# Patient Record
Sex: Male | Born: 1965 | Race: White | Hispanic: No | Marital: Single | Smoking: Current every day smoker
Health system: Southern US, Community
[De-identification: ages and names within clinical notes are randomized; demographics above are authoritative.]

## PROBLEM LIST (undated history)

## (undated) DIAGNOSIS — I428 Other cardiomyopathies: Secondary | ICD-10-CM

## (undated) DIAGNOSIS — Z87898 Personal history of other specified conditions: Secondary | ICD-10-CM

## (undated) DIAGNOSIS — F1910 Other psychoactive substance abuse, uncomplicated: Secondary | ICD-10-CM

## (undated) DIAGNOSIS — G931 Anoxic brain damage, not elsewhere classified: Secondary | ICD-10-CM

## (undated) DIAGNOSIS — R918 Other nonspecific abnormal finding of lung field: Secondary | ICD-10-CM

## (undated) DIAGNOSIS — Z8709 Personal history of other diseases of the respiratory system: Secondary | ICD-10-CM

## (undated) DIAGNOSIS — J690 Pneumonitis due to inhalation of food and vomit: Secondary | ICD-10-CM

## (undated) DIAGNOSIS — Z8674 Personal history of sudden cardiac arrest: Secondary | ICD-10-CM

## (undated) DIAGNOSIS — G2581 Restless legs syndrome: Secondary | ICD-10-CM

## (undated) HISTORY — DX: Other psychoactive substance abuse, uncomplicated: F19.10

## (undated) HISTORY — DX: Restless legs syndrome: G25.81

## (undated) HISTORY — DX: Other cardiomyopathies: I42.8

## (undated) HISTORY — DX: Personal history of other specified conditions: Z87.898

## (undated) HISTORY — DX: Anoxic brain damage, not elsewhere classified: G93.1

## (undated) HISTORY — DX: Personal history of other diseases of the respiratory system: Z87.09

## (undated) HISTORY — DX: Personal history of sudden cardiac arrest: Z86.74

## (undated) HISTORY — DX: Other nonspecific abnormal finding of lung field: R91.8

## (undated) HISTORY — DX: Pneumonitis due to inhalation of food and vomit: J69.0

---

## 1999-12-11 ENCOUNTER — Emergency Department (HOSPITAL_COMMUNITY): Admission: EM | Admit: 1999-12-11 | Discharge: 1999-12-11 | Payer: Self-pay | Admitting: Emergency Medicine

## 2000-01-01 ENCOUNTER — Emergency Department (HOSPITAL_COMMUNITY): Admission: EM | Admit: 2000-01-01 | Discharge: 2000-01-01 | Payer: Self-pay

## 2000-01-03 ENCOUNTER — Emergency Department (HOSPITAL_COMMUNITY): Admission: EM | Admit: 2000-01-03 | Discharge: 2000-01-03 | Payer: Self-pay | Admitting: Internal Medicine

## 2000-01-03 ENCOUNTER — Encounter: Payer: Self-pay | Admitting: Internal Medicine

## 2000-01-16 ENCOUNTER — Emergency Department (HOSPITAL_COMMUNITY): Admission: EM | Admit: 2000-01-16 | Discharge: 2000-01-16 | Payer: Self-pay | Admitting: Emergency Medicine

## 2000-02-11 ENCOUNTER — Encounter: Payer: Self-pay | Admitting: Emergency Medicine

## 2000-02-11 ENCOUNTER — Emergency Department (HOSPITAL_COMMUNITY): Admission: EM | Admit: 2000-02-11 | Discharge: 2000-02-11 | Payer: Self-pay | Admitting: Emergency Medicine

## 2000-04-03 ENCOUNTER — Emergency Department (HOSPITAL_COMMUNITY): Admission: EM | Admit: 2000-04-03 | Discharge: 2000-04-03 | Payer: Self-pay | Admitting: Emergency Medicine

## 2001-02-15 ENCOUNTER — Emergency Department (HOSPITAL_COMMUNITY): Admission: EM | Admit: 2001-02-15 | Discharge: 2001-02-15 | Payer: Self-pay | Admitting: Emergency Medicine

## 2009-12-14 ENCOUNTER — Emergency Department (HOSPITAL_COMMUNITY): Admission: EM | Admit: 2009-12-14 | Discharge: 2009-12-14 | Payer: Self-pay | Admitting: Family Medicine

## 2011-12-24 ENCOUNTER — Inpatient Hospital Stay (HOSPITAL_COMMUNITY): Payer: Medicaid Other

## 2011-12-24 ENCOUNTER — Inpatient Hospital Stay (HOSPITAL_COMMUNITY)
Admission: EM | Admit: 2011-12-24 | Discharge: 2012-01-19 | DRG: 004 | Disposition: A | Discharge status: Home / Self Care | Payer: Medicaid Other | Source: Ambulatory Visit | Attending: Pulmonary Disease | Admitting: Pulmonary Disease

## 2011-12-24 ENCOUNTER — Emergency Department (HOSPITAL_COMMUNITY): Payer: Medicaid Other

## 2011-12-24 ENCOUNTER — Encounter (HOSPITAL_COMMUNITY): Payer: Self-pay | Admitting: Emergency Medicine

## 2011-12-24 DIAGNOSIS — I82622 Acute embolism and thrombosis of deep veins of left upper extremity: Secondary | ICD-10-CM

## 2011-12-24 DIAGNOSIS — F1410 Cocaine abuse, uncomplicated: Secondary | ICD-10-CM

## 2011-12-24 DIAGNOSIS — A419 Sepsis, unspecified organism: Secondary | ICD-10-CM | POA: Diagnosis present

## 2011-12-24 DIAGNOSIS — F17200 Nicotine dependence, unspecified, uncomplicated: Secondary | ICD-10-CM | POA: Diagnosis present

## 2011-12-24 DIAGNOSIS — E872 Acidosis, unspecified: Secondary | ICD-10-CM

## 2011-12-24 DIAGNOSIS — D696 Thrombocytopenia, unspecified: Secondary | ICD-10-CM

## 2011-12-24 DIAGNOSIS — J690 Pneumonitis due to inhalation of food and vomit: Secondary | ICD-10-CM | POA: Diagnosis present

## 2011-12-24 DIAGNOSIS — J9600 Acute respiratory failure, unspecified whether with hypoxia or hypercapnia: Secondary | ICD-10-CM

## 2011-12-24 DIAGNOSIS — L8992 Pressure ulcer of unspecified site, stage 2: Secondary | ICD-10-CM | POA: Diagnosis present

## 2011-12-24 DIAGNOSIS — G931 Anoxic brain damage, not elsewhere classified: Secondary | ICD-10-CM

## 2011-12-24 DIAGNOSIS — L8991 Pressure ulcer of unspecified site, stage 1: Secondary | ICD-10-CM | POA: Diagnosis present

## 2011-12-24 DIAGNOSIS — R7309 Other abnormal glucose: Secondary | ICD-10-CM | POA: Diagnosis not present

## 2011-12-24 DIAGNOSIS — J189 Pneumonia, unspecified organism: Secondary | ICD-10-CM

## 2011-12-24 DIAGNOSIS — F1220 Cannabis dependence, uncomplicated: Secondary | ICD-10-CM | POA: Diagnosis present

## 2011-12-24 DIAGNOSIS — A413 Sepsis due to Hemophilus influenzae: Principal | ICD-10-CM | POA: Diagnosis present

## 2011-12-24 DIAGNOSIS — I509 Heart failure, unspecified: Secondary | ICD-10-CM | POA: Diagnosis present

## 2011-12-24 DIAGNOSIS — R4182 Altered mental status, unspecified: Secondary | ICD-10-CM

## 2011-12-24 DIAGNOSIS — E46 Unspecified protein-calorie malnutrition: Secondary | ICD-10-CM | POA: Diagnosis present

## 2011-12-24 DIAGNOSIS — F1010 Alcohol abuse, uncomplicated: Secondary | ICD-10-CM | POA: Diagnosis present

## 2011-12-24 DIAGNOSIS — R1310 Dysphagia, unspecified: Secondary | ICD-10-CM

## 2011-12-24 DIAGNOSIS — I469 Cardiac arrest, cause unspecified: Secondary | ICD-10-CM

## 2011-12-24 DIAGNOSIS — L89899 Pressure ulcer of other site, unspecified stage: Secondary | ICD-10-CM | POA: Diagnosis present

## 2011-12-24 DIAGNOSIS — I429 Cardiomyopathy, unspecified: Secondary | ICD-10-CM

## 2011-12-24 DIAGNOSIS — N179 Acute kidney failure, unspecified: Secondary | ICD-10-CM | POA: Diagnosis present

## 2011-12-24 DIAGNOSIS — D649 Anemia, unspecified: Secondary | ICD-10-CM | POA: Diagnosis present

## 2011-12-24 DIAGNOSIS — R7989 Other specified abnormal findings of blood chemistry: Secondary | ICD-10-CM

## 2011-12-24 DIAGNOSIS — E871 Hypo-osmolality and hyponatremia: Secondary | ICD-10-CM | POA: Diagnosis present

## 2011-12-24 DIAGNOSIS — L89109 Pressure ulcer of unspecified part of back, unspecified stage: Secondary | ICD-10-CM | POA: Diagnosis present

## 2011-12-24 DIAGNOSIS — I82629 Acute embolism and thrombosis of deep veins of unspecified upper extremity: Secondary | ICD-10-CM | POA: Diagnosis present

## 2011-12-24 DIAGNOSIS — F1420 Cocaine dependence, uncomplicated: Secondary | ICD-10-CM | POA: Diagnosis present

## 2011-12-24 DIAGNOSIS — L89609 Pressure ulcer of unspecified heel, unspecified stage: Secondary | ICD-10-CM | POA: Diagnosis present

## 2011-12-24 DIAGNOSIS — K7200 Acute and subacute hepatic failure without coma: Secondary | ICD-10-CM | POA: Diagnosis present

## 2011-12-24 DIAGNOSIS — G9349 Other encephalopathy: Secondary | ICD-10-CM | POA: Diagnosis present

## 2011-12-24 DIAGNOSIS — IMO0001 Reserved for inherently not codable concepts without codable children: Secondary | ICD-10-CM

## 2011-12-24 DIAGNOSIS — Z930 Tracheostomy status: Secondary | ICD-10-CM

## 2011-12-24 LAB — CBC
HCT: 47.1 % (ref 39.0–52.0)
HCT: 41.8 % (ref 39.0–52.0)
MCH: 33.5 pg (ref 26.0–34.0)
MCH: 32.9 pg (ref 26.0–34.0)
MCHC: 32.9 g/dL (ref 30.0–36.0)
MCV: 101.7 fL — ABNORMAL HIGH (ref 78.0–100.0)
MCV: 99.1 fL (ref 78.0–100.0)
Platelets: 271 10*3/uL (ref 150–400)
RDW: 13.6 % (ref 11.5–15.5)
RDW: 13.4 % (ref 11.5–15.5)
WBC: 2.4 10*3/uL — ABNORMAL LOW (ref 4.0–10.5)
WBC: 1.3 10*3/uL — CL (ref 4.0–10.5)

## 2011-12-24 LAB — URINALYSIS, ROUTINE W REFLEX MICROSCOPIC
Bilirubin Urine: NEGATIVE
Ketones, ur: NEGATIVE mg/dL
Nitrite: NEGATIVE
Specific Gravity, Urine: 1.022 (ref 1.005–1.030)
Urobilinogen, UA: 1 mg/dL (ref 0.0–1.0)
pH: 5 (ref 5.0–8.0)

## 2011-12-24 LAB — POCT I-STAT 3, ART BLOOD GAS (G3+)
Acid-base deficit: 9 mmol/L — ABNORMAL HIGH (ref 0.0–2.0)
Acid-base deficit: 7 mmol/L — ABNORMAL HIGH (ref 0.0–2.0)
Bicarbonate: 22.4 mEq/L (ref 20.0–24.0)
Bicarbonate: 20.3 mEq/L (ref 20.0–24.0)
O2 Saturation: 93 %
Patient temperature: 100
Patient temperature: 98.6
TCO2: 25 mmol/L (ref 0–100)
TCO2: 22 mmol/L (ref 0–100)
pH, Arterial: 7.09 — CL (ref 7.350–7.450)
pO2, Arterial: 77 mmHg — ABNORMAL LOW (ref 80.0–100.0)

## 2011-12-24 LAB — PROTIME-INR
INR: 1.32 (ref 0.00–1.49)
Prothrombin Time: 16.6 seconds — ABNORMAL HIGH (ref 11.6–15.2)

## 2011-12-24 LAB — COMPREHENSIVE METABOLIC PANEL
AST: 315 U/L — ABNORMAL HIGH (ref 0–37)
Albumin: 2.7 g/dL — ABNORMAL LOW (ref 3.5–5.2)
Alkaline Phosphatase: 72 U/L (ref 39–117)
BUN: 39 mg/dL — ABNORMAL HIGH (ref 6–23)
CO2: 21 mEq/L (ref 19–32)
Chloride: 106 mEq/L (ref 96–112)
GFR calc non Af Amer: 37 mL/min — ABNORMAL LOW (ref >90)
Potassium: 3.7 mEq/L (ref 3.5–5.1)
Total Bilirubin: 0.3 mg/dL (ref 0.3–1.2)

## 2011-12-24 LAB — POCT I-STAT, CHEM 8
BUN: 43 mg/dL — ABNORMAL HIGH (ref 6–23)
Chloride: 107 mEq/L (ref 96–112)
Creatinine, Ser: 2.5 mg/dL — ABNORMAL HIGH (ref 0.50–1.35)
Potassium: 4.6 mEq/L (ref 3.5–5.1)
Sodium: 140 mEq/L (ref 135–145)

## 2011-12-24 LAB — URINE MICROSCOPIC-ADD ON

## 2011-12-24 LAB — GLUCOSE, CAPILLARY
Glucose-Capillary: 105 mg/dL — ABNORMAL HIGH (ref 70–99)
Glucose-Capillary: 64 mg/dL — ABNORMAL LOW (ref 70–99)
Glucose-Capillary: 203 mg/dL — ABNORMAL HIGH (ref 70–99)
Glucose-Capillary: 69 mg/dL — ABNORMAL LOW (ref 70–99)

## 2011-12-24 LAB — LIPASE, BLOOD: Lipase: 24 U/L (ref 11–59)

## 2011-12-24 LAB — DIFFERENTIAL
Basophils Absolute: 0 10*3/uL (ref 0.0–0.1)
Basophils Relative: 0 % (ref 0–1)
Eosinophils Absolute: 0 10*3/uL (ref 0.0–0.7)
Eosinophils Relative: 0 % (ref 0–5)
Monocytes Absolute: 0.1 10*3/uL (ref 0.1–1.0)

## 2011-12-24 LAB — HEPATIC FUNCTION PANEL
Bilirubin, Direct: <0.1 mg/dL (ref 0.0–0.3)
Total Protein: 6.2 g/dL (ref 6.0–8.3)

## 2011-12-24 LAB — RAPID URINE DRUG SCREEN, HOSP PERFORMED
Amphetamines: NOT DETECTED
Opiates: NOT DETECTED

## 2011-12-24 LAB — PHOSPHORUS: Phosphorus: 1.7 mg/dL — ABNORMAL LOW (ref 2.3–4.6)

## 2011-12-24 LAB — CARDIAC PANEL(CRET KIN+CKTOT+MB+TROPI)
CK, MB: 15 ng/mL (ref 0.3–4.0)
Total CK: 1742 U/L — ABNORMAL HIGH (ref 7–232)
Troponin I: 0.78 ng/mL (ref <0.30)

## 2011-12-24 MED ORDER — CISATRACURIUM BESYLATE 10 MG/ML IV SOLN
0.5000 ug/kg/min | INTRAVENOUS | Status: AC
  Administered 2011-12-24: 1 ug/kg/min via INTRAVENOUS
  Filled 2011-12-24: qty 20

## 2011-12-24 MED ORDER — SODIUM CHLORIDE 0.9 % IV SOLN
1.0000 mg/h | INTRAVENOUS | Status: DC
  Administered 2011-12-24: 1 mg/h via INTRAVENOUS
  Administered 2011-12-25 – 2011-12-26 (×2): 2 mg/h via INTRAVENOUS
  Filled 2011-12-24 (×2): qty 10

## 2011-12-24 MED ORDER — HEPARIN SODIUM (PORCINE) 5000 UNIT/ML IJ SOLN
5000.0000 [IU] | Freq: Three times a day (TID) | INTRAMUSCULAR | Status: DC
  Administered 2011-12-25 – 2011-12-29 (×15): 5000 [IU] via SUBCUTANEOUS
  Filled 2011-12-24 (×19): qty 1

## 2011-12-24 MED ORDER — SODIUM CHLORIDE 0.9 % IV SOLN
INTRAVENOUS | Status: DC
  Administered 2011-12-24: 23:00:00 via INTRAVENOUS

## 2011-12-24 MED ORDER — SODIUM CHLORIDE 0.9 % IV SOLN
250.0000 mL | INTRAVENOUS | Status: DC | PRN
  Administered 2011-12-29: 10 mL/h via INTRAVENOUS
  Administered 2012-01-01: 500 mL via INTRAVENOUS
  Administered 2012-01-03: 20 mL/h via INTRAVENOUS

## 2011-12-24 MED ORDER — ASPIRIN 81 MG PO CHEW
324.0000 mg | CHEWABLE_TABLET | Freq: Once | ORAL | Status: AC
  Administered 2011-12-25: 324 mg
  Filled 2011-12-24: qty 4

## 2011-12-24 MED ORDER — LIDOCAINE HCL (CARDIAC) 20 MG/ML IV SOLN
INTRAVENOUS | Status: AC
  Filled 2011-12-24: qty 5

## 2011-12-24 MED ORDER — MIDAZOLAM BOLUS VIA INFUSION
2.0000 mg | INTRAVENOUS | Status: DC | PRN
  Filled 2011-12-24: qty 2

## 2011-12-24 MED ORDER — CISATRACURIUM BOLUS VIA INFUSION: 0.0500 mg/kg | Freq: Once | INTRAVENOUS | Status: AC | PRN

## 2011-12-24 MED ORDER — PANTOPRAZOLE SODIUM 40 MG PO PACK
40.0000 mg | PACK | Freq: Every day | ORAL | Status: DC
  Administered 2011-12-24 – 2012-01-10 (×17): 40 mg
  Filled 2011-12-24 (×20): qty 20

## 2011-12-24 MED ORDER — FENTANYL BOLUS VIA INFUSION
50.0000 ug | INTRAVENOUS | Status: DC | PRN
  Filled 2011-12-24: qty 50

## 2011-12-24 MED ORDER — DEXTROSE 50 % IV SOLN
INTRAVENOUS | Status: AC
  Administered 2011-12-24: 50 mL
  Filled 2011-12-24: qty 50

## 2011-12-24 MED ORDER — SODIUM CHLORIDE 0.9 % IV SOLN
1.0000 ug/kg/min | INTRAVENOUS | Status: DC
  Administered 2011-12-24 – 2011-12-26 (×2): 1 ug/kg/min via INTRAVENOUS
  Filled 2011-12-24: qty 20

## 2011-12-24 MED ORDER — ETOMIDATE 2 MG/ML IV SOLN
INTRAVENOUS | Status: AC
  Filled 2011-12-24: qty 20

## 2011-12-24 MED ORDER — SUCCINYLCHOLINE CHLORIDE 20 MG/ML IJ SOLN
INTRAMUSCULAR | Status: AC
  Filled 2011-12-24: qty 10

## 2011-12-24 MED ORDER — CHLORHEXIDINE GLUCONATE 0.12 % MT SOLN
15.0000 mL | Freq: Two times a day (BID) | OROMUCOSAL | Status: DC
  Administered 2011-12-24 – 2012-01-07 (×26): 15 mL via OROMUCOSAL
  Filled 2011-12-24 (×25): qty 15

## 2011-12-24 MED ORDER — MIDAZOLAM HCL 2 MG/2ML IJ SOLN
INTRAMUSCULAR | Status: AC
  Filled 2011-12-24: qty 2

## 2011-12-24 MED ORDER — VANCOMYCIN HCL IN DEXTROSE 1-5 GM/200ML-% IV SOLN
1000.0000 mg | Freq: Every day | INTRAVENOUS | Status: DC
  Administered 2011-12-25 – 2011-12-28 (×5): 1000 mg via INTRAVENOUS
  Filled 2011-12-24 (×6): qty 200

## 2011-12-24 MED ORDER — CISATRACURIUM BESYLATE 2 MG/ML IV SOLN: 0.1000 mg/kg | Freq: Once | INTRAVENOUS | Status: DC

## 2011-12-24 MED ORDER — NOREPINEPHRINE BITARTRATE 1 MG/ML IJ SOLN
2.0000 ug/min | INTRAVENOUS | Status: AC
  Administered 2011-12-24: 5 ug/min via INTRAVENOUS
  Administered 2011-12-25: 40 ug/min via INTRAVENOUS
  Administered 2011-12-25: 25 ug/min via INTRAVENOUS
  Administered 2011-12-25: 30 ug/min via INTRAVENOUS
  Filled 2011-12-24: qty 4

## 2011-12-24 MED ORDER — VANCOMYCIN HCL IN DEXTROSE 1-5 GM/200ML-% IV SOLN: 1000.0000 mg | Freq: Once | INTRAVENOUS | Status: DC

## 2011-12-24 MED ORDER — NALOXONE HCL 1 MG/ML IJ SOLN
INTRAMUSCULAR | Status: AC
  Filled 2011-12-24: qty 2

## 2011-12-24 MED ORDER — VECURONIUM BROMIDE 10 MG IV SOLR
INTRAVENOUS | Status: AC | PRN
  Administered 2011-12-24: 10 mg via INTRAVENOUS

## 2011-12-24 MED ORDER — BIOTENE DRY MOUTH MT LIQD
15.0000 mL | Freq: Four times a day (QID) | OROMUCOSAL | Status: DC
  Administered 2011-12-25 – 2012-01-10 (×61): 15 mL via OROMUCOSAL

## 2011-12-24 MED ORDER — MIDAZOLAM HCL 5 MG/5ML IJ SOLN
INTRAMUSCULAR | Status: AC | PRN
  Administered 2011-12-24: 2 mg via INTRAVENOUS

## 2011-12-24 MED ORDER — VECURONIUM BROMIDE 10 MG IV SOLR
INTRAVENOUS | Status: AC
  Filled 2011-12-24: qty 10

## 2011-12-24 MED ORDER — SODIUM CHLORIDE 0.9 % IV BOLUS (SEPSIS)
1000.0000 mL | Freq: Once | INTRAVENOUS | Status: AC
  Administered 2011-12-24: 1000 mL via INTRAVENOUS

## 2011-12-24 MED ORDER — SODIUM CHLORIDE 0.9 % IV SOLN
Freq: Once | INTRAVENOUS | Status: AC
  Administered 2011-12-24: 20:00:00 via INTRAVENOUS

## 2011-12-24 MED ORDER — SODIUM CHLORIDE 0.9 % IV SOLN
1.0000 mg/h | INTRAVENOUS | Status: AC
  Administered 2011-12-24: 1 mg/h via INTRAVENOUS
  Filled 2011-12-24: qty 10

## 2011-12-24 MED ORDER — SODIUM CHLORIDE 0.9 % IV SOLN
20.0000 ug/h | INTRAVENOUS | Status: AC
  Administered 2011-12-24: 25 ug/h via INTRAVENOUS
  Filled 2011-12-24: qty 50

## 2011-12-24 MED ORDER — EPINEPHRINE HCL 0.1 MG/ML IJ SOLN
INTRAMUSCULAR | Status: AC
  Filled 2011-12-24: qty 20

## 2011-12-24 MED ORDER — PIPERACILLIN SOD-TAZOBACTAM SO 2.25 (2-0.25) G IV SOLR
3.3750 g | INTRAVENOUS | Status: AC
  Administered 2011-12-24: 3.375 g via INTRAVENOUS
  Filled 2011-12-24: qty 3.38

## 2011-12-24 MED ORDER — ARTIFICIAL TEARS OP OINT
1.0000 "application " | TOPICAL_OINTMENT | Freq: Three times a day (TID) | OPHTHALMIC | Status: DC
  Administered 2011-12-25 – 2011-12-26 (×5): 1 via OPHTHALMIC
  Filled 2011-12-24: qty 3.5

## 2011-12-24 MED ORDER — ROCURONIUM BROMIDE 50 MG/5ML IV SOLN
INTRAVENOUS | Status: AC
  Filled 2011-12-24: qty 2

## 2011-12-24 MED ORDER — CISATRACURIUM BESYLATE 2 MG/ML IV SOLN: 0.1000 mg/kg | Freq: Once | INTRAVENOUS | Status: AC | PRN

## 2011-12-24 MED ORDER — SODIUM CHLORIDE 0.9 % IV SOLN
25.0000 ug/h | INTRAVENOUS | Status: DC
  Administered 2011-12-24: 25 ug/h via INTRAVENOUS
  Administered 2011-12-26: 75 ug/h via INTRAVENOUS
  Filled 2011-12-24: qty 50

## 2011-12-24 MED ORDER — PIPERACILLIN-TAZOBACTAM 3.375 G IVPB
3.3750 g | Freq: Three times a day (TID) | INTRAVENOUS | Status: DC
  Administered 2011-12-25 – 2011-12-31 (×19): 3.375 g via INTRAVENOUS
  Filled 2011-12-24 (×22): qty 50

## 2011-12-24 NOTE — Code Documentation (Signed)
Per EDP, patient to be taken to CT scanner after central line is placed.

## 2011-12-24 NOTE — ED Notes (Signed)
Patient has been placed on Arctic Sun pads prior to taking patient to CT; per admitting MD, to start [facility] machine upon arrival to 2900.  Report given to [place] on 2900; no other questions/concerns from RN; informed RN that she can call back with any questions once patient arrives to floor.  Patient to be transferred to 2900 with RNs, nurse tech, and respiratory after CT done.

## 2011-12-24 NOTE — ED Notes (Signed)
Patient being transported to CT

## 2011-12-24 NOTE — Code Documentation (Signed)
CBG 105 

## 2011-12-24 NOTE — Code Documentation (Signed)
Dr. Gilford Rile at bedside with central line cart.

## 2011-12-24 NOTE — ED Notes (Signed)
Per EMS, patient had witness seizure activity 30 minutes prior to calling EMS; upon EMS arrival, patient found unresponsive.  Patient had agonal respirations and pulseless -- initial rhythm PEA at 20 bpm.  2 epi given, 2 mg Narcan given, and 1/2 amp D50 given.  12 lead shows 1 mm of elevation.  EMS started hypothermia in field; started with iced saline.  5 mg Versed also given.  Patient now sinus tach on monitor.

## 2011-12-24 NOTE — Procedures (Signed)
Central Venous Catheter Insertion Procedure Note   Procedure: Insertion of Right Central Venous Catheter  Indications: Assessment of intravascular volume, Drug and/or fluid administration and Frequent blood sampling  Procedure Details  Consent: Unable to obtain consent because of emergent medical necessity.  Time Out: Verified patient identification, verified procedure, site/side was marked, verified correct patient position, special equipment/implants available, medications/allergies/relevent history reviewed, required imaging and test results available. Performed  Maximum sterile technique was used including antiseptics, cap, gloves, gown, hand hygiene, mask and sheet.  Skin prep: Chlorhexidine; local anesthetic administered  A antimicrobial bonded/coated triple lumen catheter was placed in the left internal jugular vein using the Seldinger technique.  Evaluation  Blood flow good  Complications: No apparent complications  Patient did tolerate procedure well.  Chest X-ray ordered to verify placement. CXR: pending.  U/S used in placement, picture in chart.  I have personally supervised the procedure. Catha Brow, MD

## 2011-12-24 NOTE — Code Documentation (Signed)
Pharmacy called about [facility] Medications; EDP ordered initial dose 2 mg IVP midazolam and continuous dose midazolam 1 mg/hr.  Asked EDP about analgesia orders; EDP does not want Fentanyl.  Pharmacy states that one cannot be given without the other; both Fentanyl and Midazolam being sent down by pharmacy.

## 2011-12-24 NOTE — Code Documentation (Signed)
Iced saline being infused.

## 2011-12-24 NOTE — Progress Notes (Signed)
PHARMACY - ANTIBIOTIC CONSULT  Initial Consult Note  Pharmacy Consult for: Vancomycin, Zosyn  Indication: rule out pneumonia   Patient Data:   Allergies: No Known Allergies  Patient Measurements: Height: 6' (182.9 cm) Weight: 154 lb 5.2 oz (70 kg) IBW/kg (Calculated) : 77.6    Vital Signs: Temp:  [98.6 F (37 C)-100.7 F (38.2 C)] 98.8 F (37.1 C) (04/21 2230) Pulse Rate:  [99-120] 99  (04/21 2245) Resp:  [20-30] 30  (04/21 2245) BP: (95-127)/(56-90) 104/65 mmHg (04/21 2220) SpO2:  [93 %-100 %] 100 % (04/21 2245) Arterial Line BP: (138-190)/(88-132) 190/132 mmHg (04/21 2230) FiO2 (%):  [100 %] 100 % (04/21 2245) Weight:  [154 lb 5.2 oz (70 kg)] 154 lb 5.2 oz (70 kg) (04/21 2220)  BMI: Estimated Body mass index is 20.93 kg/(m^2) as calculated from the following:   Height as of this encounter: 6\' 0" (1.829 m).   Weight as of this encounter: 154 lb 5.2 oz(70 kg).  Intake/Output from previous day: No intake or output data in the 24 hours ending 12/24/11 2253  Labs:  Basename 12/24/11 2025 12/24/11 2009  WBC -- 2.4*  HGB 16.3 15.5  PLT -- 271  LABCREA -- --  CREATININE 2.50* --   Estimated Creatinine Clearance: 36.6 ml/min (by C-G formula based on Cr of 2.5). No results found for this basename: VANCOTROUGH:2,VANCOPEAK:2,VANCORANDOM:2,GENTTROUGH:2,GENTPEAK:2,GENTRANDOM:2,TOBRATROUGH:2,TOBRAPEAK:2,TOBRARND:2,AMIKACINPEAK:2,AMIKACINTROU:2,AMIKACIN:2, in the last 72 hours   Microbiology: No results found for this or any previous visit (from the past 720 hour(s)).  Medical History: Past Medical History  Diagnosis Date  . No significant past medical history     Scheduled Medications:     . sodium chloride   Intravenous Once  . antiseptic oral rinse  15 mL Mouth Rinse QID  . chlorhexidine  15 mL Mouth Rinse BID  . cisatracurium (NIMBEX) infusion  0.5-10 mcg/kg/min (Order-Specific) Intravenous To Major  . dextrose      . EPINEPHrine      . fentaNYL infusion  INTRAVENOUS  20 mcg/hr Intravenous To ER  . heparin  5,000 Units Subcutaneous Q8H  . midazolam (VERSED) infusion  1 mg/hr Intravenous To ER  . midazolam      . naloxone      . norepinephrine (LEVOPHED) Adult infusion  2-50 mcg/min Intravenous To ER  . pantoprazole sodium  40 mg Per Tube Q1200  . piperacillin-tazobactam (ZOSYN)  IV  3.375 g Intravenous To Major  . sodium chloride  1,000 mL Intravenous Once  . vancomycin  1,000 mg Intravenous Once  . vecuronium        Assessment:  46 y.o. male with cardiac arrest in field, therapeutic hypothermia initiated. Pharmacy consulted to manage Zosyn and vancomycin for possible aspiration pneumonia. Confirmed with RN, that only Zosyn given in ED.   Goal of Therapy:  1. Vancomycin trough level 15-20 mcg/ml  Plan:  1. Zosyn 3.375gm IV Q8H (4 hr infusion). 2. Vancomycin 1gm IV Q24H.  Dineen Kid Thad Ranger, PharmD 12/24/2011, 10:53 PM

## 2011-12-24 NOTE — H&P (Signed)
Name: Angel [name] MRN: [id number] DOB: 08-06-1966    LOS: 0  PCCM ADMISSION NOTE  History of Present Illness:  46 YO man with no past medical history. His family states that he abuses some perscription drugs and some occasional alcohol. Smokes 2 PPD history. Daughter tried to call him all day but he was sleeping which is unusual for him. His friend found him shaking violently around 8 PM and called 911 who intubated in the field for agonal breathing. He was unresponsive at the time. He was brought to the ED and placed on hypothermia protocol. No ischemic EKG changes on ED arrival. UDS positive for cocaine and THC.   Lines / Drains: 4/21 ETT 4/21 CVC  Cultures: Blood 4/21 Urine 4/21  Antibiotics: Zosyn 4/21 Vancomycin 4/21  Tests / Events: 4/21 Brought to ED intubated in field, found unresponsive, UDS + cocaine 4/21 Started artic sun protocol.  The patient is sedated, intubated and unable to provide history, which was obtained for available medical records.    Past Medical History  Diagnosis Date  . No significant past medical history    History reviewed. No pertinent past surgical history. Prior to Admission medications   Not on File   Allergies No Known Allergies  Family History History reviewed. No pertinent family history.  Social History  does not have a smoking history on file. He does not have any smokeless tobacco history on file. His alcohol and drug histories not on file. 2 PPD for many years  Review Of Systems  Patient unable to provide  Vital Signs: Temp:  [99.9 F (37.7 C)-100.7 F (38.2 C)] 99.9 F (37.7 C) (04/21 2113) Pulse Rate:  [101-120] 107  (04/21 2113) Resp:  [20-30] 30  (04/21 2113) BP: (95-127)/(56-90) 127/90 mmHg (04/21 2113) SpO2:  [93 %-99 %] 99 % (04/21 2113) FiO2 (%):  [100 %] 100 % (04/21 2113)    Physical Examination: General:  Intubated, mechanically ventilated, no acute distress Neuro:  Sedated, synchronous,  nonfocal HEENT:  PERRL, pink conjunctivae, moist membranes Neck:  Supple, no JVD   Cardiovascular:  RRR, no M/R/G Lungs:  Bilateral diminished air entry, no W/R/R Abdomen:  Soft, nontender, nondistended, bowel sounds present Musculoskeletal:  paralyzed, no pedal edema Skin:  No rash  Ventilator settings: Vent Mode:  [-] PRVC FiO2 (%):  [100 %] 100 % Set Rate:  [30 bmp] 30 bmp Vt Set:  [086 mL] 620 mL PEEP:  [5 cmH20] 5 cmH20 Pressure Support:  [0 cmH20] 0 cmH20 Plateau Pressure:  [22 cmH20] 22 cmH20  Labs and Imaging:  Reviewed.  Please refer to the Assessment and Plan section for relevant results.  ASSESSMENT AND PLAN  NEUROLOGIC A:  Altered mental status Acute respiratory encephalopathy Drug Abuse P: -->  Versed / Fentanyl gtt to goal RASS -3 -->Paralyzed -->Artic sun -->Daily WUA -->Cocaine positive  PULMONARY  Lab 12/24/11 2040  PHART 7.090*  PCO2ART 74.7*  PO2ART 97.0  HCO3 22.4  O2SAT 93.0   A:  Acute Respiratory Failure Possible Aspiration Pneumonia P: -->  Full mechanical support -->  Goal pH>7.30, goal SpO2>92 -->  Follow up ABG -->  Follow up CXR -->  Bronchodilators -->  Daily SBT  CARDIOVASCULAR  Lab 12/24/11 2012 12/24/11 2011  TROPONINI <0.30 --  LATICACIDVEN -- 9.1*  PROBNP -- --   A:  Cardiac arrest in field - cocaine positive, likely secondary to drug overdose. Will trend enzymes and order 2 D echo. Arctic sun protocol.  P: -->  NS boluses to goal CVP>10 -->  Levophed to goal MAP>60 -->  Trend sepsis markers -->  Cardiac enzymes --> 2 D echo --> Arctic sun protocol  RENAL  Lab 12/24/11 2025  NA 140  K 4.6  CL 107  CO2 --  BUN 43*  CREATININE 2.50*  CALCIUM --  MG --  PHOS --   A:  AKI - Cr. 2.5 on admit, baseline unknown. Will watch on fluids. Likely pre-renal. P: -->  BMP -->  NS 75 cc/hr  GASTROINTESTINAL  Lab 12/24/11 2009  AST 187*  ALT 190*  ALKPHOS 106  BILITOT 0.2*  PROT 6.2  ALBUMIN 3.3*   A:  No  active issues P: -->  Continue to monitor closely, protonix for SUP.  HEMATOLOGIC  Lab 12/24/11 2025 12/24/11 2009  HGB 16.3 15.5  HCT 48.0 47.1  PLT -- 271  INR -- 1.32  APTT -- --   A:  Leukopenia - possibly related to material used to cut cocaine. Will check HIV test, hepatitis panel. P: -->  CBC in AM -->  HIV test -->Hepatitis panel  INFECTIOUS  Lab 12/24/11 2009  WBC 2.4*  PROCALCITON --   A:  Possible aspiration pneumonia - From unresponsive period P: -->  Vancomycin and Zosyn. Follow chest x-ray.  ENDOCRINE  Lab 12/24/11 2019  GLUCAP 105*   A:  No history of diabetes P: -->  CBG checks / SSI -->  Check cortisol level now.  BEST PRACTICE / DISPOSITION -->  ICU status under PCCM -->  Full code -->  NPO -->  Heparin [place] for DVT Px -->  Protonix IV for GI Px -->  Ventilator bundle -->  Family updated at bedside   Genella Mech 12/24/2011, 9:45 PM PGY-1   Patient seen with resident. I examined the patient. Labs reviewed. CT head pending. Repeat ABG better, Chest x-ray shows bilateral infiltrate possible aspiration, cont. Antibiotics. Cardiology consult for possible cardiac cath, although with positive cocaine in the urine less likely to be cathed. Rest of the plan as above.

## 2011-12-24 NOTE — Code Documentation (Signed)
Pharmacy given labeled [facility] protocol sheet filled out by EDP.

## 2011-12-24 NOTE — Code Documentation (Signed)
EDP at bedside deciding on orders from [facility] Protocol.

## 2011-12-24 NOTE — ED Notes (Signed)
Pharmacy called for [facility] medication

## 2011-12-24 NOTE — Code Documentation (Signed)
Calling report now to [phone].

## 2011-12-24 NOTE — Code Documentation (Signed)
MD at bedside inserting central line; Arctic Sun medications being started at this time.

## 2011-12-25 ENCOUNTER — Encounter (HOSPITAL_COMMUNITY): Payer: Self-pay | Admitting: *Deleted

## 2011-12-25 ENCOUNTER — Inpatient Hospital Stay (HOSPITAL_COMMUNITY): Payer: Medicaid Other

## 2011-12-25 DIAGNOSIS — F1410 Cocaine abuse, uncomplicated: Secondary | ICD-10-CM

## 2011-12-25 DIAGNOSIS — G931 Anoxic brain damage, not elsewhere classified: Secondary | ICD-10-CM

## 2011-12-25 DIAGNOSIS — R4182 Altered mental status, unspecified: Secondary | ICD-10-CM

## 2011-12-25 DIAGNOSIS — I469 Cardiac arrest, cause unspecified: Secondary | ICD-10-CM

## 2011-12-25 DIAGNOSIS — J189 Pneumonia, unspecified organism: Secondary | ICD-10-CM

## 2011-12-25 DIAGNOSIS — E872 Acidosis: Secondary | ICD-10-CM

## 2011-12-25 DIAGNOSIS — J9600 Acute respiratory failure, unspecified whether with hypoxia or hypercapnia: Secondary | ICD-10-CM

## 2011-12-25 LAB — POCT I-STAT 3, ART BLOOD GAS (G3+)
Acid-base deficit: 11 mmol/L — ABNORMAL HIGH (ref 0.0–2.0)
Acid-base deficit: 11 mmol/L — ABNORMAL HIGH (ref 0.0–2.0)
Acid-base deficit: 12 mmol/L — ABNORMAL HIGH (ref 0.0–2.0)
Acid-base deficit: 11 mmol/L — ABNORMAL HIGH (ref 0.0–2.0)
Bicarbonate: 16.6 mEq/L — ABNORMAL LOW (ref 20.0–24.0)
Bicarbonate: 16 mEq/L — ABNORMAL LOW (ref 20.0–24.0)
O2 Saturation: 85 %
O2 Saturation: 92 %
O2 Saturation: 98 %
O2 Saturation: 96 %
Patient temperature: 33
Patient temperature: 33.1
Patient temperature: 32.9
Patient temperature: 33.1
Patient temperature: 33
TCO2: 18 mmol/L (ref 0–100)
pCO2 arterial: 34.2 mmHg — ABNORMAL LOW (ref 35.0–45.0)
pH, Arterial: 7.242 — ABNORMAL LOW (ref 7.350–7.450)
pH, Arterial: 7.278 — ABNORMAL LOW (ref 7.350–7.450)
pO2, Arterial: 160 mmHg — ABNORMAL HIGH (ref 80.0–100.0)

## 2011-12-25 LAB — GLUCOSE, CAPILLARY
Glucose-Capillary: 106 mg/dL — ABNORMAL HIGH (ref 70–99)
Glucose-Capillary: 134 mg/dL — ABNORMAL HIGH (ref 70–99)
Glucose-Capillary: 180 mg/dL — ABNORMAL HIGH (ref 70–99)
Glucose-Capillary: 145 mg/dL — ABNORMAL HIGH (ref 70–99)
Glucose-Capillary: 152 mg/dL — ABNORMAL HIGH (ref 70–99)
Glucose-Capillary: 127 mg/dL — ABNORMAL HIGH (ref 70–99)
Glucose-Capillary: 129 mg/dL — ABNORMAL HIGH (ref 70–99)
Glucose-Capillary: 148 mg/dL — ABNORMAL HIGH (ref 70–99)
Glucose-Capillary: 109 mg/dL — ABNORMAL HIGH (ref 70–99)
Glucose-Capillary: 122 mg/dL — ABNORMAL HIGH (ref 70–99)

## 2011-12-25 LAB — BASIC METABOLIC PANEL
BUN: 37 mg/dL — ABNORMAL HIGH (ref 6–23)
BUN: 37 mg/dL — ABNORMAL HIGH (ref 6–23)
BUN: 38 mg/dL — ABNORMAL HIGH (ref 6–23)
BUN: 39 mg/dL — ABNORMAL HIGH (ref 6–23)
CO2: 18 mEq/L — ABNORMAL LOW (ref 19–32)
CO2: 17 mEq/L — ABNORMAL LOW (ref 19–32)
CO2: 16 mEq/L — ABNORMAL LOW (ref 19–32)
CO2: 16 mEq/L — ABNORMAL LOW (ref 19–32)
Calcium: 6.9 mg/dL — ABNORMAL LOW (ref 8.4–10.5)
Calcium: 6.8 mg/dL — ABNORMAL LOW (ref 8.4–10.5)
Calcium: 7 mg/dL — ABNORMAL LOW (ref 8.4–10.5)
Calcium: 7 mg/dL — ABNORMAL LOW (ref 8.4–10.5)
Chloride: 105 mEq/L (ref 96–112)
Chloride: 108 mEq/L (ref 96–112)
Chloride: 108 mEq/L (ref 96–112)
Creatinine, Ser: 1.21 mg/dL (ref 0.50–1.35)
Creatinine, Ser: 1.24 mg/dL (ref 0.50–1.35)
Creatinine, Ser: 1.27 mg/dL (ref 0.50–1.35)
Creatinine, Ser: 1.76 mg/dL — ABNORMAL HIGH (ref 0.50–1.35)
GFR calc Af Amer: 52 mL/min — ABNORMAL LOW (ref >90)
GFR calc Af Amer: 79 mL/min — ABNORMAL LOW (ref >90)
GFR calc non Af Amer: 45 mL/min — ABNORMAL LOW (ref >90)
GFR calc non Af Amer: 51 mL/min — ABNORMAL LOW (ref >90)
GFR calc non Af Amer: 66 mL/min — ABNORMAL LOW (ref >90)
Glucose, Bld: 164 mg/dL — ABNORMAL HIGH (ref 70–99)
Glucose, Bld: 180 mg/dL — ABNORMAL HIGH (ref 70–99)
Glucose, Bld: 146 mg/dL — ABNORMAL HIGH (ref 70–99)
Glucose, Bld: 127 mg/dL — ABNORMAL HIGH (ref 70–99)
Potassium: 2.8 mEq/L — ABNORMAL LOW (ref 3.5–5.1)
Potassium: 3.3 mEq/L — ABNORMAL LOW (ref 3.5–5.1)
Potassium: 3.8 mEq/L (ref 3.5–5.1)
Potassium: 4.3 mEq/L (ref 3.5–5.1)
Sodium: 134 mEq/L — ABNORMAL LOW (ref 135–145)
Sodium: 137 mEq/L (ref 135–145)

## 2011-12-25 LAB — CBC
HCT: 42 % (ref 39.0–52.0)
Hemoglobin: 14.3 g/dL (ref 13.0–17.0)
MCV: 96.3 fL (ref 78.0–100.0)
RBC: 4.36 MIL/uL (ref 4.22–5.81)
WBC: 1.6 10*3/uL — ABNORMAL LOW (ref 4.0–10.5)

## 2011-12-25 LAB — CARDIAC PANEL(CRET KIN+CKTOT+MB+TROPI): Relative Index: 1.6 (ref 0.0–2.5)

## 2011-12-25 LAB — CORTISOL: Cortisol, Plasma: 53.9 ug/dL

## 2011-12-25 LAB — PROTIME-INR: Prothrombin Time: 18.4 seconds — ABNORMAL HIGH (ref 11.6–15.2)

## 2011-12-25 MED ORDER — POTASSIUM PHOSPHATE DIBASIC 3 MMOLE/ML IV SOLN
20.0000 mmol | Freq: Once | INTRAVENOUS | Status: AC
  Administered 2011-12-25: 20 mmol via INTRAVENOUS
  Filled 2011-12-25: qty 6.67

## 2011-12-25 MED ORDER — SODIUM CHLORIDE 0.9 % IV BOLUS (SEPSIS)
500.0000 mL | Freq: Once | INTRAVENOUS | Status: AC
  Administered 2011-12-25: 500 mL via INTRAVENOUS

## 2011-12-25 MED ORDER — SODIUM PHOSPHATE 3 MMOLE/ML IV SOLN
30.0000 mmol | Freq: Once | INTRAVENOUS | Status: AC
  Administered 2011-12-25: 30 mmol via INTRAVENOUS
  Filled 2011-12-25: qty 10

## 2011-12-25 MED ORDER — SODIUM CHLORIDE 0.9 % IV SOLN
INTRAVENOUS | Status: DC
  Administered 2012-01-01: 500 mL via INTRAVENOUS
  Administered 2012-01-03: 5 mL/h via INTRAVENOUS
  Administered 2012-01-07: 23:00:00 via INTRAVENOUS

## 2011-12-25 MED ORDER — DEXTROSE 5 % IV SOLN
2.0000 ug/min | INTRAVENOUS | Status: DC
  Administered 2011-12-25: 40 ug/min via INTRAVENOUS
  Administered 2011-12-25: 32 ug/min via INTRAVENOUS
  Administered 2011-12-25: 40 ug/min via INTRAVENOUS
  Administered 2011-12-26: 20 ug/min via INTRAVENOUS
  Administered 2011-12-29: 5 ug/min via INTRAVENOUS
  Administered 2011-12-29: 8 ug/min via INTRAVENOUS
  Filled 2011-12-25 (×5): qty 16

## 2011-12-25 MED ORDER — POTASSIUM CHLORIDE 20 MEQ/15ML (10%) PO LIQD
40.0000 meq | Freq: Once | ORAL | Status: AC
  Administered 2011-12-25: 40 meq
  Filled 2011-12-25: qty 30

## 2011-12-25 MED ORDER — MAGNESIUM SULFATE BOLUS VIA INFUSION
2.0000 g | Freq: Once | INTRAVENOUS | Status: DC
  Filled 2011-12-25 (×2): qty 500

## 2011-12-25 MED ORDER — MAGNESIUM SULFATE 40 MG/ML IJ SOLN
2.0000 g | Freq: Once | INTRAMUSCULAR | Status: AC
  Administered 2011-12-25: 2 g via INTRAVENOUS
  Filled 2011-12-25: qty 50

## 2011-12-25 MED ORDER — POTASSIUM CHLORIDE 10 MEQ/50ML IV SOLN
10.0000 meq | INTRAVENOUS | Status: AC
  Administered 2011-12-25 (×3): 10 meq via INTRAVENOUS
  Filled 2011-12-25 (×3): qty 50

## 2011-12-25 NOTE — Progress Notes (Signed)
INITIAL ADULT NUTRITION ASSESSMENT Date: 12/25/2011   Time: 9:42 AM Reason for Assessment: Consult  ASSESSMENT: Male 46 y.o.  Dx: VDRF  Hx:  Past Medical History  Diagnosis Date  . No significant past medical history     Related Meds:     . sodium chloride   Intravenous Once  . antiseptic oral rinse  15 mL Mouth Rinse QID  . artificial tears  1 application Both Eyes Q8H  . aspirin  324 mg Per Tube Once  . chlorhexidine  15 mL Mouth Rinse BID  . cisatracurium (NIMBEX) infusion  0.5-10 mcg/kg/min (Order-Specific) Intravenous To Major  . cisatracurium  0.1 mg/kg Intravenous Once  . dextrose      . EPINEPHrine      . fentaNYL infusion INTRAVENOUS  20 mcg/hr Intravenous To ER  . heparin  5,000 Units Subcutaneous Q8H  . midazolam (VERSED) infusion  1 mg/hr Intravenous To ER  . midazolam      . naloxone      . norepinephrine (LEVOPHED) Adult infusion  2-50 mcg/min Intravenous To ER  . pantoprazole sodium  40 mg Per Tube Q1200  . piperacillin-tazobactam (ZOSYN)  IV  3.375 g Intravenous To Major  . piperacillin-tazobactam (ZOSYN)  IV  3.375 g Intravenous Q8H  . potassium chloride  10 mEq Intravenous Q1 Hr x 3  . potassium chloride  40 mEq Per Tube Once  . potassium phosphate IVPB (mmol)  20 mmol Intravenous Once  . sodium chloride  1,000 mL Intravenous Once  . sodium chloride  500 mL Intravenous Once  . sodium chloride  500 mL Intravenous Once  . vancomycin  1,000 mg Intravenous QHS  . vecuronium      . DISCONTD: vancomycin  1,000 mg Intravenous Once     Ht: 6' (182.9 cm)  Wt: 162 lb 0.6 oz (73.5 kg)  Ideal Wt: 81 kg % Ideal Wt: 91%  Usual Wt: Unable to obtain % Usual Wt: ---  Body mass index is 21.98 kg/(m^2).  WNL  Food/Nutrition Related Hx: Pre nutrition risk assessment, family states he has problems chewing/swallowing and that he doesn't eat much.   Labs:  CMP     Component Value Date/Time   NA 134* 12/25/2011 0800   K 4.3 12/25/2011 0800   CL 107  12/25/2011 0800   CO2 17* 12/25/2011 0800   GLUCOSE 180* 12/25/2011 0800   BUN 37* 12/25/2011 0800   CREATININE 1.27 12/25/2011 0800   CALCIUM 6.8* 12/25/2011 0800   PROT 5.0* 12/24/2011 2147   ALBUMIN 2.7* 12/24/2011 2147   AST 315* 12/24/2011 2147   ALT 341* 12/24/2011 2147   ALKPHOS 72 12/24/2011 2147   BILITOT 0.3 12/24/2011 2147   GFRNONAA 66* 12/25/2011 0800   GFRAA 77* 12/25/2011 0800     Intake/Output Summary (Last 24 hours) at 12/25/11 1002 Last data filed at 12/25/11 0900  Gross per 24 hour  Intake 6204.01 ml  Output   1875 ml  Net 4329.01 ml      Diet Order: NPO  Supplements/Tube Feeding: none  IVF:    sodium chloride Last Rate: 75 mL/hr at 12/24/11 2300  cisatracurium (NIMBEX) infusion Last Rate: 1 mcg/kg/min (12/24/11 2300)  fentaNYL infusion INTRAVENOUS Last Rate: 75 mcg/hr (12/25/11 0300)  midazolam (VERSED) infusion Last Rate: 2 mg/hr (12/25/11 0400)  norepinephrine (LEVOPHED) Adult infusion Last Rate: 40 mcg/min (12/25/11 0800)    Estimated Nutritional Needs:   Kcal: 2052 Protein: 109-130 gm  Fluid:   > 2 L  Pt is intubated/sedated on vent. Hypothermia protocol has been started. Currently 91.6 F (33.1 C). Family stats that the pt has always been thin, a small eater. Does not this he has had any recent weight loss.  Recommend that if pt is expected to remain intubated for > 24-48 hrs after rewarmed, initiate TF.    NUTRITION DIAGNOSIS: -Inadequate oral intake (NI-2.1).  Status: Ongoing  RELATED TO: inability to eat  AS EVIDENCE BY:  NPO, vent status  MONITORING/EVALUATION(Goals): Goal: TF will be initiated with in 48 hours if expected to remain intubated.  Monitor: rewarming, vent status, weight, labs, I/O's  EDUCATION NEEDS: -No education needs identified at this time  INTERVENTION: 1. If TF is desired, recommend using Osmolite 1.2 with a goal rate of 65 ml/hr and 30 ml Pro-stat BID.  This will provide 2072 kcal, 117 gm protein and 1279 ml free  water.  2. RD will continue to follow.   Dietitian [phone]  DOCUMENTATION CODES Per approved criteria  -Not Applicable    Angel [name] 12/25/2011, 9:42 AM

## 2011-12-25 NOTE — Progress Notes (Signed)
eLink Physician-Brief Progress Note Patient Name: Angel [name] DOB: 1966/06/25 MRN: [id number]  Date of Service  12/25/2011   HPI/Events of Note     eICU Interventions  Initiate ARDS protocol -watch pH   Intervention Category Major Interventions: Hypoxemia - evaluation and management  [name] V. 12/25/2011, 4:40 AM

## 2011-12-25 NOTE — Consult Note (Signed)
CONSULT NOTE  Date: 12/25/2011               Patient Name:  Angel [name] MRN: [id number]  DOB: [birth year] Age / Sex: 46 y.o., male        PCP: No primary provider on file. Primary Cardiologist: none            Referring Physician: Dr. Molli Knock              Reason for Consult: Cardiac arrest / PEA arrest           History of Present Illness: Patient is a 46 y.o. male with a PMHx of drug abuse, who was admitted to [facility] on 12/24/2011 for evaluation of cardiac arrest.  The patient was apparently in a motel room doing drugs. His friend found him to be unconscious. EMS was called and they found him to be in pulseless electrical activity arrest.  ACLS was started and the patient was resuscitated. The patient was brought to the closest emergency room and was admitted by the pulmonary critical care team.  Hypothermic cooling was initiated in the field with the infusion of iced saline. He continues on the [employer] protocol currently.  The patient is sedated and is on the ventilator. No family members are present. No further history is available.   Medications: Outpatient medications: No prescriptions prior to admission    Current medications: Current Facility-Administered Medications  Medication Dose Route Frequency Provider Last Rate Last Dose  . 0.9 %  sodium chloride infusion   Intravenous Once Consuello Masse, MD 125 mL/hr at 12/24/11 2027    . 0.9 %  sodium chloride infusion  250 mL Intravenous PRN Darnelle Maffucci, MD 10 mL/hr at 12/25/11 0000 250 mL at 12/25/11 0000  . 0.9 %  sodium chloride infusion   Intravenous Continuous Darnelle Maffucci, MD 75 mL/hr at 12/24/11 2300    . 0.9 %  sodium chloride infusion   Intravenous Continuous Alyson Reedy, MD 25 mL/hr at 12/25/11 0947    . antiseptic oral rinse (BIOTENE) solution 15 mL  15 mL Mouth Rinse QID Rolan Bucco, MD   15 mL at 12/25/11 1200  . artificial tears (LACRILUBE) ophthalmic ointment 1 application  1 application Both Eyes  Q8H Oretha Milch, MD   1 application at 12/25/11 1332  . aspirin chewable tablet 324 mg  324 mg Per Tube Once Oretha Milch, MD   324 mg at 12/25/11 0009  . chlorhexidine (PERIDEX) 0.12 % solution 15 mL  15 mL Mouth Rinse BID Rolan Bucco, MD   15 mL at 12/25/11 0843  . cisatracurium (NIMBEX) 200 mg in sodium chloride 0.9 % 200 mL infusion  0.5-10 mcg/kg/min (Order-Specific) Intravenous To Major Rolan Bucco, MD 4.2 mL/hr at 12/24/11 2100 1 mcg/kg/min at 12/24/11 2100  . cisatracurium (NIMBEX) injection 7 mg  0.1 mg/kg Intravenous Once Oretha Milch, MD       And  . cisatracurium (NIMBEX) injection 7 mg  0.1 mg/kg Intravenous Once PRN Oretha Milch, MD       And  . cisatracurium (NIMBEX) 200 mg in sodium chloride 0.9 % 200 mL infusion  1-1.5 mcg/kg/min Intravenous Continuous Oretha Milch, MD 4.2 mL/hr at 12/24/11 2300 1 mcg/kg/min at 12/24/11 2300   And  . cisatracurium (NIMBEX) bolus via infusion 3.5 mg  0.05 mg/kg Intravenous Once PRN Oretha Milch, MD      . dextrose 50 % solution  50 mL at 12/24/11 2234  . EPINEPHrine (ADRENALIN) 0.1 MG/ML injection           . fentaNYL (SUBLIMAZE) 10 mcg/mL in sodium chloride 0.9 % 250 mL infusion  20 mcg/hr Intravenous To ER Rolan Bucco, MD 2.5 mL/hr at 12/24/11 2100 25 mcg/hr at 12/24/11 2100  . fentaNYL (SUBLIMAZE) 10 mcg/mL in sodium chloride 0.9 % 250 mL infusion  25-300 mcg/hr Intravenous Continuous Oretha Milch, MD 7.5 mL/hr at 12/25/11 0300 75 mcg/hr at 12/25/11 0300  . fentaNYL (SUBLIMAZE) bolus via infusion 50 mcg  50 mcg Intravenous Q15 min PRN Oretha Milch, MD      . heparin injection 5,000 Units  5,000 Units Subcutaneous Q8H Darnelle Maffucci, MD   5,000 Units at 12/25/11 1332  . magnesium sulfate IVPB 2 g 50 mL  2 g Intravenous Once Orbie Hurst, MD      . midazolam (VERSED) 1 mg/mL in sodium chloride 0.9 % 50 mL infusion  1 mg/hr Intravenous To ER Rolan Bucco, MD 1 mL/hr at 12/24/11 2100 1 mg/hr at 12/24/11 2100  . midazolam (VERSED)  1 mg/mL in sodium chloride 0.9 % 50 mL infusion  1-10 mg/hr Intravenous Continuous Oretha Milch, MD 2 mL/hr at 12/25/11 0400 2 mg/hr at 12/25/11 0400  . midazolam (VERSED) 2 MG/2ML injection           . midazolam (VERSED) 5 MG/5ML injection   Intravenous PRN Rolan Bucco, MD   2 mg at 12/24/11 2014  . midazolam (VERSED) bolus via infusion 2 mg  2 mg Intravenous Q30 min PRN Oretha Milch, MD      . naloxone [facility]) 1 MG/ML injection           . norepinephrine (LEVOPHED) 16,000 mcg in dextrose 5 % 250 mL infusion  2-50 mcg/min Intravenous Titrated Oretha Milch, MD 28.1 mL/hr at 12/25/11 1330 30 mcg/min at 12/25/11 1330  . norepinephrine (LEVOPHED) 4,000 mcg in dextrose 5 % 250 mL infusion  2-50 mcg/min Intravenous To ER Rolan Bucco, MD 150 mL/hr at 12/25/11 0100 40 mcg/min at 12/25/11 0100  . pantoprazole sodium (PROTONIX) 40 mg/20 mL oral suspension 40 mg  40 mg Per Tube Q1200 Darnelle Maffucci, MD   40 mg at 12/25/11 1130  . piperacillin-tazobactam (ZOSYN) 3.375 g in dextrose 5 % 50 mL IVPB  3.375 g Intravenous To Major Consuello Masse, MD   3.375 g at 12/24/11 2136  . piperacillin-tazobactam (ZOSYN) IVPB 3.375 g  3.375 g Intravenous Q8H Orbie Hurst, MD   3.375 g at 12/25/11 1331  . potassium chloride 10 mEq in 50 mL *CENTRAL LINE* IVPB  10 mEq Intravenous Q1 Hr x 3 Oretha Milch, MD   10 mEq at 12/25/11 0700  . potassium chloride 20 MEQ/15ML (10%) liquid 40 mEq  40 mEq Per Tube Once Oretha Milch, MD   40 mEq at 12/25/11 0116  . potassium phosphate 20 mmol in dextrose 5 % 500 mL infusion  20 mmol Intravenous Once Oretha Milch, MD   20 mmol at 12/25/11 0148  . sodium chloride 0.9 % bolus 1,000 mL  1,000 mL Intravenous Once Consuello Masse, MD   1,000 mL at 12/24/11 2026  . sodium chloride 0.9 % bolus 500 mL  500 mL Intravenous Once Oretha Milch, MD   500 mL at 12/25/11 0100  . sodium chloride 0.9 % bolus 500 mL  500 mL Intravenous Once Oretha Milch, MD   500 mL  at 12/25/11 0215  . sodium phosphate  30 mmol in dextrose 5 % 250 mL infusion  30 mmol Intravenous Once Alyson Reedy, MD   30 mmol at 12/25/11 1300  . vancomycin (VANCOCIN) IVPB 1000 mg/200 mL premix  1,000 mg Intravenous QHS Orbie Hurst, MD   1,000 mg at 12/25/11 0000  . vecuronium (NORCURON) 10 MG injection           . vecuronium (NORCURON) injection   Intravenous PRN Rolan Bucco, MD   10 mg at 12/24/11 2013  . DISCONTD: etomidate (AMIDATE) 2 MG/ML injection           . DISCONTD: lidocaine (cardiac) 100 mg/5ml (XYLOCAINE) 20 MG/ML injection 2%           . DISCONTD: magnesium bolus via infusion 2 g  2 g Intravenous Once Alyson Reedy, MD      . DISCONTD: rocuronium (ZEMURON) 50 MG/5ML injection           . DISCONTD: succinylcholine (ANECTINE) 20 MG/ML injection           . DISCONTD: vancomycin (VANCOCIN) IVPB 1000 mg/200 mL premix  1,000 mg Intravenous Once Consuello Masse, MD         No Known Allergies   Past Medical History  Diagnosis Date  . No significant past medical history     History reviewed. No pertinent past surgical history.  History reviewed. No pertinent family history.  Social History:  reports that he has been smoking Cigarettes.  He has a 66 pack-year smoking history. He does not have any smokeless tobacco history on file. He reports that he uses illicit drugs (Cocaine and Marijuana). His alcohol history not on file.   Review of Systems: Review of systems was not obtainable. The patient is unable to communicate.  Physical Exam: BP 115/82  Pulse 65  Temp(Src) 91.8 F (33.2 C) (Core (Comment))  Resp 17  Ht 6' (1.829 m)  Wt 162 lb 0.6 oz (73.5 kg)  BMI 21.98 kg/m2  SpO2 99%  General: Vital signs reviewed and noted.   Head: Normocephalic, atraumatic, sclera anicteric, mucus membranes are moist  Neck: Supple. Negative for carotid bruits. JVD not elevated.  Lungs:   the patient is currently on the vent.   Heart: RRR with S1 S2. No murmurs, rubs, or gallops appreciated.  Abdomen:  Soft,  non-tender, non-distended with normoactive bowel sounds. No hepatomegaly. No rebound/guarding. No obvious abdominal masses  MSK: Strength and the appear normal for age.  Extremities: No clubbing or cyanosis. No edema.  Distal pedal pulses are 2+ and equal bilaterally.  Neurologic:  the patient is sedated and is on the ventilator.   Psych:  unable to assess.     Lab results: Basic Metabolic Panel:  Lab 12/25/11 1610 12/25/11 0800 12/25/11 0355 12/24/11 2147  NA 136 134* 136 --  K 3.6 4.3 3.3* --  CL 108 107 105 --  CO2 16* 17* 17* --  GLUCOSE 148* 180* 164* --  BUN 37* 37* 38* --  CREATININE 1.24 1.27 1.41* --  CALCIUM 7.0* 6.8* 6.7* --  MG -- -- -- 1.5  PHOS -- -- -- 1.7*    Liver Function Tests:  Lab 12/24/11 2147 12/24/11 2009  AST 315* 187*  ALT 341* 190*  ALKPHOS 72 106  BILITOT 0.3 0.2*  PROT 5.0* 6.2  ALBUMIN 2.7* 3.3*    Lab 12/24/11 2009  LIPASE 24  AMYLASE --   No results found for this  basename: AMMONIA:3 in the last 168 hours  CBC:  Lab 12/25/11 0400 2012/01/22 2147 2012-01-22 2025 Jan 22, 2012 2009  WBC 1.6* 1.3* -- 2.4*  NEUTROABS -- -- -- 1.4*  HGB 14.3 13.9 16.3 --  HCT 42.0 41.8 48.0 --  MCV 96.3 99.1 -- 101.7*  PLT 142* 155 -- 271    Cardiac Enzymes:  Lab 12/25/11 0600 Jan 22, 2012 2149 2012/01/22 2012  CKTOTAL 3071* 1742* --  CKMB 30.1* 15.0* --  CKMBINDEX -- -- --  TROPONINI 1.47* 0.78* <0.30    BNP: No components found with this basename: POCBNP:3  CBG:  Lab 12/25/11 1138 12/25/11 1003 12/25/11 0812 12/25/11 0557 12/25/11 0514  GLUCAP 148* 127* 177* 145* 152*    Coagulation Studies:  Basename 12/25/11 0355 01-22-2012 2009  LABPROT 18.4* 16.6*  INR 1.50* 1.32     Telemetry: Normal sinus rhythm  Imaging: Ct Head Wo Contrast  2012-01-22  *RADIOLOGY REPORT*  Clinical Data: Ventilated, post CPR, altered mental status  CT HEAD WITHOUT CONTRAST  Technique:  Contiguous axial images were obtained from the base of the skull through the vertex  without contrast.  Comparison: Report of prior non digital study 01/03/2000 is reviewed.  Findings: No acute hemorrhage, acute infarction, or mass lesion is identified.  No midline shift.  No ventriculomegaly.  No skull fracture.  Bilateral maxillary sinus air fluid levels are noted. Partial opacification of the ethmoid and sphenoid sinuses.  Orbits are unremarkable.  IMPRESSION: No acute intracranial finding.  Sinusitis.  Original Report Authenticated By: Harrel Lemon, M.D.   Dg Chest Port 1 View  12/25/2011  *RADIOLOGY REPORT*  Clinical Data: Evaluate endotracheal tube  PORTABLE CHEST - 1 VIEW  Comparison: 01-22-12;  Findings: Grossly unchanged cardiac silhouette and mediastinal contours.  Stable positioning of support apparatus including endotracheal tube overlying tracheal air column with tip approximately 7.6 cm from the carina.  No pneumothorax.  No change to minimal increase in consolidation of right upper and mid lung airspace opacities.  The left hemithorax is unchanged.  No new focal airspace opacities.  No definite pleural effusion.  Grossly unchanged bones.  IMPRESSION: 1.  Stable positioning of support apparatus.  No pneumothorax. 2.  No change to minimal increase in consolidation of air space opacities involving the right upper and mid lung, with differential considerations including infection, aspiration or less likely pulmonary hemorrhage.  Original Report Authenticated By: Waynard Reeds, M.D.   Dg Chest Port 1 View  01-22-12  *RADIOLOGY REPORT*  Clinical Data: Central line placement.  PORTABLE CHEST - 1 VIEW  Comparison: Jan 22, 2012 at 2054 hours  Findings: Endotracheal tube tip measures about 5.6 cm above the carina.  Interval placement of right central venous catheter with tip over the mid SVC region.  No pneumothorax.  Bilateral perihilar airspace infiltrates with slight improvement since previous study. No blunting of costophrenic angles.  Normal heart size and pulmonary  vascularity.  IMPRESSION: Right central venous catheter placed with tip over the mid SVC region.  No pneumothorax.  Endotracheal and enteric tubes remain in place.  Bilateral perihilar and upper lung airspace disease.  Original Report Authenticated By: Marlon Pel, M.D.   Dg Chest Portable 1 View  01/22/2012  *RADIOLOGY REPORT*  Clinical Data: The patient found unresponsive.  Status post CPR.  PORTABLE CHEST - 1 VIEW  Comparison: None.  Findings: Shallow inspiration.  Normal heart size.  Diffuse bilateral perihilar and upper lobe airspace disease.  Changes could represent infection, aspiration, edema, or ARDS.  No  evidence of pleural effusion or pneumothorax.  Endotracheal tube is in place with tip about 4.9 cm above the carina.  An enteric tube is in place with tip not visualized but below the level of the left hemidiaphragm consistent with location at least in the stomach.  No obvious displaced rib fractures.  IMPRESSION: Endotracheal tube tip is 4.9 cm above the carina.  Enteric tube tip is not visualized but is below the left hemidiaphragm.  Bilateral perihilar and upper lung airspace disease.  Original Report Authenticated By: Marlon Pel, M.D.       Assessment & Plan:  1. Cardiac arrest: The patient was found in PEA arrest. He has been resuscitated and is now on the [facility] protocol. An echocardiogram has been performed.  Results not back yet.  I'll look at the echo is results today.  There has been some discussion between Dr. Molli Knock and the family about how aggressive to be in his treatment.  Continue supportive care. No additional recommendations.    Vesta Mixer, Montez Hageman., MD, [facility] 12/25/2011, 2:07 PM

## 2011-12-25 NOTE — Progress Notes (Signed)
CRITICAL VALUE ALERT  Critical value received:  WBC 1.3; CKMB 15 Trop 0.78  Date of notification:  12/24/11  Time of notification:  2340  Critical value read back:yes  Nurse who received alert:  C.Lache Dagher RN  MD notified (1st page):  Vassie Loll   Time of first page:  0005  MD notified (2nd page):  Time of second page:  Responding MD:  Vassie Loll  Time MD responded:  0005

## 2011-12-25 NOTE — ED Provider Notes (Signed)
History     CSN: [id number]  Arrival date & time 12/24/11  1959   First MD Initiated Contact with Patient 12/24/11 2007      No chief complaint on file.    Patient is a 46 y.o. male presenting with altered mental status. The history is provided by the EMS personnel.  Altered Mental Status This is a new problem. The current episode started today. The problem occurs constantly. The problem has been unchanged. Associated symptoms comments: Pt unconscious.. Exacerbated by: Pt unconscious. Treatments tried: Pt unconscious. Improvement on treatment: Pt unconscious.    Past Medical History  Diagnosis Date  . No significant past medical history     History reviewed. No pertinent past surgical history.  History reviewed. No pertinent family history.  History  Substance Use Topics  . Smoking status: Not on file  . Smokeless tobacco: Not on file  . Alcohol Use:       Review of Systems  Unable to perform ROS: Intubated  Psychiatric/Behavioral: Positive for altered mental status.    Allergies  Review of patient's allergies indicates no known allergies.  Home Medications  No current outpatient prescriptions on file.  BP 96/77  Pulse 67  Temp(Src) 91.6 F (33.1 C) (Core (Comment))  Resp 30  Ht 6' (1.829 m)  Wt 154 lb 5.2 oz (70 kg)  BMI 20.93 kg/m2  SpO2 100%  Physical Exam  Constitutional: He appears well-developed and well-nourished. No distress.  HENT:  Head: Normocephalic and atraumatic.  Eyes: Conjunctivae are normal. Pupils are equal, round, and reactive to light. No scleral icterus.  Neck: Normal range of motion. Neck supple. No JVD present.  Cardiovascular: Normal rate, regular rhythm, normal heart sounds and intact distal pulses.   No murmur heard. Pulmonary/Chest: Effort normal. No stridor. No respiratory distress. He has no wheezes. He has rales.  Abdominal: Soft. Bowel sounds are normal. He exhibits no distension and no mass. There is no rebound and no  guarding.  Musculoskeletal: Normal range of motion. He exhibits no edema and no tenderness.  Neurological: He is unresponsive. GCS eye subscore is 1. GCS verbal subscore is 1. GCS motor subscore is 2.  Skin: Skin is warm and dry. He is not diaphoretic.  Psychiatric: He has a normal mood and affect.    ED Course  Procedures (including critical care time)  Labs Reviewed  CBC - Abnormal; Notable for the following:    WBC 2.4 (*)    MCV 101.7 (*)    All other components within normal limits  DIFFERENTIAL - Abnormal; Notable for the following:    Neutro Abs 1.4 (*)    All other components within normal limits  HEPATIC FUNCTION PANEL - Abnormal; Notable for the following:    Albumin 3.3 (*)    AST 187 (*)    ALT 190 (*)    Total Bilirubin 0.2 (*)    All other components within normal limits  LACTIC ACID, PLASMA - Abnormal; Notable for the following:    Lactic Acid, Venous 9.1 (*)    All other components within normal limits  PROTIME-INR - Abnormal; Notable for the following:    Prothrombin Time 16.6 (*)    All other components within normal limits  URINALYSIS, ROUTINE W REFLEX MICROSCOPIC - Abnormal; Notable for the following:    APPearance CLOUDY (*)    Hgb urine dipstick MODERATE (*)    All other components within normal limits  URINE RAPID DRUG SCREEN (HOSP PERFORMED) - Abnormal; Notable for  the following:    Cocaine POSITIVE (*)    Tetrahydrocannabinol POSITIVE (*)    All other components within normal limits  GLUCOSE, CAPILLARY - Abnormal; Notable for the following:    Glucose-Capillary 105 (*)    All other components within normal limits  POCT I-STAT, CHEM 8 - Abnormal; Notable for the following:    BUN 43 (*)    Creatinine, Ser 2.50 (*)    Glucose, Bld 124 (*)    Calcium, Ion 1.02 (*)    All other components within normal limits  POCT I-STAT 3, BLOOD GAS (G3+) - Abnormal; Notable for the following:    pH, Arterial 7.090 (*)    pCO2 arterial 74.7 (*)    Acid-base  deficit 9.0 (*)    All other components within normal limits  POCT I-STAT 3, BLOOD GAS (G3+) - Abnormal; Notable for the following:    pH, Arterial 7.238 (*)    pCO2 arterial 47.6 (*)    pO2, Arterial 77.0 (*)    Acid-base deficit 7.0 (*)    All other components within normal limits  COMPREHENSIVE METABOLIC PANEL - Abnormal; Notable for the following:    BUN 39 (*)    Creatinine, Ser 2.04 (*)    Calcium 6.9 (*)    Total Protein 5.0 (*)    Albumin 2.7 (*)    AST 315 (*)    ALT 341 (*)    GFR calc non Af Amer 37 (*)    GFR calc Af Amer 43 (*)    All other components within normal limits  PHOSPHORUS - Abnormal; Notable for the following:    Phosphorus 1.7 (*)    All other components within normal limits  CARDIAC PANEL(CRET KIN+CKTOT+MB+TROPI) - Abnormal; Notable for the following:    Total CK 1742 (*)    CK, MB 15.0 (*)    Troponin I 0.78 (*)    All other components within normal limits  CBC - Abnormal; Notable for the following:    WBC 1.3 (*)    All other components within normal limits  GLUCOSE, CAPILLARY - Abnormal; Notable for the following:    Glucose-Capillary 64 (*)    All other components within normal limits  GLUCOSE, CAPILLARY - Abnormal; Notable for the following:    Glucose-Capillary 69 (*)    All other components within normal limits  GLUCOSE, CAPILLARY - Abnormal; Notable for the following:    Glucose-Capillary 203 (*)    All other components within normal limits  BASIC METABOLIC PANEL - Abnormal; Notable for the following:    Potassium 2.8 (*) DELTA CHECK NOTED   Glucose, Bld 123 (*)    BUN 39 (*)    Creatinine, Ser 1.76 (*)    Calcium 7.2 (*)    GFR calc non Af Amer 45 (*)    GFR calc Af Amer 52 (*)    All other components within normal limits  LIPASE, BLOOD  TROPONIN I  URINE MICROSCOPIC-ADD ON  PROCALCITONIN  MAGNESIUM  MRSA PCR SCREENING  URINE CULTURE  CULTURE, BLOOD (ROUTINE X 2)  CULTURE, BLOOD (ROUTINE X 2)  HIV ANTIBODY (ROUTINE TESTING)    HEPATITIS PANEL, ACUTE  CORTISOL  CARDIAC PANEL(CRET KIN+CKTOT+MB+TROPI)  CBC  CULTURE, RESPIRATORY  BASIC METABOLIC PANEL  PROTIME-INR  APTT  BASIC METABOLIC PANEL  BASIC METABOLIC PANEL  CARDIAC PANEL(CRET KIN+CKTOT+MB+TROPI)  BASIC METABOLIC PANEL  BASIC METABOLIC PANEL  BASIC METABOLIC PANEL   Ct Head Wo Contrast  12/24/2011  *RADIOLOGY REPORT*  Clinical  Data: Ventilated, post CPR, altered mental status  CT HEAD WITHOUT CONTRAST  Technique:  Contiguous axial images were obtained from the base of the skull through the vertex without contrast.  Comparison: Report of prior non digital study 01/03/2000 is reviewed.  Findings: No acute hemorrhage, acute infarction, or mass lesion is identified.  No midline shift.  No ventriculomegaly.  No skull fracture.  Bilateral maxillary sinus air fluid levels are noted. Partial opacification of the ethmoid and sphenoid sinuses.  Orbits are unremarkable.  IMPRESSION: No acute intracranial finding.  Sinusitis.  Original Report Authenticated By: Harrel Lemon, M.D.   Dg Chest Port 1 View  12/24/2011  *RADIOLOGY REPORT*  Clinical Data: Central line placement.  PORTABLE CHEST - 1 VIEW  Comparison: 12/24/2011 at 2054 hours  Findings: Endotracheal tube tip measures about 5.6 cm above the carina.  Interval placement of right central venous catheter with tip over the mid SVC region.  No pneumothorax.  Bilateral perihilar airspace infiltrates with slight improvement since previous study. No blunting of costophrenic angles.  Normal heart size and pulmonary vascularity.  IMPRESSION: Right central venous catheter placed with tip over the mid SVC region.  No pneumothorax.  Endotracheal and enteric tubes remain in place.  Bilateral perihilar and upper lung airspace disease.  Original Report Authenticated By: Marlon Pel, M.D.   Dg Chest Portable 1 View  12/24/2011  *RADIOLOGY REPORT*  Clinical Data: The patient found unresponsive.  Status post CPR.  PORTABLE  CHEST - 1 VIEW  Comparison: None.  Findings: Shallow inspiration.  Normal heart size.  Diffuse bilateral perihilar and upper lobe airspace disease.  Changes could represent infection, aspiration, edema, or ARDS.  No evidence of pleural effusion or pneumothorax.  Endotracheal tube is in place with tip about 4.9 cm above the carina.  An enteric tube is in place with tip not visualized but below the level of the left hemidiaphragm consistent with location at least in the stomach.  No obvious displaced rib fractures.  IMPRESSION: Endotracheal tube tip is 4.9 cm above the carina.  Enteric tube tip is not visualized but is below the left hemidiaphragm.  Bilateral perihilar and upper lung airspace disease.  Original Report Authenticated By: Marlon Pel, M.D.     1. Cardiac arrest   2. Respiratory acidosis   3. Elevated lactic acid level   4. Elevated troponin   5. Cocaine abuse       Date: 12/25/2011  Rate: 117  Rhythm: sinus tachycardia  QRS Axis: indeterminate  Intervals: normal  ST/T Wave abnormalities: normal  Conduction Disutrbances:none  Narrative Interpretation:   Old EKG Reviewed: none available   MDM  Pt is a 46yo M brought by EMS for cardiac arrest with ROSC. Pt intubated in route. Possible drug OD per EMS. Tachy but with stable BP in ED. A-line and central line placed by critical care. UDS + cocaine and marijuana. ABG with resp acidosis so vent settings changed appropriately. CT head negative. Admitted to ICU in stable but critical condition prior to remaining labs resulted.         Consuello Masse, MD 12/25/11 [phone]

## 2011-12-25 NOTE — Progress Notes (Signed)
eLink Physician-Brief Progress Note Patient Name: Angel [name] DOB: [birth year] MRN: [id number]  Date of Service  12/25/2011   HPI/Events of Note   Levo up to 40 mcg  eICU Interventions  hypoglycemia NS bolus      Sueo Cullen V. 12/25/2011, 12:58 AM

## 2011-12-25 NOTE — Progress Notes (Signed)
HPI:  46 YO man with no past medical history. His family states that he abuses some perscription drugs and some occasional alcohol. Smokes 2 PPD history. Daughter tried to call him all day but he was sleeping which is unusual for him. His friend found him shaking violently around 8 PM and called 911 who intubated in the field for agonal breathing. He was unresponsive at the time. He was brought to the ED and placed on hypothermia protocol. No ischemic EKG changes on ED arrival. UDS positive for cocaine and THC.   Antibiotics:   Vancomycin 4/21>>> Zosyn 4/21>>>  Cultures/Sepsis Markers:   Blood 4/21>>> Urine 4/21>>>  Access/Protocols:  ETT 4/21>>> CVC 4/21>>>  Best Practice: DVT: SQ Hep GI: Protonix  Subjective: Patient sedated and intubated.  Physical Exam: Filed Vitals:   12/25/11 1000  BP: 109/85  Pulse: 62  Temp: 91.6 F (33.1 C)  Resp: 35    Intake/Output Summary (Last 24 hours) at 12/25/11 1100 Last data filed at 12/25/11 1000  Gross per 24 hour  Intake 6330.21 ml  Output   1905 ml  Net 4425.21 ml   Vent Mode:  [-] PRVC FiO2 (%):  [60 %-100 %] 70 % Set Rate:  [30 bmp-35 bmp] 35 bmp Vt Set:  [550 mL-620 mL] 550 mL PEEP:  [5 cmH20-10 cmH20] 10 cmH20 Pressure Support:  [0 cmH20] 0 cmH20 Plateau Pressure:  [22 cmH20-28 cmH20] 28 cmH20  Neuro: Sedated, intubated and paralyzed. Cardiac: RRR, Nl S1/S2, -M/R/G. Pulmonary: Diffuse crackles R>L. GI: Soft, NT, ND and +BS. Extremities: -edema and -tenderness.  Labs: CBC    Component Value Date/Time   WBC 1.6* 12/25/2011 0400   RBC 4.36 12/25/2011 0400   HGB 14.3 12/25/2011 0400   HCT 42.0 12/25/2011 0400   PLT 142* 12/25/2011 0400   MCV 96.3 12/25/2011 0400   MCH 32.8 12/25/2011 0400   MCHC 34.0 12/25/2011 0400   RDW 13.2 12/25/2011 0400   LYMPHSABS 0.8 12/24/2011 2009   MONOABS 0.1 12/24/2011 2009   EOSABS 0.0 12/24/2011 2009   BASOSABS 0.0 12/24/2011 2009   BMET    Component Value Date/Time   NA 134* 12/25/2011  0800   K 4.3 12/25/2011 0800   CL 107 12/25/2011 0800   CO2 17* 12/25/2011 0800   GLUCOSE 180* 12/25/2011 0800   BUN 37* 12/25/2011 0800   CREATININE 1.27 12/25/2011 0800   CALCIUM 6.8* 12/25/2011 0800   GFRNONAA 66* 12/25/2011 0800   GFRAA 77* 12/25/2011 0800   ABG    Component Value Date/Time   PHART 7.208* 12/25/2011 0939   PCO2ART 39.4 12/25/2011 0939   PO2ART 109.0* 12/25/2011 0939   HCO3 16.5* 12/25/2011 0939   TCO2 18 12/25/2011 0939   ACIDBASEDEF 12.0* 12/25/2011 0939   O2SAT 98.0 12/25/2011 0939   Lab 12/24/11 2147  MG 1.5   Lab Results  Component Value Date   CALCIUM 6.8* 12/25/2011   PHOS 1.7* 12/24/2011    Chest Xray:   Assessment & Plan: 46 year old male with PMH of cocaine, etoh and drug abuse presenting after being found down in a hotel room positive for cocaine and THC.  Patient was noted to have vomitus into his airway upon intubation.  RUL and RML infiltrate noted.  Patient was started on Abx and hypothermia as well ARDS protocol were started.  Neuro: sedated and intubated for now, downtime is unknown.  Will need to complete cooling first then will likely call neuro.  Heart: shocky, likely a combination  of cardiogenic and hypothermia.  Septic shock is also a concern.  Sepsis is an absolute contraindication hypothermia, however, given the age and possibility of this being pneumonitis rather than pneumonia.  Will elect to continue hypothermia protocol and treat with abx to preserve brain function. Plan: Follow CVP.  Vancomycin/zosyn.  F/U on culture.  Continue hypothermia protocol.  Cardiology called.  Continue levophed.  Pulmonary: ARDS with aspiration PNA/pneumonitis. Plan: Full vent support.  ARDS protocol adjusted.  F/U ABG pending.  Renal: Cr is ok but hyponatremia, hypomag and hypophos. Plan: Replace  Recheck  Hydrate.  BMET in AM.  GI: protein calorie malnutrition, will maintain NPO today and in AM if mental status is poor then will consult  nutrition.  Heme: low WBC, platelets and high INR indicating liver failure and likely alcohol bone marrow suppression.  Will monitor.  Endocrine: ICU hyperglycemia protocol.  TF in AM.  ID: pneumonia vs pneumonitis.  Plan: Maintain on vanc/zosyn.  F/U on cultures.  Prognosis is guarded given unknown down time.  Will finish hypothermia protocol then assess mental status in AM.  The patient is critically ill with multiple organ systems failure and requires high complexity decision making for assessment and support, frequent evaluation and titration of therapies, application of advanced monitoring technologies and extensive interpretation of multiple databases. Critical Care Time devoted to patient care services described in this note is 55 minutes.  Koren Bound, MD

## 2011-12-25 NOTE — Progress Notes (Signed)
Spoke with Elink Md about cardiology consult. In CCM H&P from ED MD says cardiology consult but no note was written from cardiology. Notified of CVP/BP after last NS bolus and orders received.

## 2011-12-25 NOTE — Progress Notes (Signed)
CBG: 64  Treatment: D50 IV 50 mL  Symptoms: None  Follow-up CBG: Time:2253 CBG Result:203  Possible Reasons for Event: none  Comments/MD notified: NA    Maryna Yeagle, Breck Coons

## 2011-12-25 NOTE — Progress Notes (Signed)
  Echocardiogram 2D Echocardiogram has been performed.  Cathie Beams Deneen 12/25/2011, 11:03 AM

## 2011-12-25 NOTE — Consult Note (Signed)
Pt is intubated at this time 

## 2011-12-25 NOTE — Progress Notes (Signed)
UR Completed. Simmons, [name] [phone]  

## 2011-12-26 ENCOUNTER — Inpatient Hospital Stay (HOSPITAL_COMMUNITY): Payer: Medicaid Other

## 2011-12-26 DIAGNOSIS — R7989 Other specified abnormal findings of blood chemistry: Secondary | ICD-10-CM

## 2011-12-26 LAB — CULTURE, RESPIRATORY W GRAM STAIN

## 2011-12-26 LAB — CBC
Hemoglobin: 16 g/dL (ref 13.0–17.0)
MCH: 33.2 pg (ref 26.0–34.0)
MCHC: 36.1 g/dL — ABNORMAL HIGH (ref 30.0–36.0)
Platelets: 120 10*3/uL — ABNORMAL LOW (ref 150–400)
RBC: 4.82 MIL/uL (ref 4.22–5.81)

## 2011-12-26 LAB — POCT I-STAT 3, ART BLOOD GAS (G3+)
Acid-base deficit: 9 mmol/L — ABNORMAL HIGH (ref 0.0–2.0)
O2 Saturation: 98 %
Patient temperature: 34.1
TCO2: 18 mmol/L (ref 0–100)

## 2011-12-26 LAB — BASIC METABOLIC PANEL
BUN: 36 mg/dL — ABNORMAL HIGH (ref 6–23)
BUN: 37 mg/dL — ABNORMAL HIGH (ref 6–23)
BUN: 38 mg/dL — ABNORMAL HIGH (ref 6–23)
CO2: 16 mEq/L — ABNORMAL LOW (ref 19–32)
Calcium: 7.1 mg/dL — ABNORMAL LOW (ref 8.4–10.5)
Calcium: 6.9 mg/dL — ABNORMAL LOW (ref 8.4–10.5)
Calcium: 6.5 mg/dL — ABNORMAL LOW (ref 8.4–10.5)
Calcium: 7 mg/dL — ABNORMAL LOW (ref 8.4–10.5)
Chloride: 107 mEq/L (ref 96–112)
Chloride: 107 mEq/L (ref 96–112)
Creatinine, Ser: 1.22 mg/dL (ref 0.50–1.35)
Creatinine, Ser: 1.24 mg/dL (ref 0.50–1.35)
Creatinine, Ser: 1.42 mg/dL — ABNORMAL HIGH (ref 0.50–1.35)
GFR calc Af Amer: 67 mL/min — ABNORMAL LOW (ref >90)
GFR calc non Af Amer: 71 mL/min — ABNORMAL LOW (ref >90)
GFR calc non Af Amer: 68 mL/min — ABNORMAL LOW (ref >90)
GFR calc non Af Amer: 58 mL/min — ABNORMAL LOW (ref >90)
Glucose, Bld: 106 mg/dL — ABNORMAL HIGH (ref 70–99)
Glucose, Bld: 136 mg/dL — ABNORMAL HIGH (ref 70–99)
Glucose, Bld: 72 mg/dL (ref 70–99)
Glucose, Bld: 92 mg/dL (ref 70–99)
Potassium: 3.8 mEq/L (ref 3.5–5.1)
Potassium: 3.8 mEq/L (ref 3.5–5.1)
Potassium: 5 mEq/L (ref 3.5–5.1)
Sodium: 139 mEq/L (ref 135–145)
Sodium: 138 mEq/L (ref 135–145)

## 2011-12-26 LAB — GLUCOSE, CAPILLARY
Glucose-Capillary: 106 mg/dL — ABNORMAL HIGH (ref 70–99)
Glucose-Capillary: 111 mg/dL — ABNORMAL HIGH (ref 70–99)
Glucose-Capillary: 64 mg/dL — ABNORMAL LOW (ref 70–99)
Glucose-Capillary: 71 mg/dL (ref 70–99)
Glucose-Capillary: 92 mg/dL (ref 70–99)

## 2011-12-26 LAB — HEPATIC FUNCTION PANEL
Albumin: 2.4 g/dL — ABNORMAL LOW (ref 3.5–5.2)
Bilirubin, Direct: 0.2 mg/dL (ref 0.0–0.3)
Total Bilirubin: 0.4 mg/dL (ref 0.3–1.2)

## 2011-12-26 LAB — URINE CULTURE
Culture  Setup Time: 201304220245
Culture: NO GROWTH

## 2011-12-26 LAB — PHOSPHORUS: Phosphorus: 3.3 mg/dL (ref 2.3–4.6)

## 2011-12-26 LAB — MAGNESIUM: Magnesium: 1.7 mg/dL (ref 1.5–2.5)

## 2011-12-26 MED ORDER — FUROSEMIDE 10 MG/ML IJ SOLN
40.0000 mg | Freq: Three times a day (TID) | INTRAMUSCULAR | Status: AC
  Administered 2011-12-26 (×2): 40 mg via INTRAVENOUS
  Filled 2011-12-26 (×2): qty 4

## 2011-12-26 MED ORDER — DEXTROSE 50 % IV SOLN
INTRAVENOUS | Status: AC
  Administered 2011-12-26: 50 mL
  Filled 2011-12-26: qty 50

## 2011-12-26 MED ORDER — DEXTROSE 50 % IV SOLN
INTRAVENOUS | Status: AC
  Administered 2011-12-26: 25 mL
  Filled 2011-12-26: qty 50

## 2011-12-26 MED ORDER — FENTANYL CITRATE 0.05 MG/ML IJ SOLN
50.0000 ug/h | INTRAMUSCULAR | Status: DC
  Administered 2011-12-26: 75 ug/h via INTRAVENOUS
  Administered 2011-12-27: 50 ug/h via INTRAVENOUS
  Administered 2011-12-28 – 2011-12-29 (×2): 200 ug/h via INTRAVENOUS
  Filled 2011-12-26 (×5): qty 50

## 2011-12-26 MED ORDER — INSULIN ASPART 100 UNIT/ML ~~LOC~~ SOLN: 0.0000 [IU] | Freq: Three times a day (TID) | SUBCUTANEOUS | Status: DC

## 2011-12-26 MED ORDER — JEVITY 1.2 CAL PO LIQD
1000.0000 mL | ORAL | Status: DC
  Administered 2011-12-26: 1000 mL
  Filled 2011-12-26 (×3): qty 1000

## 2011-12-26 MED ORDER — METOPROLOL TARTRATE 25 MG/10 ML ORAL SUSPENSION
12.5000 mg | Freq: Two times a day (BID) | ORAL | Status: DC
  Administered 2011-12-26: 12.5 mg via ORAL
  Filled 2011-12-26 (×4): qty 5

## 2011-12-26 MED ORDER — MIDAZOLAM BOLUS VIA INFUSION
1.0000 mg | INTRAVENOUS | Status: DC | PRN
  Filled 2011-12-26: qty 2

## 2011-12-26 MED ORDER — SODIUM CHLORIDE 0.9 % IV SOLN
2.0000 mg/h | INTRAVENOUS | Status: DC
  Administered 2011-12-26 – 2011-12-28 (×3): 2 mg/h via INTRAVENOUS
  Administered 2011-12-28 – 2011-12-29 (×2): 4 mg/h via INTRAVENOUS
  Filled 2011-12-26 (×6): qty 10

## 2011-12-26 MED ORDER — FENTANYL BOLUS VIA INFUSION
50.0000 ug | Freq: Four times a day (QID) | INTRAVENOUS | Status: DC | PRN
  Filled 2011-12-26: qty 100

## 2011-12-26 NOTE — Progress Notes (Signed)
CBG: 64- @1932   Treatment: D50 IV 25 mL  Symptoms: None  Follow-up CBG: Time:2006 CBG Result:106  Possible Reasons for Event:   Comments/MD notified:    Normajean Baxter

## 2011-12-26 NOTE — Progress Notes (Signed)
Rewarming initiated at 0000. Goal temperature 37C with a rate of 0.3C/hour on Arctic sun machine. 2 RN's at bedside to verify correct settings- Abby and Jeffie Pollock. Machine estimated time of goal temp reached in 13 hours.

## 2011-12-26 NOTE — Progress Notes (Signed)
HPI:  46 YO man with no past medical history. His family states that he abuses some perscription drugs and some occasional alcohol. Smokes 2 PPD history. Daughter tried to call him all day but he was sleeping which is unusual for him. His friend found him shaking violently around 8 PM and called 911 who intubated in the field for agonal breathing. He was unresponsive at the time. He was brought to the ED and placed on hypothermia protocol. No ischemic EKG changes on ED arrival. UDS positive for cocaine and THC.   Antibiotics:   Vancomycin 4/21>>> Zosyn 4/21>>>  Cultures/Sepsis Markers:   Blood 4/21>>> Urine 4/21>>>  Access/Protocols:  ETT 4/21>>> CVC 4/21>>>  Best Practice: DVT: SQ Hep GI: Protonix  Subjective: Patient sedated and intubated and still cold.  Physical Exam: 117/66 Filed Vitals:   12/26/11 1005  BP:   Pulse: 101  Temp: 96.8 F (36 C)  Resp: 35    Intake/Output Summary (Last 24 hours) at 12/26/11 1057 Last data filed at 12/26/11 1000  Gross per 24 hour  Intake 3151.88 ml  Output    776 ml  Net 2375.88 ml   Vent Mode:  [-] PRVC FiO2 (%):  [30 %-70 %] 30 % Set Rate:  [35 bmp] 35 bmp Vt Set:  [161 mL] 620 mL PEEP:  [5 cmH20-10 cmH20] 5 cmH20 Plateau Pressure:  [10 cmH20-30 cmH20] 10 cmH20  Neuro: Sedated, intubated and paralyzed. Cardiac: RRR, Nl S1/S2, -M/R/G. Pulmonary: Diffuse crackles R>L. GI: Soft, NT, ND and +BS. Extremities: -edema and -tenderness.  Labs: CBC    Component Value Date/Time   WBC 3.2* 12/26/2011 0347   RBC 4.82 12/26/2011 0347   HGB 16.0 12/26/2011 0347   HCT 44.3 12/26/2011 0347   PLT 120* 12/26/2011 0347   MCV 91.9 12/26/2011 0347   MCH 33.2 12/26/2011 0347   MCHC 36.1* 12/26/2011 0347   RDW 13.3 12/26/2011 0347   LYMPHSABS 0.8 12/24/2011 2009   MONOABS 0.1 12/24/2011 2009   EOSABS 0.0 12/24/2011 2009   BASOSABS 0.0 12/24/2011 2009   BMET    Component Value Date/Time   NA 139 12/26/2011 0758   K 5.0 12/26/2011 0758   CL 109  12/26/2011 0758   CO2 16* 12/26/2011 0758   GLUCOSE 73 12/26/2011 0758   BUN 38* 12/26/2011 0758   CREATININE 1.20 12/26/2011 0758   CALCIUM 6.9* 12/26/2011 0758   GFRNONAA 71* 12/26/2011 0758   GFRAA 82* 12/26/2011 0758   ABG    Component Value Date/Time   PHART 7.304* 12/26/2011 0336   PCO2ART 33.1* 12/26/2011 0336   PO2ART 94.0 12/26/2011 0336   HCO3 17.0* 12/26/2011 0336   TCO2 18 12/26/2011 0336   ACIDBASEDEF 9.0* 12/26/2011 0336   O2SAT 98.0 12/26/2011 0336    Lab 12/26/11 0347  MG 1.7   Lab Results  Component Value Date   CALCIUM 6.9* 12/26/2011   PHOS 3.3 12/26/2011   Chest Xray:   Assessment & Plan: 46 year old male with PMH of cocaine, etoh and drug abuse presenting after being found down in a hotel room positive for cocaine and THC.  Patient was noted to have vomitus into his airway upon intubation.  RUL and RML infiltrate noted.  Patient was started on Abx and hypothermia as well ARDS protocol were started.  Neuro: sedated and intubated for now, downtime is unknown.  Will need to complete rewarming first then will likely call neuro.  Heart: shock resolved and off pressors.  Sepsis is  an absolute contraindication hypothermia, however, given the age and possibility of this being pneumonitis rather than pneumonia.  Will elect to continue hypothermia protocol and treat with abx to preserve brain function. Plan: Follow CVP.  Vancomycin/zosyn.  F/U on culture.  Continue warming, will be warmed by 1:30 PM 4/23.  Cardiology consult anticipated.  Continue levophed for MAP of 65 mmHg.  KVO IVF and lasix (CVP 9).  Pulmonary: ARDS with aspiration PNA/pneumonitis. Plan: Full vent support.  ARDS protocol adjusted.  F/U ABG pending.  KVO IVF.  Lasix 40 mg IV q8 hours x2 doses.  Renal: Cr is ok but hyponatremia, hypomag and hypophos. Plan: Replace  Recheck  Hydrate.  BMET in AM.  GI: protein calorie malnutrition, consult nutrition for TF.  Heme: low WBC, platelets and high INR  indicating liver failure and likely alcohol bone marrow suppression.  Will monitor.  Endocrine: ICU hyperglycemia protocol.  TF in AM.  ISS started.  ID: pneumonia vs pneumonitis.  Plan: Maintain on vanc/zosyn.  F/U on cultures.  Prognosis is guarded given unknown down time.  Will finish hypothermia protocol then assess mental status in AM.  Family updated bedside.  The patient is critically ill with multiple organ systems failure and requires high complexity decision making for assessment and support, frequent evaluation and titration of therapies, application of advanced monitoring technologies and extensive interpretation of multiple databases. Critical Care Time devoted to patient care services described in this note is 55 minutes.  Koren Bound, MD

## 2011-12-26 NOTE — Progress Notes (Signed)
Advanced ETT to 7 @ 24 lip due to chest xray results. Breakdown noted on pts lips. Moved ETAB two click to right. Pt tol well. BBS crs.

## 2011-12-26 NOTE — Progress Notes (Signed)
Patient Name: Angel [name] Date of Encounter: 12/26/2011  Active Problems:  Acute respiratory failure  Pneumonia, organism unspecified  Acidosis  Altered mental status  Cardiac arrest  Anoxic encephalopathy    SUBJECTIVE: Intubated, sedated  OBJECTIVE Filed Vitals:   12/26/11 0400 12/26/11 0500 12/26/11 0600 12/26/11 0700  BP: 119/83 111/83 111/83 109/83  Pulse: 83 84 89 92  Temp: 93.2 F (34 C) 93.6 F (34.2 C) 94.6 F (34.8 C) 94.8 F (34.9 C)  TempSrc: Core (Comment) Core (Comment) Core (Comment) Core (Comment)  Resp: 24 35 35 35  Height:      Weight: 167 lb 1.7 oz (75.8 kg)     SpO2: 98% 99% 98% 98%    Intake/Output Summary (Last 24 hours) at 12/26/11 0804 Last data filed at 12/26/11 0700  Gross per 24 hour  Intake 3127.18 ml  Output    656 ml  Net 2471.18 ml   Weight change: 12 lb 12.6 oz (5.8 kg) Filed Weights   12/24/11 2220 12/25/11 0500 12/26/11 0400  Weight: 154 lb 5.2 oz (70 kg) 162 lb 0.6 oz (73.5 kg) 167 lb 1.7 oz (75.8 kg)     PHYSICAL EXAM General: Well developed, well nourished, male sedated on the vent. Head: Normocephalic, atraumatic.  Neck: Supple without bruits, JVD not elevated. Lungs:  Resp regular and unlabored, CTA anteriorly. Heart: RRR, S1, S2, no S3, S4, or murmur noted. Abdomen: Soft, non-tender, non-distended, BS present.  Extremities: No clubbing, cyanosis, no edema.  Neuro:sedated on the vent  LABS: CBC: Basename 12/26/11 0347 12/25/11 0400 12/24/11 2009  WBC 3.2* 1.6* --  NEUTROABS -- -- 1.4*  HGB 16.0 14.3 --  HCT 44.3 42.0 --  MCV 91.9 96.3 --  PLT 120* 142* --   INR: Basename 12/25/11 0355  INR 1.50*   Basic Metabolic Panel: Basename 12/26/11 0347 12/25/11 2330 12/24/11 2147  NA 137 135 --  K 4.2 3.8 --  CL 107 107 --  CO2 16* 16* --  GLUCOSE 92 136* --  BUN 37* 38* --  CREATININE 1.22 1.19 --  CALCIUM 7.1* 6.8* --  MG 1.7 -- 1.5  PHOS 3.3 -- 1.7*   Liver Function Tests: [facility] 12/26/11 0347  12/24/11 2147  AST 308* 315*  ALT 363* 341*  ALKPHOS 55 72  BILITOT 0.4 0.3  PROT 5.1* 5.0*  ALBUMIN 2.4* 2.7*   Cardiac Enzymes: Basename 12/26/2011 758 12/25/11 1342 12/25/11 0600 12/24/11 2149  CKTOTAL 2984 3735* 3071* 1742*  CKMB 108.4 60.5* 30.1* 15.0*  CKMBINDEX 3.6 1.6 1.0 0.9  TROPONINI 12.01 7.31* 1.47* 0.78*   UDS: Positive for cocaine and THC  TELE:  SR   Echo: 12/25/2011 Study Conclusions - Left ventricle: The cavity size was normal. Systolic function was severely reduced. The estimated ejection fraction was in the range of 20% to 25%. Diffuse hypokinesis. Doppler parameters are consistent with abnormal left ventricular relaxation (grade 1 diastolic dysfunction). - Right ventricle: The cavity size was mildly dilated. Systolic function was moderately reduced.   ECG: SR, rate 94, no acute ischemic changes, no Q waves  Radiology/Studies: Ct Head Wo Contrast 12/24/2011  *RADIOLOGY REPORT*  Clinical Data: Ventilated, post CPR, altered mental status  CT HEAD WITHOUT CONTRAST  Technique:  Contiguous axial images were obtained from the base of the skull through the vertex without contrast.  Comparison: Report of prior non digital study 01/03/2000 is reviewed.  Findings: No acute hemorrhage, acute infarction, or mass lesion is identified.  No midline shift.  No ventriculomegaly.  No skull fracture.  Bilateral maxillary sinus air fluid levels are noted. Partial opacification of the ethmoid and sphenoid sinuses.  Orbits are unremarkable.  IMPRESSION: No acute intracranial finding.  Sinusitis.  Original Report Authenticated By: Harrel Lemon, M.D.   Dg Chest Port 1 View 12/26/2011  *RADIOLOGY REPORT*  Clinical Data: Evaluate endotracheal tube.  PORTABLE CHEST - 1 VIEW  Comparison: 12/25/2011  Findings: Endotracheal tube is 6.8 cm above the carina.  Jugular central venous catheter the SVC region.  The airspace disease in the right lung has slightly improved, particularly in the  lower aspect.  Heart and mediastinum are stable.  Nasogastric tube extends into the abdomen.  There are vague hazy densities in the left perihilar region.  No evidence for a pneumothorax.  IMPRESSION: Decreasing airspace disease in the right lung.  Support apparatuses as described.  Original Report Authenticated By: Richarda Overlie, M.D.   Dg Chest Port 1 View 12/25/2011  *RADIOLOGY REPORT*  Clinical Data: Evaluate endotracheal tube  PORTABLE CHEST - 1 VIEW  Comparison: 12/24/2011;  Findings: Grossly unchanged cardiac silhouette and mediastinal contours.  Stable positioning of support apparatus including endotracheal tube overlying tracheal air column with tip approximately 7.6 cm from the carina.  No pneumothorax.  No change to minimal increase in consolidation of right upper and mid lung airspace opacities.  The left hemithorax is unchanged.  No new focal airspace opacities.  No definite pleural effusion.  Grossly unchanged bones.  IMPRESSION: 1.  Stable positioning of support apparatus.  No pneumothorax. 2.  No change to minimal increase in consolidation of air space opacities involving the right upper and mid lung, with differential considerations including infection, aspiration or less likely pulmonary hemorrhage.  Original Report Authenticated By: Waynard Reeds, M.D.   Dg Chest Port 1 View 12/24/2011  *RADIOLOGY REPORT*  Clinical Data: Central line placement.  PORTABLE CHEST - 1 VIEW  Comparison: 12/24/2011 at 2054 hours  Findings: Endotracheal tube tip measures about 5.6 cm above the carina.  Interval placement of right central venous catheter with tip over the mid SVC region.  No pneumothorax.  Bilateral perihilar airspace infiltrates with slight improvement since previous study. No blunting of costophrenic angles.  Normal heart size and pulmonary vascularity.  IMPRESSION: Right central venous catheter placed with tip over the mid SVC region.  No pneumothorax.  Endotracheal and enteric tubes remain in place.   Bilateral perihilar and upper lung airspace disease.  Original Report Authenticated By: Marlon Pel, M.D.   Dg Chest Portable 1 View 12/24/2011  *RADIOLOGY REPORT*  Clinical Data: The patient found unresponsive.  Status post CPR.  PORTABLE CHEST - 1 VIEW  Comparison: None.  Findings: Shallow inspiration.  Normal heart size.  Diffuse bilateral perihilar and upper lobe airspace disease.  Changes could represent infection, aspiration, edema, or ARDS.  No evidence of pleural effusion or pneumothorax.  Endotracheal tube is in place with tip about 4.9 cm above the carina.  An enteric tube is in place with tip not visualized but below the level of the left hemidiaphragm consistent with location at least in the stomach.  No obvious displaced rib fractures.  IMPRESSION: Endotracheal tube tip is 4.9 cm above the carina.  Enteric tube tip is not visualized but is below the left hemidiaphragm.  Bilateral perihilar and upper lung airspace disease.  Original Report Authenticated By: Marlon Pel, M.D.    Current Medications:    . antiseptic oral rinse  15 mL Mouth  Rinse QID  . artificial tears  1 application Both Eyes Q8H  . chlorhexidine  15 mL Mouth Rinse BID  . cisatracurium  0.1 mg/kg Intravenous Once  . EPINEPHrine      . heparin  5,000 Units Subcutaneous Q8H  . magnesium sulfate 1 - 4 g bolus IVPB  2 g Intravenous Once  . midazolam      . naloxone      . pantoprazole sodium  40 mg Per Tube Q1200  . piperacillin-tazobactam (ZOSYN)  IV  3.375 g Intravenous Q8H  . sodium phosphate  Dextrose 5% IVPB  30 mmol Intravenous Once  . vancomycin  1,000 mg Intravenous QHS  . vecuronium      . DISCONTD: magnesium  2 g Intravenous Once      . sodium chloride 50 mL/hr at 12/26/11 0200  . sodium chloride 25 mL/hr at 12/26/11 0200  . cisatracurium (NIMBEX) infusion 1 mcg/kg/min (12/26/11 0000)  . fentaNYL infusion INTRAVENOUS 75 mcg/hr (12/26/11 0650)  . midazolam (VERSED) infusion 2 mg/hr  (12/25/11 0400)  . norepinephrine (LEVOPHED) Adult infusion 20 mcg/min (12/26/11 0700)    ASSESSMENT AND PLAN: Active Problems:  Acute respiratory failure  Pneumonia, organism unspecified  Acidosis  Altered mental status  Cardiac arrest  Anoxic encephalopathy  Plan: Pt is s/p PEA arrest 4/21, [facility] protocol. UDS positive for cocaine/THC. Blood cultures positive for G- coccobacilli. He is being rewarmed now and pressors are being weaned. Management per CCM. They have initiated discussions with the family on how aggressive to be with his care per Dr Harvie Bridge note. He is full support, full code, now.  His cardiac enzymes are elevated (although the CKMB Index was normal until the last set this am) and he has significant left ventricular dysfunction of unknown duration (no previous echo in system). ECG not acute.  MD advise on adding ASA (supp for now) to meds. Consider cath once he is off sedation if his anoxic encephalopathy improves.   SignedTheodore Demark , PA-C 8:04 AM 12/26/2011  Patient seen, examined. Available data reviewed. Agree with findings, assessment, and plan as outlined by Theodore Demark, PA-C. Pt is beginning to respond neurologically, but too early to prognosticate his recovery (Versed/Fentanyl just d/c'd this afternoon). Will continue to follow and further plans pending his neurologic recovery. EKG is nonacute and doubt this was a primary coronary event. In setting of severe LV dysfunction will likely need cardiac cath prior to discharge from hospital.  Tonny Bollman, M.D. 12/26/2011 4:19 PM

## 2011-12-26 NOTE — Progress Notes (Signed)
UR Completed.  Neiman Roots Jane [phone] 12/26/2011  

## 2011-12-26 NOTE — Progress Notes (Signed)
Specimen Description: BLOOD LEFT HAND Special Requests: BOTTLES DRAWN AEROBIC Time: [id number] Culture: GRAM NEGATIVE COCCOBACILLI  Positive blood culture values reported to Dr. Vassie Loll, patient on Vanc/zosyn at this time.

## 2011-12-27 ENCOUNTER — Inpatient Hospital Stay (HOSPITAL_COMMUNITY): Payer: Medicaid Other

## 2011-12-27 LAB — POCT I-STAT 3, ART BLOOD GAS (G3+)
Bicarbonate: 22.2 mEq/L (ref 20.0–24.0)
O2 Saturation: 92 %
Patient temperature: 38.1
TCO2: 23 mmol/L (ref 0–100)
pH, Arterial: 7.398 (ref 7.350–7.450)

## 2011-12-27 LAB — BASIC METABOLIC PANEL
BUN: 41 mg/dL — ABNORMAL HIGH (ref 6–23)
Calcium: 7.2 mg/dL — ABNORMAL LOW (ref 8.4–10.5)
Creatinine, Ser: 1.64 mg/dL — ABNORMAL HIGH (ref 0.50–1.35)
GFR calc Af Amer: 56 mL/min — ABNORMAL LOW (ref >90)
GFR calc non Af Amer: 49 mL/min — ABNORMAL LOW (ref >90)

## 2011-12-27 LAB — GLUCOSE, CAPILLARY
Glucose-Capillary: 93 mg/dL (ref 70–99)
Glucose-Capillary: 95 mg/dL (ref 70–99)
Glucose-Capillary: 88 mg/dL (ref 70–99)

## 2011-12-27 LAB — CBC
HCT: 37.6 % — ABNORMAL LOW (ref 39.0–52.0)
MCHC: 36.2 g/dL — ABNORMAL HIGH (ref 30.0–36.0)
Platelets: 113 10*3/uL — ABNORMAL LOW (ref 150–400)
RDW: 13.5 % (ref 11.5–15.5)
WBC: 14.1 10*3/uL — ABNORMAL HIGH (ref 4.0–10.5)

## 2011-12-27 LAB — MAGNESIUM: Magnesium: 1.5 mg/dL (ref 1.5–2.5)

## 2011-12-27 LAB — PHOSPHORUS: Phosphorus: 3.2 mg/dL (ref 2.3–4.6)

## 2011-12-27 MED ORDER — OXEPA PO LIQD
1000.0000 mL | ORAL | Status: DC
  Administered 2011-12-27 – 2011-12-31 (×4): 1000 mL
  Filled 2011-12-27 (×10): qty 1000

## 2011-12-27 MED ORDER — MAGNESIUM SULFATE 40 MG/ML IJ SOLN
2.0000 g | Freq: Once | INTRAMUSCULAR | Status: DC
  Filled 2011-12-27: qty 50

## 2011-12-27 MED ORDER — INSULIN ASPART 100 UNIT/ML ~~LOC~~ SOLN
0.0000 [IU] | SUBCUTANEOUS | Status: DC
  Administered 2011-12-29: 1 [IU] via SUBCUTANEOUS

## 2011-12-27 MED ORDER — POTASSIUM CHLORIDE 20 MEQ/15ML (10%) PO LIQD
40.0000 meq | Freq: Once | ORAL | Status: AC
  Administered 2011-12-27: 40 meq
  Filled 2011-12-27: qty 30

## 2011-12-27 MED ORDER — MAGNESIUM SULFATE 40 MG/ML IJ SOLN
2.0000 g | Freq: Once | INTRAMUSCULAR | Status: AC
  Administered 2011-12-27: 2 g via INTRAVENOUS
  Filled 2011-12-27: qty 50

## 2011-12-27 MED ORDER — MAGNESIUM SULFATE BOLUS VIA INFUSION: 2.0000 g | Freq: Once | INTRAVENOUS | Status: DC

## 2011-12-27 MED ORDER — PRO-STAT SUGAR FREE PO LIQD
30.0000 mL | Freq: Two times a day (BID) | ORAL | Status: DC
  Administered 2011-12-27 – 2012-01-01 (×11): 30 mL
  Filled 2011-12-27 (×12): qty 30

## 2011-12-27 NOTE — Consult Note (Signed)
Reason for Consult:Altered mental status Referring Physician: Molli Knock  CC: Altered mental status  HPI: Angel [name] is an 46 y.o. male who on 4/21 was found unresponsive and shaking violently.  EMS was called and patient was intubated in the field.  Was brought to the ED where he was placed on a hypothermia protocol.  Patient was warmed on yesterday.  He became so combative that he had to be sedated again.  Consult was called for evaluation of altered mental status.   On initial work up patient was found to have a UDS positive for cocaine and THC.  Patient smokes and drinks as well.  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  Has ARDS as well.    Past Medical History  Diagnosis Date  . No significant past medical history     History reviewed. No pertinent past surgical history.  History reviewed. No pertinent family history.  Social History:  reports that he has been smoking Cigarettes.  He has a 66 pack-year smoking history. He does not have any smokeless tobacco history on file. He reports that he uses illicit drugs (Cocaine and Marijuana). His alcohol history not on file.  No Known Allergies  Medications:  I have reviewed the patient's current medications. Prior to Admission:  No prescriptions prior to admission   Scheduled:   . antiseptic oral rinse  15 mL Mouth Rinse QID  . chlorhexidine  15 mL Mouth Rinse BID  . dextrose      . feeding supplement  30 mL Per Tube BID  . furosemide  40 mg Intravenous Q8H  . heparin  5,000 Units Subcutaneous Q8H  . insulin aspart  0-9 Units Subcutaneous Q4H  . magnesium sulfate 1 - 4 g bolus IVPB  2 g Intravenous Once  . pantoprazole sodium  40 mg Per Tube Q1200  . piperacillin-tazobactam (ZOSYN)  IV  3.375 g Intravenous Q8H  . potassium chloride  40 mEq Per Tube Once  . potassium chloride  40 mEq Per Tube Once  . vancomycin  1,000 mg Intravenous QHS  . DISCONTD: feeding supplement (JEVITY 1.2 CAL)  1,000 mL Per Tube Q24H    . DISCONTD: insulin aspart  0-9 Units Subcutaneous TID WC  . DISCONTD: magnesium  2 g Intravenous Once  . DISCONTD: magnesium sulfate 1 - 4 g bolus IVPB  2 g Intravenous Once  . DISCONTD: metoprolol tartrate  12.5 mg Oral BID    ROS: Unable to obtain  Physical Examination: Blood pressure 150/68, pulse 102, temperature 100.2 F (37.9 C), temperature source Core (Comment), resp. rate 35, height 6' (1.829 m), weight 74.6 kg (164 lb 7.4 oz), SpO2 97.00%.  Neurologic Examination Mental Status: Patient does not respond to verbal stimuli.  With even light stimulation withdraws from pain and seems to attempt to localize with both upper extremities.  Does not follow commands.  No verbalizations noted.  Cranial Nerves: II: patient does not respond confrontation bilaterally, pupils right 3 mm, left 3 mm,and minimally bilaterally III,IV,VI: doll's response present bilaterally. V,VII: corneal reflex present bilaterally  VIII: patient does not respond to verbal stimuli IX,X: gag reflex reduced, XI: trapezius strength unable to test bilaterally XII: tongue strength unable to test Motor: As being examined patient clearly does not like what is being done.  Moves all extremities weakly but reasonably equally.   Sensory: Does not respond to noxious stimuli in any extremity. Deep Tendon Reflexes:  2+ with trace AJ's bilaterally Plantars: upgoing on the left and  mute on the right Cerebellar: Unable to perform    Results for orders placed during the hospital encounter of 12/24/11 (from the past 48 hour(s))  GLUCOSE, CAPILLARY     Status: Abnormal   Collection Time   12/25/11  1:41 PM      Component Value Range Comment   Glucose-Capillary 129 (*) 70 - 99 (mg/dL)    Comment 1 Documented in Chart      Comment 2 Notify RN     CARDIAC PANEL(CRET KIN+CKTOT+MB+TROPI)     Status: Abnormal   Collection Time   12/25/11  1:42 PM      Component Value Range Comment   Total CK 3735 (*) 7 - 232 (U/L)     CK, MB 60.5 (*) 0.3 - 4.0 (ng/mL) CRITICAL VALUE NOTED.  VALUE IS CONSISTENT WITH PREVIOUSLY REPORTED AND CALLED VALUE.   Troponin I 7.31 (*) <0.30 (ng/mL)    Relative Index 1.6  0.0 - 2.5    BASIC METABOLIC PANEL     Status: Abnormal   Collection Time   12/25/11  4:00 PM      Component Value Range Comment   Sodium 136  135 - 145 (mEq/L)    Potassium 3.7  3.5 - 5.1 (mEq/L)    Chloride 108  96 - 112 (mEq/L)    CO2 16 (*) 19 - 32 (mEq/L)    Glucose, Bld 146 (*) 70 - 99 (mg/dL)    BUN 38 (*) 6 - 23 (mg/dL)    Creatinine, Ser 1.91  0.50 - 1.35 (mg/dL)    Calcium 6.9 (*) 8.4 - 10.5 (mg/dL)    GFR calc non Af Amer 70 (*) >90 (mL/min)    GFR calc Af Amer 81 (*) >90 (mL/min)   GLUCOSE, CAPILLARY     Status: Abnormal   Collection Time   12/25/11  4:07 PM      Component Value Range Comment   Glucose-Capillary 148 (*) 70 - 99 (mg/dL)    Comment 1 Documented in Chart      Comment 2 Notify RN     GLUCOSE, CAPILLARY     Status: Abnormal   Collection Time   12/25/11  5:59 PM      Component Value Range Comment   Glucose-Capillary 109 (*) 70 - 99 (mg/dL)   BASIC METABOLIC PANEL     Status: Abnormal   Collection Time   12/25/11  7:49 PM      Component Value Range Comment   Sodium 137  135 - 145 (mEq/L)    Potassium 3.8  3.5 - 5.1 (mEq/L)    Chloride 108  96 - 112 (mEq/L)    CO2 16 (*) 19 - 32 (mEq/L)    Glucose, Bld 127 (*) 70 - 99 (mg/dL)    BUN 38 (*) 6 - 23 (mg/dL)    Creatinine, Ser 4.78  0.50 - 1.35 (mg/dL)    Calcium 7.0 (*) 8.4 - 10.5 (mg/dL)    GFR calc non Af Amer 72 (*) >90 (mL/min)    GFR calc Af Amer 83 (*) >90 (mL/min)   POCT I-STAT 3, BLOOD GAS (G3+)     Status: Abnormal   Collection Time   12/25/11  7:58 PM      Component Value Range Comment   pH, Arterial 7.278 (*) 7.350 - 7.450     pCO2 arterial 34.2 (*) 35.0 - 45.0 (mmHg)    pO2, Arterial 160.0 (*) 80.0 - 100.0 (mmHg)    Bicarbonate 16.8 (*)  20.0 - 24.0 (mEq/L)    TCO2 18  0 - 100 (mmol/L)    O2 Saturation 99.0       Acid-base deficit 10.0 (*) 0.0 - 2.0 (mmol/L)    Patient temperature 33.0 C      Collection site ARTERIAL LINE      Drawn by Operator      Sample type ARTERIAL     GLUCOSE, CAPILLARY     Status: Abnormal   Collection Time   12/25/11  8:01 PM      Component Value Range Comment   Glucose-Capillary 122 (*) 70 - 99 (mg/dL)   POCT I-STAT 3, BLOOD GAS (G3+)     Status: Abnormal   Collection Time   12/25/11 10:40 PM      Component Value Range Comment   pH, Arterial 7.275 (*) 7.350 - 7.450     pCO2 arterial 32.9 (*) 35.0 - 45.0 (mmHg)    pO2, Arterial 77.0 (*) 80.0 - 100.0 (mmHg)    Bicarbonate 16.0 (*) 20.0 - 24.0 (mEq/L)    TCO2 17  0 - 100 (mmol/L)    O2 Saturation 96.0      Acid-base deficit 11.0 (*) 0.0 - 2.0 (mmol/L)    Patient temperature 33.0 C      Sample type ARTERIAL     BASIC METABOLIC PANEL     Status: Abnormal   Collection Time   12/25/11 11:30 PM      Component Value Range Comment   Sodium 135  135 - 145 (mEq/L)    Potassium 3.8  3.5 - 5.1 (mEq/L)    Chloride 107  96 - 112 (mEq/L)    CO2 16 (*) 19 - 32 (mEq/L)    Glucose, Bld 136 (*) 70 - 99 (mg/dL)    BUN 38 (*) 6 - 23 (mg/dL)    Creatinine, Ser 0.45  0.50 - 1.35 (mg/dL)    Calcium 6.8 (*) 8.4 - 10.5 (mg/dL)    GFR calc non Af Amer 72 (*) >90 (mL/min)    GFR calc Af Amer 83 (*) >90 (mL/min)   GLUCOSE, CAPILLARY     Status: Abnormal   Collection Time   12/25/11 11:34 PM      Component Value Range Comment   Glucose-Capillary 104 (*) 70 - 99 (mg/dL)   POCT I-STAT 3, BLOOD GAS (G3+)     Status: Abnormal   Collection Time   12/26/11  3:36 AM      Component Value Range Comment   pH, Arterial 7.304 (*) 7.350 - 7.450     pCO2 arterial 33.1 (*) 35.0 - 45.0 (mmHg)    pO2, Arterial 94.0  80.0 - 100.0 (mmHg)    Bicarbonate 17.0 (*) 20.0 - 24.0 (mEq/L)    TCO2 18  0 - 100 (mmol/L)    O2 Saturation 98.0      Acid-base deficit 9.0 (*) 0.0 - 2.0 (mmol/L)    Patient temperature 34.1 C      Sample type ARTERIAL     GLUCOSE,  CAPILLARY     Status: Normal   Collection Time   12/26/11  3:40 AM      Component Value Range Comment   Glucose-Capillary 79  70 - 99 (mg/dL)   BASIC METABOLIC PANEL     Status: Abnormal   Collection Time   12/26/11  3:47 AM      Component Value Range Comment   Sodium 137  135 - 145 (mEq/L)    Potassium 4.2  3.5 - 5.1 (mEq/L)    Chloride 107  96 - 112 (mEq/L)    CO2 16 (*) 19 - 32 (mEq/L)    Glucose, Bld 92  70 - 99 (mg/dL)    BUN 37 (*) 6 - 23 (mg/dL)    Creatinine, Ser 1.61  0.50 - 1.35 (mg/dL)    Calcium 7.1 (*) 8.4 - 10.5 (mg/dL)    GFR calc non Af Amer 70 (*) >90 (mL/min)    GFR calc Af Amer 81 (*) >90 (mL/min)   CBC     Status: Abnormal   Collection Time   12/26/11  3:47 AM      Component Value Range Comment   WBC 3.2 (*) 4.0 - 10.5 (K/uL)    RBC 4.82  4.22 - 5.81 (MIL/uL)    Hemoglobin 16.0  13.0 - 17.0 (g/dL)    HCT 09.6  04.5 - 40.9 (%)    MCV 91.9  78.0 - 100.0 (fL)    MCH 33.2  26.0 - 34.0 (pg)    MCHC 36.1 (*) 30.0 - 36.0 (g/dL)    RDW 81.1  91.4 - 78.2 (%)    Platelets 120 (*) 150 - 400 (K/uL)   MAGNESIUM     Status: Normal   Collection Time   12/26/11  3:47 AM      Component Value Range Comment   Magnesium 1.7  1.5 - 2.5 (mg/dL)   PHOSPHORUS     Status: Normal   Collection Time   12/26/11  3:47 AM      Component Value Range Comment   Phosphorus 3.3  2.3 - 4.6 (mg/dL)   HEPATIC FUNCTION PANEL     Status: Abnormal   Collection Time   12/26/11  3:47 AM      Component Value Range Comment   Total Protein 5.1 (*) 6.0 - 8.3 (g/dL)    Albumin 2.4 (*) 3.5 - 5.2 (g/dL)    AST 956 (*) 0 - 37 (U/L)    ALT 363 (*) 0 - 53 (U/L)    Alkaline Phosphatase 55  39 - 117 (U/L)    Total Bilirubin 0.4  0.3 - 1.2 (mg/dL)    Bilirubin, Direct 0.2  0.0 - 0.3 (mg/dL)    Indirect Bilirubin 0.2 (*) 0.3 - 0.9 (mg/dL)   BASIC METABOLIC PANEL     Status: Abnormal   Collection Time   12/26/11  7:58 AM      Component Value Range Comment   Sodium 139  135 - 145 (mEq/L)    Potassium 5.0   3.5 - 5.1 (mEq/L)    Chloride 109  96 - 112 (mEq/L)    CO2 16 (*) 19 - 32 (mEq/L)    Glucose, Bld 73  70 - 99 (mg/dL)    BUN 38 (*) 6 - 23 (mg/dL)    Creatinine, Ser 2.13  0.50 - 1.35 (mg/dL)    Calcium 6.9 (*) 8.4 - 10.5 (mg/dL)    GFR calc non Af Amer 71 (*) >90 (mL/min)    GFR calc Af Amer 82 (*) >90 (mL/min)   CARDIAC PANEL(CRET KIN+CKTOT+MB+TROPI)     Status: Abnormal   Collection Time   12/26/11  7:58 AM      Component Value Range Comment   Total CK 2984 (*) 7 - 232 (U/L)    CK, MB 108.4 (*) 0.3 - 4.0 (ng/mL) CRITICAL VALUE NOTED.  VALUE IS CONSISTENT WITH PREVIOUSLY REPORTED AND CALLED VALUE.   Troponin I 12.01 (*) <0.30 (  ng/mL)    Relative Index 3.6 (*) 0.0 - 2.5    GLUCOSE, CAPILLARY     Status: Normal   Collection Time   12/26/11  8:08 AM      Component Value Range Comment   Glucose-Capillary 71  70 - 99 (mg/dL)   GLUCOSE, CAPILLARY     Status: Abnormal   Collection Time   12/26/11 11:29 AM      Component Value Range Comment   Glucose-Capillary 52 (*) 70 - 99 (mg/dL)   BASIC METABOLIC PANEL     Status: Abnormal   Collection Time   12/26/11 12:00 PM      Component Value Range Comment   Sodium 138  135 - 145 (mEq/L)    Potassium 4.0  3.5 - 5.1 (mEq/L)    Chloride 109  96 - 112 (mEq/L)    CO2 17 (*) 19 - 32 (mEq/L)    Glucose, Bld 106 (*) 70 - 99 (mg/dL)    BUN 36 (*) 6 - 23 (mg/dL)    Creatinine, Ser 4.09  0.50 - 1.35 (mg/dL)    Calcium 6.5 (*) 8.4 - 10.5 (mg/dL)    GFR calc non Af Amer 68 (*) >90 (mL/min)    GFR calc Af Amer 79 (*) >90 (mL/min)   GLUCOSE, CAPILLARY     Status: Normal   Collection Time   12/26/11 12:31 PM      Component Value Range Comment   Glucose-Capillary 92  70 - 99 (mg/dL)   BASIC METABOLIC PANEL     Status: Abnormal   Collection Time   12/26/11  4:00 PM      Component Value Range Comment   Sodium 140  135 - 145 (mEq/L)    Potassium 3.8  3.5 - 5.1 (mEq/L)    Chloride 108  96 - 112 (mEq/L)    CO2 20  19 - 32 (mEq/L)    Glucose, Bld 72  70  - 99 (mg/dL)    BUN 37 (*) 6 - 23 (mg/dL)    Creatinine, Ser 8.11 (*) 0.50 - 1.35 (mg/dL)    Calcium 7.0 (*) 8.4 - 10.5 (mg/dL)    GFR calc non Af Amer 58 (*) >90 (mL/min)    GFR calc Af Amer 67 (*) >90 (mL/min)   GLUCOSE, CAPILLARY     Status: Abnormal   Collection Time   12/26/11  5:12 PM      Component Value Range Comment   Glucose-Capillary 61 (*) 70 - 99 (mg/dL)   GLUCOSE, CAPILLARY     Status: Abnormal   Collection Time   12/26/11  7:31 PM      Component Value Range Comment   Glucose-Capillary 51 (*) 70 - 99 (mg/dL)    Comment 1 Documented in Chart      Comment 2 Notify RN     GLUCOSE, CAPILLARY     Status: Abnormal   Collection Time   12/26/11  7:32 PM      Component Value Range Comment   Glucose-Capillary 64 (*) 70 - 99 (mg/dL)    Comment 1 Documented in Chart      Comment 2 Notify RN     GLUCOSE, CAPILLARY     Status: Abnormal   Collection Time   12/26/11  8:06 PM      Component Value Range Comment   Glucose-Capillary 106 (*) 70 - 99 (mg/dL)   GLUCOSE, CAPILLARY     Status: Abnormal   Collection Time   12/26/11 11:44  PM      Component Value Range Comment   Glucose-Capillary 111 (*) 70 - 99 (mg/dL)    Comment 1 Documented in Chart      Comment 2 Notify RN     GLUCOSE, CAPILLARY     Status: Normal   Collection Time   12/27/11  3:33 AM      Component Value Range Comment   Glucose-Capillary 93  70 - 99 (mg/dL)    Comment 1 Documented in Chart      Comment 2 Notify RN     BASIC METABOLIC PANEL     Status: Abnormal   Collection Time   12/27/11  3:50 AM      Component Value Range Comment   Sodium 143  135 - 145 (mEq/L)    Potassium 3.3 (*) 3.5 - 5.1 (mEq/L)    Chloride 108  96 - 112 (mEq/L)    CO2 21  19 - 32 (mEq/L)    Glucose, Bld 106 (*) 70 - 99 (mg/dL)    BUN 41 (*) 6 - 23 (mg/dL)    Creatinine, Ser 4.54 (*) 0.50 - 1.35 (mg/dL)    Calcium 7.2 (*) 8.4 - 10.5 (mg/dL)    GFR calc non Af Amer 49 (*) >90 (mL/min)    GFR calc Af Amer 56 (*) >90 (mL/min)   CBC      Status: Abnormal   Collection Time   12/27/11  3:50 AM      Component Value Range Comment   WBC 14.1 (*) 4.0 - 10.5 (K/uL) REPEATED TO VERIFY   RBC 4.12 (*) 4.22 - 5.81 (MIL/uL)    Hemoglobin 13.6  13.0 - 17.0 (g/dL)    HCT 09.8 (*) 11.9 - 52.0 (%)    MCV 91.3  78.0 - 100.0 (fL)    MCH 33.0  26.0 - 34.0 (pg)    MCHC 36.2 (*) 30.0 - 36.0 (g/dL)    RDW 14.7  82.9 - 56.2 (%)    Platelets 113 (*) 150 - 400 (K/uL) PLATELET COUNT CONFIRMED BY SMEAR  MAGNESIUM     Status: Normal   Collection Time   12/27/11  3:50 AM      Component Value Range Comment   Magnesium 1.5  1.5 - 2.5 (mg/dL)   PHOSPHORUS     Status: Normal   Collection Time   12/27/11  3:50 AM      Component Value Range Comment   Phosphorus 3.2  2.3 - 4.6 (mg/dL)   POCT I-STAT 3, BLOOD GAS (G3+)     Status: Abnormal   Collection Time   12/27/11  3:51 AM      Component Value Range Comment   pH, Arterial 7.398  7.350 - 7.450     pCO2 arterial 36.3  35.0 - 45.0 (mmHg)    pO2, Arterial 68.0 (*) 80.0 - 100.0 (mmHg)    Bicarbonate 22.2  20.0 - 24.0 (mEq/L)    TCO2 23  0 - 100 (mmol/L)    O2 Saturation 92.0      Acid-base deficit 2.0  0.0 - 2.0 (mmol/L)    Patient temperature 38.1 C      Sample type ARTERIAL     GLUCOSE, CAPILLARY     Status: Normal   Collection Time   12/27/11  7:55 AM      Component Value Range Comment   Glucose-Capillary 98  70 - 99 (mg/dL)   GLUCOSE, CAPILLARY     Status: Normal   Collection Time  12/27/11 12:13 PM      Component Value Range Comment   Glucose-Capillary 95  70 - 99 (mg/dL)     Recent Results (from the past 240 hour(s))  CULTURE, BLOOD (ROUTINE X 2)     Status: Normal (Preliminary result)   Collection Time   12/24/11  8:11 PM      Component Value Range Status Comment   Specimen Description BLOOD RIGHT ARM   Final    Special Requests BOTTLES DRAWN AEROBIC ONLY 5CC   Final    Culture  Setup Time [id number]   Final    Culture     Final    Value:        BLOOD CULTURE RECEIVED NO GROWTH  TO DATE CULTURE WILL BE HELD FOR 5 DAYS BEFORE ISSUING A FINAL NEGATIVE REPORT   Report Status PENDING   Incomplete   URINE CULTURE     Status: Normal   Collection Time   12/24/11  8:28 PM      Component Value Range Status Comment   Specimen Description URINE, CATHETERIZED   Final    Special Requests NONE   Final    Culture  Setup Time [id number]   Final    Colony Count NO GROWTH   Final    Culture NO GROWTH   Final    Report Status 12/26/2011 FINAL   Final   CULTURE, BLOOD (ROUTINE X 2)     Status: Normal (Preliminary result)   Collection Time   12/24/11  8:48 PM      Component Value Range Status Comment   Specimen Description BLOOD LEFT HAND   Final    Special Requests BOTTLES DRAWN AEROBIC AND ANAEROBIC 10CC EA   Final    Culture  Setup Time [id number]   Final    Culture     Final    Value: GRAM NEGATIVE COCCOBACILLI     Note: Gram Stain Report Called to,Read Back By and Verified With: CATHERINE COMAN @0218  ON 12/26/11 BY MCLET   Report Status PENDING   Incomplete   MRSA PCR SCREENING     Status: Normal   Collection Time   12/24/11 10:01 PM      Component Value Range Status Comment   MRSA by PCR NEGATIVE  NEGATIVE  Final   CULTURE, RESPIRATORY     Status: Normal   Collection Time   12/24/11 11:13 PM      Component Value Range Status Comment   Specimen Description TRACHEAL ASPIRATE   Final    Special Requests NONE   Final    Gram Stain     Final    Value: FEW WBC PRESENT,BOTH PMN AND MONONUCLEAR     NO SQUAMOUS EPITHELIAL CELLS SEEN     FEW GRAM POSITIVE COCCI IN PAIRS     MODERATE GRAM NEGATIVE RODS   Culture Non-Pathogenic Oropharyngeal-type Flora Isolated.   Final    Report Status 12/26/2011 FINAL   Final     Dg Chest Port 1 View  12/27/2011  *RADIOLOGY REPORT*  Clinical Data: Endotracheal tube placement. Shortness of breath.  PORTABLE CHEST - 1 VIEW  Comparison: 12/26/2011.  Findings: Rotation to the left.  Endotracheal tube tip 5.4 cm above the carina.  Right  central line tip mid superior vena cava level. Nasogastric tube courses below the diaphragm.  The tip is not included on this exam.  Asymmetric air space disease greater on the right most notable in the right upper lobe without significant change.  Appearance suggestive of infectious infiltrate superimposed upon pulmonary vascular congestion.  Recommend follow-up until clearance.  No gross pneumothorax.  Heart size within normal limits.  IMPRESSION: Endotracheal tube tip 5.4 cm above the carina.  Asymmetric air space disease without significant change.  Please see above.  Original Report Authenticated By: Fuller Canada, M.D.   Dg Chest Port 1 View  12/26/2011  *RADIOLOGY REPORT*  Clinical Data: Evaluate endotracheal tube.  PORTABLE CHEST - 1 VIEW  Comparison: 12/25/2011  Findings: Endotracheal tube is 6.8 cm above the carina.  Jugular central venous catheter the SVC region.  The airspace disease in the right lung has slightly improved, particularly in the lower aspect.  Heart and mediastinum are stable.  Nasogastric tube extends into the abdomen.  There are vague hazy densities in the left perihilar region.  No evidence for a pneumothorax.  IMPRESSION: Decreasing airspace disease in the right lung.  Support apparatuses as described.  Original Report Authenticated By: Richarda Overlie, M.D.     Assessment/Plan:  Patient Active Hospital Problem List:  Altered mental status (12/25/2011)   Assessment: Patient has not regained baseline mental status but is only 24 hors s/p warming.  Patient has multiple issues that may be influencing his mental status and slowing his improvement to include infection, multi-organ damage and his illicit drug and alcohol use.  Patient was also found shaking violently initially and although this may indicate cerebral damage that was present early on, with a normal head CT, patient may actually have been having seizures related to another etiology.  It is also unclear at this point if  any of that seizure activity may be continuing.     Plan:  1.  EEG  2.  Will continue to follow patient with you but will not make prognostication until patient is 72 hours post warming.  Will determine prior to then if further imaging will be necessary.    *Conversation had at length with family.    Thana Farr, MD [facility] [phone] 12/27/2011, 1:37 PM

## 2011-12-27 NOTE — Procedures (Signed)
EEG NUMBER:  REFERRING PHYSICIAN:  Cherylynn Ridges, MD  HISTORY:  A 46 year old male status post cardiac arrest with altered mental status.  MEDICATIONS:  Lasix, aspirin, Zosyn, Versed, fentanyl, vancomycin, NovoLog, Mag sulfate, Lopressor, Levophed.  CONDITIONS OF RECORDING:  This is a 16-channel EEG carried out with the patient in the unresponsive state.  DESCRIPTION:  The background activity consists of a polymorphic delta activity that is seen of all quadrants and has had a frequency of approximately 2-3 Hz.  Frequently superimposed on this low polymorphic alpha activity.  This alpha activity is low voltage and well organized. The patient is given stimuli throughout the tracing.  Although, the patient responses to the stimuli.  There was no change in the background EEG noted.  Hypoventilation was not performed.  Intermittent photic stimulation failed to list any change in the tracing.  IMPRESSION:  This is an abnormal EEG secondary to general background slowing.  No epileptiform activity is noted.  This general background slowing suggests a diffuse cerebral disturbance.  Etiology may include a metabolic encephalopathy among other possibilities.          ______________________________ Thana Farr, MD    [id number] D:  12/27/2011 17:34:45  T:  12/27/2011 20:25:34  Job #:  [id number]

## 2011-12-27 NOTE — Progress Notes (Signed)
Nutrition Follow-up/consult TF initiation and management.  Pt is s/p hypothermia protocol. Started on ARDS protocol. TF was started last night, Jevity 1.2 at 20 ml/hr, instructions to hold at 20 until reassessed. MD gave permission to change/advance TF.   Diet Order:  NPO  Meds: Scheduled Meds:   . antiseptic oral rinse  15 mL Mouth Rinse QID  . chlorhexidine  15 mL Mouth Rinse BID  . dextrose      . dextrose      . feeding supplement (JEVITY 1.2 CAL)  1,000 mL Per Tube Q24H  . furosemide  40 mg Intravenous Q8H  . heparin  5,000 Units Subcutaneous Q8H  . insulin aspart  0-9 Units Subcutaneous Q4H  . metoprolol tartrate  12.5 mg Oral BID  . pantoprazole sodium  40 mg Per Tube Q1200  . piperacillin-tazobactam (ZOSYN)  IV  3.375 g Intravenous Q8H  . potassium chloride  40 mEq Per Tube Once  . vancomycin  1,000 mg Intravenous QHS  . DISCONTD: artificial tears  1 application Both Eyes Q8H  . DISCONTD: cisatracurium  0.1 mg/kg Intravenous Once  . DISCONTD: insulin aspart  0-9 Units Subcutaneous TID WC   Continuous Infusions:   . sodium chloride 20 mL/hr at 12/26/11 1800  . fentaNYL infusion INTRAVENOUS 100 mcg/hr (12/26/11 2300)  . midazolam (VERSED) infusion 2 mg/hr (12/27/11 0330)  . norepinephrine (LEVOPHED) Adult infusion 14 mcg/min (12/27/11 0630)  . DISCONTD: sodium chloride 50 mL/hr at 12/26/11 0200  . DISCONTD: cisatracurium (NIMBEX) infusion Stopped (12/26/11 1000)  . DISCONTD: fentaNYL infusion INTRAVENOUS 75 mcg/hr (12/26/11 0650)  . DISCONTD: midazolam (VERSED) infusion 2 mg/hr (12/26/11 1141)   PRN Meds:.sodium chloride, fentaNYL, midazolam, DISCONTD: fentaNYL, DISCONTD: midazolam  Labs:  CMP     Component Value Date/Time   NA 143 12/27/2011 0350   K 3.3* 12/27/2011 0350   CL 108 12/27/2011 0350   CO2 21 12/27/2011 0350   GLUCOSE 106* 12/27/2011 0350   BUN 41* 12/27/2011 0350   CREATININE 1.64* 12/27/2011 0350   CALCIUM 7.2* 12/27/2011 0350   PROT 5.1* 12/26/2011 0347     ALBUMIN 2.4* 12/26/2011 0347   AST 308* 12/26/2011 0347   ALT 363* 12/26/2011 0347   ALKPHOS 55 12/26/2011 0347   BILITOT 0.4 12/26/2011 0347   GFRNONAA 49* 12/27/2011 0350   GFRAA 56* 12/27/2011 0350     Intake/Output Summary (Last 24 hours) at 12/27/11 0834 Last data filed at 12/27/11 0700  Gross per 24 hour  Intake 1848.61 ml  Output   4015 ml  Net -2166.39 ml    Weight Status:  164 lbs, up 10 lbs from admission weight  Re-estimated needs:  2428 kcal, 110-140 gm protien  Nutrition Dx:  Inadequate oral intake, ongoing   Goal:  TF will be initiated with in 48 hours if expected to remain intubated, met New Goal: TF will meet >90% of estimated needs while intubated  Intervention:   1.RD will change formula to Oxepa, begin at 20 ml/hr and advance by 10 ml every 4 hours to a goal rate of 60 ml/hr. 2. Also provide 30 ml Pro-stat BID via tube.  At goal this will provide 2360 kcal (97%) and 120 gm protein (100%).  4. RD will continue to follow.   Monitor:  Vent, TF rate/tolerance, weight, labs, I/O's   Rudean Haskell Pager #:  [phone]

## 2011-12-27 NOTE — ED Provider Notes (Signed)
I saw and evaluated the patient, reviewed the resident's note and I agree with the findings and plan. Pt brought in s/p cardiac arrest.  Was initially in PEA.  Intubated PTA.  On arrival, pt given IVFs, BP okay.  Cultures drawn, given abx coverage.  Head CT neg.  Hypothermia which was intstituted in field was continued.  Intensivists taken over care.  CRITICAL CARE Performed by: Akeel Reffner   Total critical care time: 45  Critical care time was exclusive of separately billable procedures and treating other patients.  Critical care was necessary to treat or prevent imminent or life-threatening deterioration.  Critical care was time spent personally by me on the following activities: development of treatment plan with patient and/or surrogate as well as nursing, discussions with consultants, evaluation of patient's response to treatment, examination of patient, obtaining history from patient or surrogate, ordering and performing treatments and interventions, ordering and review of laboratory studies, ordering and review of radiographic studies, pulse oximetry and re-evaluation of patient's condition.   Rolan Bucco, MD 12/27/11 1500

## 2011-12-27 NOTE — Progress Notes (Signed)
HPI:  46 YO man with no past medical history. His family states that he abuses some perscription drugs and some occasional alcohol. Smokes 2 PPD history. Daughter tried to call him all day but he was sleeping which is unusual for him. His friend found him shaking violently around 8 PM and called 911 who intubated in the field for agonal breathing. He was unresponsive at the time. He was brought to the ED and placed on hypothermia protocol. No ischemic EKG changes on ED arrival. UDS positive for cocaine and THC.   Antibiotics:   Vancomycin 4/21>>> Zosyn 4/21>>>  Cultures/Sepsis Markers:   Blood 4/21>>>GRAM NEGATIVE COCCOBACILLI, reincubated Urine 4/21>>>Neg Sputum 4/21>>>GNR and GPC  Access/Protocols:  ETT 4/21>>> CVC 4/21>>>  Best Practice: DVT: SQ Hep GI: Protonix  Subjective: Patient sedated and intubated and still cold.  Physical Exam: 117/66 Filed Vitals:   12/27/11 0747  BP: 107/63  Pulse: 101  Temp: 100.2 F (37.9 C)  Resp: 35    Intake/Output Summary (Last 24 hours) at 12/27/11 0851 Last data filed at 12/27/11 0700  Gross per 24 hour  Intake 1848.61 ml  Output   4015 ml  Net -2166.39 ml   Vent Mode:  [-] PRVC FiO2 (%):  [30 %-50 %] 50 % Set Rate:  [35 bmp] 35 bmp Vt Set:  [620 mL] 620 mL PEEP:  [5 cmH20] 5 cmH20 Plateau Pressure:  [17 cmH20-25 cmH20] 22 cmH20  Neuro: Agitated and intubated. Cardiac: RRR, Nl S1/S2, -M/R/G. Pulmonary: Diffuse crackles R>L. GI: Soft, NT, ND and +BS. Extremities: -edema and -tenderness.  Labs: CBC    Component Value Date/Time   WBC 14.1* 12/27/2011 0350   RBC 4.12* 12/27/2011 0350   HGB 13.6 12/27/2011 0350   HCT 37.6* 12/27/2011 0350   PLT 113* 12/27/2011 0350   MCV 91.3 12/27/2011 0350   MCH 33.0 12/27/2011 0350   MCHC 36.2* 12/27/2011 0350   RDW 13.5 12/27/2011 0350   LYMPHSABS 0.8 12/24/2011 2009   MONOABS 0.1 12/24/2011 2009   EOSABS 0.0 12/24/2011 2009   BASOSABS 0.0 12/24/2011 2009   BMET    Component Value  Date/Time   NA 143 12/27/2011 0350   K 3.3* 12/27/2011 0350   CL 108 12/27/2011 0350   CO2 21 12/27/2011 0350   GLUCOSE 106* 12/27/2011 0350   BUN 41* 12/27/2011 0350   CREATININE 1.64* 12/27/2011 0350   CALCIUM 7.2* 12/27/2011 0350   GFRNONAA 49* 12/27/2011 0350   GFRAA 56* 12/27/2011 0350   ABG    Component Value Date/Time   PHART 7.398 12/27/2011 0351   PCO2ART 36.3 12/27/2011 0351   PO2ART 68.0* 12/27/2011 0351   HCO3 22.2 12/27/2011 0351   TCO2 23 12/27/2011 0351   ACIDBASEDEF 2.0 12/27/2011 0351   O2SAT 92.0 12/27/2011 0351    Lab 12/27/11 0350  MG 1.5   Lab Results  Component Value Date   CALCIUM 7.2* 12/27/2011   PHOS 3.2 12/27/2011   Chest Xray:   Assessment & Plan: 46 year old male with PMH of cocaine, etoh and drug abuse presenting after being found down in a hotel room positive for cocaine and THC.  Patient was noted to have vomitus into his airway upon intubation.  RUL and RML infiltrate noted.  Patient was started on Abx and hypothermia as well ARDS protocol were started.  Neuro: Very agitated this AM but not purposeful. Plan: Neuro consult.  Propofol if BP tolerates.  PRN sedation.  Heart: shock resolved and off pressors.  Sepsis is an absolute contraindication hypothermia, however, given the age and possibility of this being pneumonitis rather than pneumonia.  Will elect to continue hypothermia protocol and treat with abx to preserve brain function. Plan: Follow CVP.  Vancomycin/zosyn.  Cultures are all negative.  Continue warming, will be warmed by 1:30 PM 4/23.  Cardiology consult apreciated.  Continue levophed for MAP of 65 mmHg.  KVO IVF and lasix (CVP 9).  Pulmonary: ARDS with aspiration PNA/pneumonitis. Plan: Full vent support.  ARDS protocol adjusted.  F/U ABG pending.  KVO IVF.  Hold diureses today.  Renal: Cr rising with Cr, hyopK and hypoMg. Plan: Replace.  Recheck.  Hold diureses today.  BMET in AM.  GI: protein calorie malnutrition, continue  TF.  Heme: low WBC, platelets and high INR indicating liver failure and likely alcohol bone marrow suppression.  Will monitor.  Endocrine: ICU hyperglycemia protocol.  TF in AM.  ISS started.  ID: pneumonia vs pneumonitis.  Plan: Maintain on vanc/zosyn.  F/U on cultures.  Prognosis is guarded given unknown down time.   Neuro called. Pending prognosis will speak with the family.  Family updated bedside.  The patient is critically ill with multiple organ systems failure and requires high complexity decision making for assessment and support, frequent evaluation and titration of therapies, application of advanced monitoring technologies and extensive interpretation of multiple databases. Critical Care Time devoted to patient care services described in this note is 35 minutes.  Koren Bound, MD

## 2011-12-27 NOTE — Progress Notes (Signed)
Cardiology Progress Note Patient Name: Angel [name] Date of Encounter: 12/27/2011, 8:12 AM     Subjective  He was rewarmed yesterday. Attempted to turn off sedation, however, patient would not follow commands and was highly agitated so sedation was resumed.    Objective   Telemetry: Sinus Tachycardia 100-110s  Medications: . antiseptic oral rinse  15 mL Mouth Rinse QID  . chlorhexidine  15 mL Mouth Rinse BID  . dextrose      . dextrose      . feeding supplement (JEVITY 1.2 CAL)  1,000 mL Per Tube Q24H  . furosemide  40 mg Intravenous Q8H  . heparin  5,000 Units Subcutaneous Q8H  . insulin aspart  0-9 Units Subcutaneous Q4H  . metoprolol tartrate  12.5 mg Oral BID  . pantoprazole sodium  40 mg Per Tube Q1200  . piperacillin-tazobactam (ZOSYN)  IV  3.375 g Intravenous Q8H  . potassium chloride  40 mEq Per Tube Once  . vancomycin  1,000 mg Intravenous QHS   . sodium chloride 20 mL/hr at 12/26/11 1800  . fentaNYL infusion INTRAVENOUS 100 mcg/hr (12/26/11 2300)  . midazolam (VERSED) infusion 2 mg/hr (12/27/11 0330)  . norepinephrine (LEVOPHED) Adult infusion 14 mcg/min (12/27/11 0630)    Physical Exam: Temp:  [96.6 F (35.9 C)-100.8 F (38.2 C)] 100.2 F (37.9 C) (04/24 0747) Pulse Rate:  [97-117] 101  (04/24 0747) Resp:  [23-35] 35  (04/24 0747) BP: (80-110)/(41-72) 107/63 mmHg (04/24 0747) SpO2:  [91 %-97 %] 97 % (04/24 0747) FiO2 (%):  [30 %-50 %] 50 % (04/24 0747) Weight:  [164 lb 7.4 oz (74.6 kg)] 164 lb 7.4 oz (74.6 kg) (04/24 0400)  General: Intubated and sedated. Moves extremities with stimuli, but does not follow commands. Head: Normocephalic, atraumatic, sclera non-icteric, nares are without discharge.  Neck: Supple. Negative for carotid bruits or JVD Lungs: Clear anteriorly, unlabored on vent. Heart: RRR S1 S2 without murmurs, rubs, or gallops.  Abdomen: Soft, non-distended with normoactive bowel sounds. No rebound/guarding. No obvious abdominal  masses. Msk:  Strength and tone appear normal for age. Extremities: Edema to both hands, Trace BLE edema. No clubbing or cyanosis. Distal pedal pulses are intact and equal bilaterally. Neuro: Intubated and sedated. Moves extremities with stimuli, but does not follow commands. Psych:  Intubated and sedated. Moves extremities with stimuli, but does not follow commands.  Intake/Output Summary (Last 24 hours) at 12/27/11 0812 Last data filed at 12/27/11 0700  Gross per 24 hour  Intake 1848.61 ml  Output   4015 ml  Net -2166.39 ml    Labs:  Basename 12/27/11 0350 12/26/11 1600 12/26/11 0347  NA 143 140 --  K 3.3* 3.8 --  CL 108 108 --  CO2 21 20 --  GLUCOSE 106* 72 --  BUN 41* 37* --  CREATININE 1.64* 1.42* --  CALCIUM 7.2* 7.0* --  MG 1.5 -- 1.7  PHOS 3.2 -- 3.3   Basename 12/26/11 0347 12/24/11 2147  AST 308* 315*  ALT 363* 341*  ALKPHOS 55 72  BILITOT 0.4 0.3  PROT 5.1* 5.0*  ALBUMIN 2.4* 2.7*   Basename 12/24/11 2009  LIPASE 24   Basename 12/27/11 0350 12/26/11 0347 12/24/11 2009  WBC 14.1* 3.2* --  NEUTROABS -- -- 1.4*  HGB 13.6 16.0 --  HCT 37.6* 44.3 --  MCV 91.3 91.9 --  PLT 113* 120* --   Basename 12/26/11 0758 12/25/11 1342 12/25/11 0600 12/24/11 2149  CKTOTAL 2984* 3735*  3071* 1742*  CKMB 108.4* 60.5* 30.1* 15.0*  TROPONINI 12.01* 7.31* 1.47* 0.78*   Radiology/Studies:   12/25/11 - 2D Echocardiogram Study Conclusions: - Left ventricle: The cavity size was normal. Systolic function was severely reduced. The estimated ejection fraction was in the range of 20% to 25%. Diffuse hypokinesis. Doppler parameters are consistent with abnormal left ventricular relaxation (grade 1 diastolic dysfunction). - Right ventricle: The cavity size was mildly dilated. Systolic function was moderately reduced.   12/24/2011 - CT Head Findings: No acute hemorrhage, acute infarction, or mass lesion is identified.  No midline shift.  No ventriculomegaly.  No skull fracture.   Bilateral maxillary sinus air fluid levels are noted. Partial opacification of the ethmoid and sphenoid sinuses.  Orbits are unremarkable.  IMPRESSION: No acute intracranial finding.  Sinusitis  12/27/2011 - CXR Findings: Rotation to the left.  Endotracheal tube tip 5.4 cm above the carina.  Right central line tip mid superior vena cava level. Nasogastric tube courses below the diaphragm.  The tip is not included on this exam.  Asymmetric air space disease greater on the right most notable in the right upper lobe without significant change.  Appearance suggestive of infectious infiltrate superimposed upon pulmonary vascular congestion.  Recommend follow-up until clearance.  No gross pneumothorax.  Heart size within normal limits.  IMPRESSION: Endotracheal tube tip 5.4 cm above the carina.  Asymmetric air space disease without significant change.    Assessment and Plan  46 year old male with PMH of cocaine, etoh and drug abuse presented on 12/24/11 after being found down in a hotel room and was positive for cocaine and THC  1. Acute Respiratory Failure 2. Pneumonia vs Pneumonitis 3. Cardiac Arrest 4. Acidosis 5. Anoxic Encephalopathy 6. Electrolyte Abnormalities  The patient was found in PEA arrest, was resuscitated, cooled, and has now been rewarmed. He was found with vomitus in his airway and now has RUL infiltrate for which he is on abx. Sedation was weaned yesterday however patient did not follow commands and was significantly agitated, so sedation was resumed. Cardiac enzymes trended up, but no acute EKG changes, doubt primary coronary event. Echo showed severely reduced LV systolic function, EF 20-25%, diffuse hypokinesis, and mod reduced RV systolic function (no prior echo to compare). He was given 40mg  IV Lasix x 2 doses and diuresed 2.1L. Received K+/Phos supplementation. Will give Mg supplementation now. He is still requiring Levophed for low BPs. Will stop metoprolol. continue to follow and  further plans pending his neurologic recovery. In setting of severe LV dysfunction will likely need cardiac cath prior to discharge from hospital.  Signed, HOPE, JESSICA PA-C  Patient seen, examined. Available data reviewed. Agree with findings, assessment, and plan as outlined by [facility], PA-C. Agitation, nonpurposeful movements noted when sedation lightened up. Agree d/c metoprolol since he continues to require vasopressor support. Would hold lasix with CVP of 4. Plans noted for neuro consult. Prognosis guarded.  Tonny Bollman, M.D. 12/27/2011 9:09 AM

## 2011-12-27 NOTE — Progress Notes (Signed)
ANTIBIOTIC CONSULT NOTE - FOLLOW UP  Pharmacy Consult for Vanco/Zosyn Indication: pneumonia  No Known Allergies  Patient Measurements: Height: 6' (182.9 cm) Weight: 164 lb 7.4 oz (74.6 kg) IBW/kg (Calculated) : 77.6  Adjusted Body Weight:    Vital Signs: Temp: 100.2 F (37.9 C) (04/24 0747) Temp src: Core (Comment) (04/24 0700) BP: 107/63 mmHg (04/24 0747) Pulse Rate: 101  (04/24 0747) Intake/Output from previous day: 04/23 0701 - 04/24 0700 In: 1952.5 [I.V.:1160; NG/GT:420; IV Piggyback:372.5] Out: 4090 [Urine:4090] Intake/Output from this shift: Total I/O In: 68 [I.V.:48; NG/GT:20] Out: -   Labs:  Basename 12/27/11 0350 12/26/11 1600 12/26/11 1200 12/26/11 0347 12/25/11 0400  WBC 14.1* -- -- 3.2* 1.6*  HGB 13.6 -- -- 16.0 14.3  PLT 113* -- -- 120* 142*  LABCREA -- -- -- -- --  CREATININE 1.64* 1.42* 1.24 -- --   Estimated Creatinine Clearance: 59.4 ml/min (by C-G formula based on Cr of 1.64). No results found for this basename: VANCOTROUGH:2,VANCOPEAK:2,VANCORANDOM:2,GENTTROUGH:2,GENTPEAK:2,GENTRANDOM:2,TOBRATROUGH:2,TOBRAPEAK:2,TOBRARND:2,AMIKACINPEAK:2,AMIKACINTROU:2,AMIKACIN:2, in the last 72 hours   Microbiology: Recent Results (from the past 720 hour(s))  CULTURE, BLOOD (ROUTINE X 2)     Status: Normal (Preliminary result)   Collection Time   12/24/11  8:11 PM      Component Value Range Status Comment   Specimen Description BLOOD RIGHT ARM   Final    Special Requests BOTTLES DRAWN AEROBIC ONLY 5CC   Final    Culture  Setup Time [id number]   Final    Culture     Final    Value:        BLOOD CULTURE RECEIVED NO GROWTH TO DATE CULTURE WILL BE HELD FOR 5 DAYS BEFORE ISSUING A FINAL NEGATIVE REPORT   Report Status PENDING   Incomplete   URINE CULTURE     Status: Normal   Collection Time   12/24/11  8:28 PM      Component Value Range Status Comment   Specimen Description URINE, CATHETERIZED   Final    Special Requests NONE   Final    Culture  Setup Time  [id number]   Final    Colony Count NO GROWTH   Final    Culture NO GROWTH   Final    Report Status 12/26/2011 FINAL   Final   CULTURE, BLOOD (ROUTINE X 2)     Status: Normal (Preliminary result)   Collection Time   12/24/11  8:48 PM      Component Value Range Status Comment   Specimen Description BLOOD LEFT HAND   Final    Special Requests BOTTLES DRAWN AEROBIC AND ANAEROBIC 10CC EA   Final    Culture  Setup Time [id number]   Final    Culture     Final    Value: GRAM NEGATIVE COCCOBACILLI     Note: Gram Stain Report Called to,Read Back By and Verified With: CATHERINE COMAN @0218  ON 12/26/11 BY MCLET   Report Status PENDING   Incomplete   MRSA PCR SCREENING     Status: Normal   Collection Time   12/24/11 10:01 PM      Component Value Range Status Comment   MRSA by PCR NEGATIVE  NEGATIVE  Final   CULTURE, RESPIRATORY     Status: Normal   Collection Time   12/24/11 11:13 PM      Component Value Range Status Comment   Specimen Description TRACHEAL ASPIRATE   Final    Special Requests NONE   Final    Gram Stain  Final    Value: FEW WBC PRESENT,BOTH PMN AND MONONUCLEAR     NO SQUAMOUS EPITHELIAL CELLS SEEN     FEW GRAM POSITIVE COCCI IN PAIRS     MODERATE GRAM NEGATIVE RODS   Culture Non-Pathogenic Oropharyngeal-type Flora Isolated.   Final    Report Status 12/26/2011 FINAL   Final     Anti-infectives     Start     Dose/Rate Route Frequency Ordered Stop   12/25/11 0600  piperacillin-tazobactam (ZOSYN) IVPB 3.375 g       3.375 g 12.5 mL/hr over 240 Minutes Intravenous 3 times per day 12/24/11 2257     12/24/11 2359   vancomycin (VANCOCIN) IVPB 1000 mg/200 mL premix        1,000 mg 200 mL/hr over 60 Minutes Intravenous Daily at bedtime 12/24/11 2301     12/24/11 2045  piperacillin-tazobactam (ZOSYN) 3.375 g in dextrose 5 % 50 mL IVPB       3.375 g 100 mL/hr over 30 Minutes Intravenous To [facility] 12/24/11 2011 12/24/11 2206   12/24/11 2015   vancomycin  (VANCOCIN) IVPB 1000 mg/200 mL premix  Status:  Discontinued        1,000 mg 200 mL/hr over 60 Minutes Intravenous  Once 12/24/11 2011 12/24/11 2301          Assessment: 46 y.o. male admitted 12/24/2011 after being found by friend shaking violently, placed on hypothermia protocol. Pharmacy consulted to dose vancomycin/zosyn for empiric asp. PNA.   Infectious Disease: Vanc/Zosyn - #3 - Aspiration pneumonia, Tmax 100.8 and WBC 14.1, Procalcitonin = 50.4  Cultures:  Blood 4/21: GRAM NEGATIVE COCCOBACILLI x 1 Urine 4/21: Negative. Trach asp 4/21: Normal flora.  Antibiotics:  Zosyn 4/21  Vancomycin 4/21  Goal of Therapy:  Vancomycin trough level 15-20 mcg/ml  Plan:  Vanco 1g IV q24h but need to check trough with climbing Scr. Zosyn 3.375g IV q8h dose ok unless CrCl<30.  Merilynn Finland, [employer] 12/27/2011,10:20 AM

## 2011-12-27 NOTE — Progress Notes (Signed)
Called Dr Molli Knock to report a positive culture from 12/24/11 of H. Flu. Verified current antibiotics coverage, no new orders.

## 2011-12-27 NOTE — Progress Notes (Signed)
Portable EEG completed

## 2011-12-27 NOTE — Progress Notes (Signed)
eLink Physician-Brief Progress Note Patient Name: Angel [name] DOB: 06/28/1966 MRN: [id number]  Date of Service  12/27/2011   HPI/Events of Note   Hypokalemia  eICU Interventions  Potassium replaced   Intervention Category Minor Interventions: Electrolytes abnormality - evaluation and management  Damiano Stamper 12/27/2011, 4:47 AM

## 2011-12-28 ENCOUNTER — Inpatient Hospital Stay (HOSPITAL_COMMUNITY): Payer: Medicaid Other

## 2011-12-28 LAB — POCT I-STAT 3, ART BLOOD GAS (G3+)
Acid-Base Excess: 1 mmol/L (ref 0.0–2.0)
Acid-base deficit: 2 mmol/L (ref 0.0–2.0)
Bicarbonate: 22 mEq/L (ref 20.0–24.0)
O2 Saturation: 99 %
O2 Saturation: 99 %
TCO2: 25 mmol/L (ref 0–100)
TCO2: 23 mmol/L (ref 0–100)
pCO2 arterial: 31.4 mmHg — ABNORMAL LOW (ref 35.0–45.0)
pH, Arterial: 7.415 (ref 7.350–7.450)
pO2, Arterial: 113 mmHg — ABNORMAL HIGH (ref 80.0–100.0)

## 2011-12-28 LAB — BASIC METABOLIC PANEL
BUN: 40 mg/dL — ABNORMAL HIGH (ref 6–23)
Calcium: 8 mg/dL — ABNORMAL LOW (ref 8.4–10.5)
GFR calc Af Amer: >90 mL/min (ref >90)
GFR calc non Af Amer: >90 mL/min (ref >90)
Glucose, Bld: 96 mg/dL (ref 70–99)
Potassium: 3.6 mEq/L (ref 3.5–5.1)
Sodium: 147 mEq/L — ABNORMAL HIGH (ref 135–145)

## 2011-12-28 LAB — BLOOD GAS, ARTERIAL
Acid-base deficit: 1.5 mmol/L (ref 0.0–2.0)
FIO2: 0.4 %
MECHVT: 620 mL
O2 Saturation: 94.4 %
Patient temperature: 98.6
RATE: 20 resp/min
TCO2: 24.2 mmol/L (ref 0–100)

## 2011-12-28 LAB — GLUCOSE, CAPILLARY
Glucose-Capillary: 113 mg/dL — ABNORMAL HIGH (ref 70–99)
Glucose-Capillary: 113 mg/dL — ABNORMAL HIGH (ref 70–99)
Glucose-Capillary: 99 mg/dL (ref 70–99)

## 2011-12-28 LAB — CBC
Hemoglobin: 11.1 g/dL — ABNORMAL LOW (ref 13.0–17.0)
MCH: 32.4 pg (ref 26.0–34.0)
MCHC: 35.6 g/dL (ref 30.0–36.0)
RDW: 13.7 % (ref 11.5–15.5)

## 2011-12-28 LAB — PHOSPHORUS: Phosphorus: 1.3 mg/dL — ABNORMAL LOW (ref 2.3–4.6)

## 2011-12-28 MED ORDER — POTASSIUM PHOSPHATE DIBASIC 3 MMOLE/ML IV SOLN
30.0000 mmol | Freq: Once | INTRAVENOUS | Status: AC
  Administered 2011-12-28: 30 mmol via INTRAVENOUS
  Filled 2011-12-28: qty 10

## 2011-12-28 MED ORDER — FREE WATER
250.0000 mL | Freq: Four times a day (QID) | Status: DC
  Administered 2011-12-28 – 2011-12-29 (×3): 250 mL

## 2011-12-28 MED ORDER — ACETAMINOPHEN 160 MG/5ML PO SOLN
650.0000 mg | Freq: Four times a day (QID) | ORAL | Status: DC | PRN
  Administered 2011-12-28 – 2012-01-16 (×8): 650 mg via ORAL
  Filled 2011-12-28 (×8): qty 20.3

## 2011-12-28 MED ORDER — FREE WATER
250.0000 mL | Freq: Four times a day (QID) | Status: DC
  Administered 2011-12-28 (×2): 250 mL

## 2011-12-28 NOTE — Procedures (Signed)
Arterial Line Insertion Procedure Note Angel [name] [id number] November 11, 1965  Procedure: Insertion of Central Venous Catheter Indications: Frequent blood sampling and blood pressure monitoring  Procedure Details Consent: Risks of procedure as well as the alternatives and risks of each were explained to the (patient/caregiver).  Consent for procedure obtained. Time Out: Verified patient identification, verified procedure, site/side was marked, verified correct patient position, special equipment/implants available, medications/allergies/relevent history reviewed, required imaging and test results available.  Performed  Maximum sterile technique was used including antiseptics, cap, gloves, hand hygiene, mask and sheet. Skin prep: Chlorhexidine; local anesthetic administered A antimicrobial bonded/coated single lumen arterial catheter was placed in the left radial artery using the Seldinger technique.  Evaluation Blood flow good Complications: No apparent complications Patient did tolerate procedure well.   Angel [name] 12/28/2011, 6:55 AM

## 2011-12-28 NOTE — Progress Notes (Signed)
HPI:  46 YO man with no past medical history. His family states that he abuses some perscription drugs and some occasional alcohol. Smokes 2 PPD history. Daughter tried to call him all day but he was sleeping which is unusual for him. His friend found him shaking violently around 8 PM and called 911 who intubated in the field for agonal breathing. He was unresponsive at the time. He was brought to the ED and placed on hypothermia protocol. No ischemic EKG changes on ED arrival. UDS positive for cocaine and THC.   Antibiotics:   Vancomycin 4/21>>> Zosyn 4/21>>>  Cultures/Sepsis Markers:   Blood 4/21>>>GRAM NEGATIVE COCCOBACILLI, reincubated Urine 4/21>>>Neg Sputum 4/21>>>GNR and GPC  Access/Protocols:  ETT 4/21>>> CVC 4/21>>>  Best Practice: DVT: SQ Hep GI: Protonix  Subjective: Patient sedated and intubated and still cold.  Physical Exam: 117/66 Filed Vitals:   12/28/11 0800  BP:   Pulse: 78  Temp: 98.1 F (36.7 C)  Resp: 35    Intake/Output Summary (Last 24 hours) at 12/28/11 0844 Last data filed at 12/28/11 0800  Gross per 24 hour  Intake 2776.3 ml  Output   1700 ml  Net 1076.3 ml   Vent Mode:  [-] PRVC FiO2 (%):  [40 %] 40 % Set Rate:  [35 bmp] 35 bmp Vt Set:  [620 mL] 620 mL PEEP:  [5 cmH20] 5 cmH20 Plateau Pressure:  [22 cmH20-24 cmH20] 24 cmH20  Neuro: Agitated and intubated. Cardiac: RRR, Nl S1/S2, -M/R/G. Pulmonary: Diffuse crackles R>L. GI: Soft, NT, ND and +BS. Extremities: -edema and -tenderness.  Labs: CBC    Component Value Date/Time   WBC 23.0* 12/28/2011 0545   RBC 3.43* 12/28/2011 0545   HGB 11.1* 12/28/2011 0545   HCT 31.2* 12/28/2011 0545   PLT 80* 12/28/2011 0545   MCV 91.0 12/28/2011 0545   MCH 32.4 12/28/2011 0545   MCHC 35.6 12/28/2011 0545   RDW 13.7 12/28/2011 0545   LYMPHSABS 0.8 12/24/2011 2009   MONOABS 0.1 12/24/2011 2009   EOSABS 0.0 12/24/2011 2009   BASOSABS 0.0 12/24/2011 2009   BMET    Component Value Date/Time   NA 147*  12/28/2011 0545   K 3.6 12/28/2011 0545   CL 112 12/28/2011 0545   CO2 24 12/28/2011 0545   GLUCOSE 96 12/28/2011 0545   BUN 40* 12/28/2011 0545   CREATININE 0.97 12/28/2011 0545   CALCIUM 8.0* 12/28/2011 0545   GFRNONAA >90 12/28/2011 0545   GFRAA >90 12/28/2011 0545   ABG    Component Value Date/Time   PHART 7.491* 12/28/2011 0348   PCO2ART 31.4* 12/28/2011 0348   PO2ART 113.0* 12/28/2011 0348   HCO3 24.1* 12/28/2011 0348   TCO2 25 12/28/2011 0348   ACIDBASEDEF 2.0 12/27/2011 0351   O2SAT 99.0 12/28/2011 0348    Lab 12/28/11 0545  MG 2.3   Lab Results  Component Value Date   CALCIUM 8.0* 12/28/2011   PHOS 1.3* 12/28/2011   Chest Xray:   Assessment & Plan: 46 year old male with PMH of cocaine, etoh and drug abuse presenting after being found down in a hotel room positive for cocaine and THC.  Patient was noted to have vomitus into his airway upon intubation.  RUL and RML infiltrate noted.  Patient was started on Abx and hypothermia as well ARDS protocol were started.  Neuro: Very agitated this AM but not purposeful. Plan: Neuro consult appreciated.  Continue versed and fentanyl due to hypotension with propofol.  PRN sedation.  Heart: shock  resolved and off pressors.  Sepsis is an absolute contraindication hypothermia, however, given the age and possibility of this being pneumonitis rather than pneumonia.  Will elect to continue hypothermia protocol and treat with abx to preserve brain function. Plan: Follow CVP.  Vancomycin/zosyn.  Cultures are all negative.  Cardiology consult appreciated.  Continue levophed for MAP of 65 mmHg.  Hold off lasix for now.  Pulmonary: ARDS with aspiration PNA/pneumonitis. Plan: Full vent support.  Decrease RR to 26 with F/U ABG.  F/U ABG pending.  KVO IVF.  Hold diureses today.  Renal: Cr normalized but now hypernatremic, hyopK and hypoPhos. Plan: Replace.  Recheck.  Hold diureses today.  BMET in AM.  Start free water.  GI: protein calorie  malnutrition, continue TF.  Heme: low WBC, platelets and high INR indicating liver failure and likely alcohol bone marrow suppression.  Will monitor.  Endocrine: ICU hyperglycemia protocol.  TF in AM.  ISS started.  ID: pneumonia vs pneumonitis.  Plan: Maintain on vanc/zosyn.  F/U on cultures.  Prognosis is guarded given unknown down time.   Pending prognosis will speak with the family but from a pulmonary standpoint, pulmonary function is not great.  Family updated bedside.  The patient is critically ill with multiple organ systems failure and requires high complexity decision making for assessment and support, frequent evaluation and titration of therapies, application of advanced monitoring technologies and extensive interpretation of multiple databases. Critical Care Time devoted to patient care services described in this note is 35 minutes.  Koren Bound, MD [phone]

## 2011-12-28 NOTE — Progress Notes (Signed)
MD notified at 1730 of pt's temperature of 102.2. RN gave 650 mg of tylenol at 1440. Pt already receiving antibiotics. No new orders received at this time. Will continue to monitor. Lorette Ang

## 2011-12-28 NOTE — Progress Notes (Signed)
ANTIBIOTIC CONSULT NOTE - FOLLOW UP  Pharmacy Consult for Vanco/Zosyn Indication: pneumonia  No Known Allergies  12/28/2011 12:28 AM Trough 34.9 Drawn after dose given (Level ordered for 2330, but dose due at 2200. Geannie Risen, PharmD, BCPS

## 2011-12-28 NOTE — Progress Notes (Addendum)
PROGRESS NOTE  Subjective:   Patient is a 46 y.o. male with a PMHx of drug abuse, who was admitted to [facility] on 12/24/2011 for evaluation of cardiac arrest.  The patient was apparently in a motel room doing drugs. His friend found him to be unconscious. EMS was called and they found him to be in pulseless electrical activity arrest. ACLS was started and the patient was resuscitated. The patient was brought to the closest emergency room and was admitted by the pulmonary critical care team.  Hypothermic cooling was initiated in the field with the infusion of iced saline. He was placed on the Artic Sun protocol and has now been re-warmed.  He became very aggitated when the sedation was stopped so he has be continuously sedated since then.  The patient is sedated and is on the ventilator   Objective:    Vital Signs:   Temp:  [98.4 F (36.9 C)-100.8 F (38.2 C)] 98.4 F (36.9 C) (04/25 0700) Pulse Rate:  [81-110] 91  (04/25 0735) Resp:  [26-35] 35  (04/25 0735) BP: (110-150)/(52-72) 110/52 mmHg (04/25 0735) SpO2:  [95 %-98 %] 95 % (04/25 0735) FiO2 (%):  [40 %] 40 % (04/25 0736) Weight:  [160 lb 0.9 oz (72.6 kg)] 160 lb 0.9 oz (72.6 kg) (04/25 0500)  Last BM Date: 12/27/11   24-hour weight change: Weight change: -4 lb 6.6 oz (-2 kg)  Weight trends: Filed Weights   12/26/11 0400 12/27/11 0400 12/28/11 0500  Weight: 167 lb 1.7 oz (75.8 kg) 164 lb 7.4 oz (74.6 kg) 160 lb 0.9 oz (72.6 kg)    Intake/Output:  04/24 0701 - 04/25 0700 In: 2784.3 [I.V.:1196.8; NG/GT:1200; IV Piggyback:387.5] Out: 1700 [Urine:1700]     Physical Exam: BP 110/52  Pulse 91  Temp(Src) 98.4 F (36.9 C) (Core (Comment))  Resp 35  Ht 6' (1.829 m)  Wt 160 lb 0.9 oz (72.6 kg)  BMI 21.71 kg/m2  SpO2 95%  General: Vital signs reviewed and noted. Well-developed,.  Head: Normocephalic, atraumatic.  Eyes: Eyes closed  Throat: Oropharynx nonerythematous, no exudate appreciated. , intubated  Neck: Supple.  Normal carotids. No JVD  Lungs:  On the vent  Heart: Regular rate,  With normal  S1 S2. No murmurs, rubs, or gallops   Abdomen:  Soft, non-tender, non-distended with normoactive bowel sounds. No hepatomegaly. No rebound/guarding. No abdominal masses.  Extremities: No edema.  Distal pedal pulses are 2+ and equal bilaterally.  Neurologic: Sedated, unresponsive  Psych: Sedated, unresponsive this am    Labs: BMET:  Basename 12/28/11 0545 12/27/11 0350  NA 147* 143  K 3.6 3.3*  CL 112 108  CO2 24 21  GLUCOSE 96 106*  BUN 40* 41*  CREATININE 0.97 1.64*  CALCIUM 8.0* 7.2*  MG 2.3 1.5  PHOS 1.3* 3.2    Liver function tests:  Basename 12/26/11 0347  AST 308*  ALT 363*  ALKPHOS 55  BILITOT 0.4  PROT 5.1*  ALBUMIN 2.4*   No results found for this basename: LIPASE:2,AMYLASE:2 in the last 72 hours  CBC:  Basename 12/28/11 0545 12/27/11 0350  WBC 23.0* 14.1*  NEUTROABS -- --  HGB 11.1* 13.6  HCT 31.2* 37.6*  MCV 91.0 91.3  PLT 80* 113*    Cardiac Enzymes:  Basename 12/26/11 0758 12/25/11 1342  CKTOTAL 2984* 3735*  CKMB 108.4* 60.5*  TROPONINI 12.01* 7.31*    Coagulation Studies: No results found for this basename: LABPROT:5,INR:5 in the last 72 hours  Other:  Tele:  NSR  Medications:    Infusions:    . sodium chloride 20 mL/hr at 12/26/11 1800  . feeding supplement (OXEPA) 1,000 mL (12/27/11 1100)  . fentaNYL infusion INTRAVENOUS 75 mcg/hr (12/27/11 2236)  . midazolam (VERSED) infusion 2 mg/hr (12/28/11 0315)  . norepinephrine (LEVOPHED) Adult infusion 4 mcg/min (12/28/11 0500)    Scheduled Medications:    . antiseptic oral rinse  15 mL Mouth Rinse QID  . chlorhexidine  15 mL Mouth Rinse BID  . feeding supplement  30 mL Per Tube BID  . heparin  5,000 Units Subcutaneous Q8H  . insulin aspart  0-9 Units Subcutaneous Q4H  . magnesium sulfate 1 - 4 g bolus IVPB  2 g Intravenous Once  . pantoprazole sodium  40 mg Per Tube Q1200  .  piperacillin-tazobactam (ZOSYN)  IV  3.375 g Intravenous Q8H  . potassium chloride  40 mEq Per Tube Once  . vancomycin  1,000 mg Intravenous QHS  . DISCONTD: feeding supplement (JEVITY 1.2 CAL)  1,000 mL Per Tube Q24H  . DISCONTD: magnesium  2 g Intravenous Once  . DISCONTD: magnesium sulfate 1 - 4 g bolus IVPB  2 g Intravenous Once  . DISCONTD: metoprolol tartrate  12.5 mg Oral BID    Assessment/ Plan:    1. Cardiac: he has CHF with an EF of 25%.  He currently is sedated and is on the vent and has an abnormal EEG.    Requiring pressors for BP support.  Will consider further cardiac evaluation once he is off the vent and is not requiring sedation.   Length of Stay: 4  Vesta Mixer, Montez Hageman., MD, [facility] 12/28/2011, 7:47 AM

## 2011-12-29 ENCOUNTER — Inpatient Hospital Stay (HOSPITAL_COMMUNITY): Payer: Medicaid Other

## 2011-12-29 LAB — CBC
MCV: 92.6 fL (ref 78.0–100.0)
Platelets: 63 10*3/uL — ABNORMAL LOW (ref 150–400)
RBC: 3.37 MIL/uL — ABNORMAL LOW (ref 4.22–5.81)
RDW: 14.5 % (ref 11.5–15.5)
WBC: 23.7 10*3/uL — ABNORMAL HIGH (ref 4.0–10.5)

## 2011-12-29 LAB — POCT I-STAT 3, ART BLOOD GAS (G3+)
Acid-Base Excess: 2 mmol/L (ref 0.0–2.0)
Bicarbonate: 26.6 mEq/L — ABNORMAL HIGH (ref 20.0–24.0)
Patient temperature: 37.1

## 2011-12-29 LAB — GLUCOSE, CAPILLARY
Glucose-Capillary: 101 mg/dL — ABNORMAL HIGH (ref 70–99)
Glucose-Capillary: 129 mg/dL — ABNORMAL HIGH (ref 70–99)
Glucose-Capillary: 99 mg/dL (ref 70–99)

## 2011-12-29 LAB — MAGNESIUM: Magnesium: 2.1 mg/dL (ref 1.5–2.5)

## 2011-12-29 LAB — BASIC METABOLIC PANEL
CO2: 26 mEq/L (ref 19–32)
Chloride: 110 mEq/L (ref 96–112)
Creatinine, Ser: 0.94 mg/dL (ref 0.50–1.35)
GFR calc Af Amer: >90 mL/min (ref >90)
Potassium: 4 mEq/L (ref 3.5–5.1)
Sodium: 145 mEq/L (ref 135–145)

## 2011-12-29 LAB — PHOSPHORUS: Phosphorus: 2.8 mg/dL (ref 2.3–4.6)

## 2011-12-29 MED ORDER — FREE WATER
250.0000 mL | Status: DC
  Administered 2011-12-29 – 2012-01-06 (×42): 250 mL

## 2011-12-29 MED ORDER — FOLIC ACID 5 MG/ML IJ SOLN
1.0000 mg | Freq: Every day | INTRAMUSCULAR | Status: DC
  Administered 2011-12-29 – 2012-01-01 (×4): 1 mg via INTRAVENOUS
  Filled 2011-12-29 (×4): qty 0.2

## 2011-12-29 MED ORDER — SODIUM CHLORIDE 0.9 % IV SOLN
25.0000 ug/h | INTRAVENOUS | Status: DC
  Administered 2011-12-29: 200 ug/h via INTRAVENOUS
  Administered 2011-12-30 (×2): 300 ug/h via INTRAVENOUS
  Administered 2011-12-31: 30 ug/h via INTRAVENOUS
  Administered 2011-12-31 – 2012-01-03 (×7): 300 ug/h via INTRAVENOUS
  Administered 2012-01-03 (×2): 225 ug/h via INTRAVENOUS
  Administered 2012-01-04: 150 ug/h via INTRAVENOUS
  Administered 2012-01-04: 275 ug/h via INTRAVENOUS
  Administered 2012-01-05: 300 ug/h via INTRAVENOUS
  Administered 2012-01-05: 150 ug/h via INTRAVENOUS
  Filled 2011-12-29 (×19): qty 50

## 2011-12-29 MED ORDER — THIAMINE HCL 100 MG/ML IJ SOLN
100.0000 mg | Freq: Every day | INTRAMUSCULAR | Status: DC
  Administered 2011-12-29 – 2011-12-31 (×3): 100 mg via INTRAVENOUS
  Administered 2012-01-01: 11:00:00 via INTRAVENOUS
  Filled 2011-12-29 (×4): qty 1

## 2011-12-29 MED ORDER — FENTANYL CITRATE 0.05 MG/ML IJ SOLN: 25.0000 ug | INTRAMUSCULAR | Status: DC | PRN

## 2011-12-29 MED ORDER — FENTANYL CITRATE 0.05 MG/ML IJ SOLN: 50.0000 ug | INTRAMUSCULAR | Status: DC | PRN

## 2011-12-29 MED ORDER — FUROSEMIDE 10 MG/ML IJ SOLN
40.0000 mg | Freq: Three times a day (TID) | INTRAMUSCULAR | Status: AC
  Administered 2011-12-29 (×2): 40 mg via INTRAVENOUS
  Filled 2011-12-29 (×2): qty 4

## 2011-12-29 MED ORDER — SODIUM CHLORIDE 0.9 % IV SOLN
0.2000 ug/kg/h | INTRAVENOUS | Status: DC
  Administered 2011-12-29: 0.4 ug/kg/h via INTRAVENOUS
  Administered 2011-12-29: 0.8 ug/kg/h via INTRAVENOUS
  Filled 2011-12-29 (×2): qty 2

## 2011-12-29 MED ORDER — SODIUM CHLORIDE 0.9 % IV SOLN
1.0000 mg/h | INTRAVENOUS | Status: DC
  Administered 2011-12-29: 4 mg/h via INTRAVENOUS
  Administered 2011-12-30 – 2012-01-02 (×7): 6 mg/h via INTRAVENOUS
  Administered 2012-01-03: 4 mg/h via INTRAVENOUS
  Administered 2012-01-03: 6 mg/h via INTRAVENOUS
  Administered 2012-01-04: 7 mg/h via INTRAVENOUS
  Administered 2012-01-04: 8 mg/h via INTRAVENOUS
  Filled 2011-12-29 (×22): qty 10

## 2011-12-29 MED ORDER — ADULT MULTIVITAMIN LIQUID CH
5.0000 mL | Freq: Every day | ORAL | Status: DC
  Administered 2011-12-29 – 2012-01-10 (×13): 5 mL via ORAL
  Filled 2011-12-29 (×13): qty 5

## 2011-12-29 MED ORDER — MIDAZOLAM HCL 2 MG/2ML IJ SOLN: 2.0000 mg | INTRAMUSCULAR | Status: DC | PRN

## 2011-12-29 MED ORDER — POTASSIUM PHOSPHATE DIBASIC 3 MMOLE/ML IV SOLN
30.0000 mmol | Freq: Once | INTRAVENOUS | Status: AC
  Administered 2011-12-29: 30 mmol via INTRAVENOUS
  Filled 2011-12-29: qty 10

## 2011-12-29 MED ORDER — MIDAZOLAM HCL 2 MG/2ML IJ SOLN
2.0000 mg | INTRAMUSCULAR | Status: DC | PRN
  Administered 2011-12-29: 2 mg via INTRAVENOUS
  Filled 2011-12-29: qty 2

## 2011-12-29 NOTE — Progress Notes (Signed)
ANTIBIOTIC CONSULT NOTE - FOLLOW UP  Pharmacy Consult for Zosyn Indication: pneumonia  No Known Allergies  Patient Measurements: Height: 6' (182.9 cm) Weight: 162 lb 14.7 oz (73.9 kg) IBW/kg (Calculated) : 77.6  Adjusted Body Weight:    Vital Signs: Temp: 98.6 F (37 C) (04/26 1000) Temp src: Core (Comment) (04/26 1000) BP: 146/76 mmHg (04/26 0814) Pulse Rate: 87  (04/26 1000) Intake/Output from previous day: 04/25 0701 - 04/26 0700 In: 4705.4 [I.V.:1377.9; NG/GT:2310; IV Piggyback:1017.5] Out: 1590 [Urine:1590] Intake/Output from this shift: Total I/O In: 374.5 [I.V.:182; NG/GT:180; IV Piggyback:12.5] Out: 265 [Urine:265]  Labs:  [facility] 12/29/11 0400 12/28/11 0545 12/27/11 0350  WBC 23.7* 23.0* 14.1*  HGB 10.9* 11.1* 13.6  PLT 63* 80* 113*  LABCREA -- -- --  CREATININE 0.94 0.97 1.64*   Estimated Creatinine Clearance: 102.6 ml/min (by C-G formula based on Cr of 0.94).  Basename 12/27/11 2315  VANCOTROUGH 34.9*  VANCOPEAK --  Drue Dun --  GENTTROUGH --  GENTPEAK --  GENTRANDOM --  TOBRATROUGH --  TOBRAPEAK --  TOBRARND --  AMIKACINPEAK --  AMIKACINTROU --  AMIKACIN --     Microbiology: Recent Results (from the past 720 hour(s))  CULTURE, BLOOD (ROUTINE X 2)     Status: Normal (Preliminary result)   Collection Time   12/24/11  8:11 PM      Component Value Range Status Comment   Specimen Description BLOOD RIGHT ARM   Final    Special Requests BOTTLES DRAWN AEROBIC ONLY 5CC   Final    Culture  Setup Time [id number]   Final    Culture     Final    Value:        BLOOD CULTURE RECEIVED NO GROWTH TO DATE CULTURE WILL BE HELD FOR 5 DAYS BEFORE ISSUING A FINAL NEGATIVE REPORT   Report Status PENDING   Incomplete   URINE CULTURE     Status: Normal   Collection Time   12/24/11  8:28 PM      Component Value Range Status Comment   Specimen Description URINE, CATHETERIZED   Final    Special Requests NONE   Final    Culture  Setup Time [id number]    Final    Colony Count NO GROWTH   Final    Culture NO GROWTH   Final    Report Status 12/26/2011 FINAL   Final   CULTURE, BLOOD (ROUTINE X 2)     Status: Normal (Preliminary result)   Collection Time   12/24/11  8:48 PM      Component Value Range Status Comment   Specimen Description BLOOD LEFT HAND   Final    Special Requests BOTTLES DRAWN AEROBIC AND ANAEROBIC 10CC EA   Final    Culture  Setup Time [id number]   Final    Culture     Final    Value: HAEMOPHILUS INFLUENZAE     Note: BETA LACTAMASE NEGATIVE CRITICAL RESULT CALLED TO, READ BACK BY AND VERIFIED WITH: KAMALA GOODE 1450 12/27/11 BY KRAWS     Note: Gram Stain Report Called to,Read Back By and Verified With: CATHERINE COMAN @0218  ON 12/26/11 BY MCLET   Report Status PENDING   Incomplete   MRSA PCR SCREENING     Status: Normal   Collection Time   12/24/11 10:01 PM      Component Value Range Status Comment   MRSA by PCR NEGATIVE  NEGATIVE  Final   CULTURE, RESPIRATORY     Status: Normal  Collection Time   12/24/11 11:13 PM      Component Value Range Status Comment   Specimen Description TRACHEAL ASPIRATE   Final    Special Requests NONE   Final    Gram Stain     Final    Value: FEW WBC PRESENT,BOTH PMN AND MONONUCLEAR     NO SQUAMOUS EPITHELIAL CELLS SEEN     FEW GRAM POSITIVE COCCI IN PAIRS     MODERATE GRAM NEGATIVE RODS   Culture Non-Pathogenic Oropharyngeal-type Flora Isolated.   Final    Report Status 12/26/2011 FINAL   Final     Anti-infectives     Start     Dose/Rate Route Frequency Ordered Stop   12/25/11 0600   piperacillin-tazobactam (ZOSYN) IVPB 3.375 g        3.375 g 12.5 mL/hr over 240 Minutes Intravenous 3 times per day 12/24/11 2257     12/24/11 2359   vancomycin (VANCOCIN) IVPB 1000 mg/200 mL premix  Status:  Discontinued        1,000 mg 200 mL/hr over 60 Minutes Intravenous Daily at bedtime 12/24/11 2301 12/29/11 1009   12/24/11 2045   piperacillin-tazobactam (ZOSYN) 3.375 g in dextrose 5 % 50 mL  IVPB        3.375 g 100 mL/hr over 30 Minutes Intravenous To [facility] 12/24/11 2011 12/24/11 2206   12/24/11 2015   vancomycin (VANCOCIN) IVPB 1000 mg/200 mL premix  Status:  Discontinued        1,000 mg 200 mL/hr over 60 Minutes Intravenous  Once 12/24/11 2011 12/24/11 2301          Assessment: 46 y.o. male admitted 12/24/2011 after being found by friend shaking violently, placed on hypothermia protocol.  Infectious Disease: Zosyn #5 (Vanco d/c today) - Aspiration pneumonia, Tmax 102.2 and WBC up to 23.7.  Procalcitonin = 50.4. Renal function stable.  Cultures:  Blood 4/21: Haemophilus Influenzae x 1. Beta-lactamase neg Urine 4/21: Negative. Trach asp 4/21: Normal flora.  Anticoagulation: DVT Px: SQ Hep Cbc stable  Cardiovascular: Cardiogenic vs septic shock on norepi  Endocrinology: no DM hx - CBG 87-113 on SSI  Gastrointestinal / Nutrition: Oxepa, Tube PPI, MV, FA and thiamine IV, Kphos x 1 today for phos 2.8  Neurology: Drug abuse cocaine/THC/Rx drugs, alcohol, tobacco 2PPD, sedated on vent: fent, versed, (fully rewarmed). Head CT = no acute findings. Very agitated this am.  Nephrology: Scr 0.94  Pulmonary: VDRF  Hematology / Oncology: Alcohol BM supression.  Thrombocytopenia present - no active bleeding. INR 1.5 and LFT's elevated (shock?)  PTA Medication Issues: Home Meds UNKNOWN  Best Practices: DVT Prophylaxis: SQ Hep, PTX via tube   Plan:  Zosyn 3.375g IV q8h dose ok unless CrCl<30.  Newel Oien S. Merilynn Finland, PharmD, BCPS Clinical Staff Pharmacist Pager [phone]  12/29/2011,10:37 AM

## 2011-12-29 NOTE — Progress Notes (Signed)
HPI:  46 YO man with no past medical history. His family states that he abuses some perscription drugs and some occasional alcohol. Smokes 2 PPD history. Daughter tried to call him all day but he was sleeping which is unusual for him. His friend found him shaking violently around 8 PM and called 911 who intubated in the field for agonal breathing. He was unresponsive at the time. He was brought to the ED and placed on hypothermia protocol. No ischemic EKG changes on ED arrival. UDS positive for cocaine and THC.   Antibiotics:   Vancomycin 4/21>>> Zosyn 4/21>>>4/26  Cultures/Sepsis Markers:   Blood 4/21>>>HAEMOPHILUS INFLUENZAE Urine 4/21>>>Neg Sputum 4/21>>>GNR and GPC  Access/Protocols:  ETT 4/21>>> CVC 4/21>>>  Best Practice: DVT: SQ Hep GI: Protonix  Subjective: Agitated but moving all ext spontaneously.  Physical Exam: 146/76 Filed Vitals:   12/29/11 1000  BP:   Pulse: 87  Temp: 98.6 F (37 C)  Resp: 20    Intake/Output Summary (Last 24 hours) at 12/29/11 1005 Last data filed at 12/29/11 1000  Gross per 24 hour  Intake   4369 ml  Output   1630 ml  Net   2739 ml   Vent Mode:  [-] PRVC FiO2 (%):  [40 %-40.1 %] 40 % Set Rate:  [20 bmp] 20 bmp Vt Set:  [620 mL] 620 mL PEEP:  [4.6 cmH20-5 cmH20] 4.6 cmH20 Plateau Pressure:  [19 cmH20-22 cmH20] 22 cmH20  Neuro: Agitated and intubated. Cardiac: RRR, Nl S1/S2, -M/R/G. Pulmonary: Diffuse crackles R>L. GI: Soft, NT, ND and +BS. Extremities: -edema and -tenderness.  Labs: CBC    Component Value Date/Time   WBC 23.7* 12/29/2011 0400   RBC 3.37* 12/29/2011 0400   HGB 10.9* 12/29/2011 0400   HCT 31.2* 12/29/2011 0400   PLT 63* 12/29/2011 0400   MCV 92.6 12/29/2011 0400   MCH 32.3 12/29/2011 0400   MCHC 34.9 12/29/2011 0400   RDW 14.5 12/29/2011 0400   LYMPHSABS 0.8 12/24/2011 2009   MONOABS 0.1 12/24/2011 2009   EOSABS 0.0 12/24/2011 2009   BASOSABS 0.0 12/24/2011 2009   BMET    Component Value Date/Time   NA 145  12/29/2011 0400   K 4.0 12/29/2011 0400   CL 110 12/29/2011 0400   CO2 26 12/29/2011 0400   GLUCOSE 101* 12/29/2011 0400   BUN 36* 12/29/2011 0400   CREATININE 0.94 12/29/2011 0400   CALCIUM 8.0* 12/29/2011 0400   GFRNONAA >90 12/29/2011 0400   GFRAA >90 12/29/2011 0400   ABG    Component Value Date/Time   PHART 7.412 12/29/2011 0349   PCO2ART 41.9 12/29/2011 0349   PO2ART 104.0* 12/29/2011 0349   HCO3 26.6* 12/29/2011 0349   TCO2 28 12/29/2011 0349   ACIDBASEDEF 1.5 12/28/2011 1130   O2SAT 98.0 12/29/2011 0349   Lab 12/29/11 0400  MG 2.1   Lab Results  Component Value Date   CALCIUM 8.0* 12/29/2011   PHOS 2.8 12/29/2011   Chest Xray:   Assessment & Plan: 46 year old male with PMH of cocaine, etoh and drug abuse presenting after being found down in a hotel room positive for cocaine and THC.  Patient was noted to have vomitus into his airway upon intubation.  RUL and RML infiltrate noted.  Patient was started on Abx and hypothermia as well ARDS protocol were started.  Neuro: Very agitated this AM but not purposeful, concerned for signs of withdrawal. Plan: Neuro consult appreciated, anticipate mild to moderate anoxic injury given EEG findings.  Change to precedex and intermittent sedation.  PRN sedation.  Heart: shock resolved and off pressors.  Sepsis is an absolute contraindication hypothermia, however, given the age and possibility of this being pneumonitis rather than pneumonia.  Will elect to continue hypothermia protocol and treat with abx to preserve brain function. Plan: Follow CVP.  Zosyn only for H. Flu in blood.  Cardiology consult appreciated.  Continue levophed for MAP of 65 mmHg.  Low dose diureses today.  Pulmonary: ARDS with aspiration PNA/pneumonitis. Plan: Begin PS trials today as patient becomes more awake.  KVO IVF.  Low dose diureses.  Renal: Cr normalized but now hypernatremic, hyopK and hypoPhos. Plan: Replace K and Phos again, Mg ok.  Recheck.  Diureses as  ordered.  BMET in AM.  Increase free water to q4.  GI: protein calorie malnutrition, continue TF.  Heme: Low plat and high INR, likely due to liver disorder and sepsis but WBC now elevated due to infection.  Endocrine: ICU hyperglycemia protocol.  TF in AM.  ISS started.  ID: pneumonia vs pneumonitis.  Plan: Maintain on zosyn.  D/C vancomycin.  Prognosis is guarded given unknown down time.  However, evidence of anoxic injury on EEG with neurology following.  Family updated bedside.  The patient is critically ill with multiple organ systems failure and requires high complexity decision making for assessment and support, frequent evaluation and titration of therapies, application of advanced monitoring technologies and extensive interpretation of multiple databases. Critical Care Time devoted to patient care services described in this note is 35 minutes.  Koren Bound, MD [phone]

## 2011-12-29 NOTE — Progress Notes (Signed)
RT placed pt on SBT, pt failed. Pt's RR was 55. Pt was extremely agitated. Sedation was off at this time. RT will continue to monitor.

## 2011-12-29 NOTE — Consult Note (Addendum)
Subjective: Patient is stable.   Objective: Vital signs in last 24 hours: Temp:  [98.2 F (36.8 C)-102.2 F (39 C)] 98.8 F (37.1 C) (04/26 0700) Pulse Rate:  [82-114] 86  (04/26 0700) Resp:  [20-26] 20  (04/26 0700) BP: (95-117)/(47-66) 117/65 mmHg (04/26 0343) SpO2:  [94 %-99 %] 99 % (04/26 0700) FiO2 (%):  [40 %-40.1 %] 40 % (04/26 0700) Weight:  [73.9 kg (162 lb 14.7 oz)] 73.9 kg (162 lb 14.7 oz) (04/26 0500)  Intake/Output from previous day: 04/25 0701 - 04/26 0700 In: 4705.4 [I.V.:1377.9; NG/GT:2310; IV Piggyback:1017.5] Out: 1590 [Urine:1590]   Nutritional status: NPO  Neurological exam: Intubated and sedated. Does not open eyes spontaneously. Does not attend to examiner. PERRL. + corneals, + dolls, + gag and cough reflexes, withdraws to pain all 4 extremities and is briefly arousable by sternal rub  Lab Results:  Basename 12/29/11 0400 12/28/11 0545  WBC 23.7* 23.0*  HGB 10.9* 11.1*  HCT 31.2* 31.2*  PLT 63* 80*  NA 145 147*  K 4.0 3.6  CL 110 112  CO2 26 24  GLUCOSE 101* 96  BUN 36* 40*  CREATININE 0.94 0.97  CALCIUM 8.0* 8.0*  LABA1C -- --   Studies/Results: Dg Chest Port 1 View  12/29/2011  *RADIOLOGY REPORT*  Clinical Data: Endotracheal tube positioning  PORTABLE CHEST - 1 VIEW  Comparison: 12/28/2011  Findings: The endotracheal tube is stable in position, 4 cm above the level of the carina.  Right internal jugular CVP and nasogastric tube remain in place.  Lower lung volumes are present taking this into consideration the heart size is within normal limits. Increase in pulmonary vascular congestion is noted.  Persistent asymmetry of density in the right hemithorax is noted with new increased density at the left lung base and perihilar zones.  A veiling density overlying the right lower lung zones suggests the possibility of a posteriorly layering effusion.  This appearance may be the result of asymmetric pulmonary edema of new effusion with pneumonia or ARDS  also possible.  IMPRESSION: Lower lung volumes with interval increase in bibasilar density and probable new posteriorly layering right effusion.  Stable support tubes  Original Report Authenticated By: Bertha Stakes, M.D.   Dg Chest Port 1 View  12/28/2011  *RADIOLOGY REPORT*  Clinical Data: Evaluate endotracheal tube placement  PORTABLE CHEST - 1 VIEW  Comparison: 12/27/2011; 12/26/2011; 12/25/2011  Findings:  Grossly unchanged cardiac silhouette and mediastinal contours. Stable positioning of support apparatus.  No pneumothorax. Continued minimal improved aeration of the right upper lung with persistent right upper and mid lung heterogeneous airspace opacities.  No new focal airspace opacities.  No definite pleural effusion.  Grossly unchanged bones.  IMPRESSION: 1.  Stable positioning of support apparatus.  No pneumothorax. 2.  Continued improved aeration of right upper lung with persistent right upper and mid lung heterogeneous air space opacities worrisome for infection.  Original Report Authenticated By: Waynard Reeds, M.D.   Medications: I have reviewed the patient's current medications.  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 Will follow clinically and make daily determination of his mental status.    LOS: 5 days   Angel [name]

## 2011-12-29 NOTE — Progress Notes (Signed)
PROGRESS NOTE  Subjective:   Patient is a 46 y.o. male with a PMHx of drug abuse, who was admitted to [facility] on 12/24/2011 for evaluation of cardiac arrest.  The patient was apparently in a motel room doing drugs. His friend found him to be unconscious. EMS was called and they found him to be in pulseless electrical activity arrest. ACLS was started and the patient was resuscitated. The patient was brought to the closest emergency room and was admitted by the pulmonary critical care team.  Hypothermic cooling was initiated in the field with the infusion of iced saline. He was placed on the Artic Sun protocol and has now been re-warmed.  He became very aggitated when the sedation was stopped so he has be continuously sedated since then.  The patient is sedated and is on the ventilator.  Several attempts to allow him to wake up have failed due to aggitation.  His lung function is very poor ( aspiration pneumonia, long smoking hx)   Objective:    Vital Signs:   Temp:  [98.1 F (36.7 C)-102.2 F (39 C)] 98.8 F (37.1 C) (04/26 0700) Pulse Rate:  [78-114] 86  (04/26 0700) Resp:  [20-35] 20  (04/26 0700) BP: (95-117)/(47-66) 117/65 mmHg (04/26 0343) SpO2:  [94 %-99 %] 99 % (04/26 0700) FiO2 (%):  [40 %-40.1 %] 40 % (04/26 0700) Weight:  [162 lb 14.7 oz (73.9 kg)] 162 lb 14.7 oz (73.9 kg) (04/26 0500)  Last BM Date: 12/28/11   24-hour weight change: Weight change: 2 lb 13.9 oz (1.3 kg)  Weight trends: Filed Weights   12/27/11 0400 12/28/11 0500 12/29/11 0500  Weight: 164 lb 7.4 oz (74.6 kg) 160 lb 0.9 oz (72.6 kg) 162 lb 14.7 oz (73.9 kg)    Intake/Output:  04/25 0701 - 04/26 0700 In: 4705.4 [I.V.:1377.9; NG/GT:2310; IV Piggyback:1017.5] Out: 1590 [Urine:1590]     Physical Exam: BP 117/65  Pulse 86  Temp(Src) 98.8 F (37.1 C) (Core (Comment))  Resp 20  Ht 6' (1.829 m)  Wt 162 lb 14.7 oz (73.9 kg)  BMI 22.10 kg/m2  SpO2 99%  General: Vital signs reviewed and noted.  sedated.  Head: Normocephalic, atraumatic.  Eyes: Eyes closed  Throat: Oropharynx nonerythematous, no exudate appreciated.   Neck: Supple. Normal carotids. No JVD  Lungs:  On the vent  Heart: Regular rate,  With normal  S1 S2. No murmurs, rubs, or gallops   Abdomen:  Soft, non-tender, non-distended with normoactive bowel sounds. No hepatomegaly. No rebound/guarding. No abdominal masses.  Extremities: No edema.  Distal pedal pulses are 2+ and equal bilaterally.  Neurologic: Sedated, agitated when off sedation  Psych: sedated    Labs: BMET:  Basename 12/29/11 0400 12/28/11 0545  NA 145 147*  K 4.0 3.6  CL 110 112  CO2 26 24  GLUCOSE 101* 96  BUN 36* 40*  CREATININE 0.94 0.97  CALCIUM 8.0* 8.0*  MG 2.1 2.3  PHOS 2.8 1.3*    Liver function tests: No results found for this basename: AST:2,ALT:2,ALKPHOS:2,BILITOT:2,PROT:2,ALBUMIN:2 in the last 72 hours No results found for this basename: LIPASE:2,AMYLASE:2 in the last 72 hours  CBC:  Basename 12/29/11 0400 12/28/11 0545  WBC 23.7* 23.0*  NEUTROABS -- --  HGB 10.9* 11.1*  HCT 31.2* 31.2*  MCV 92.6 91.0  PLT 63* 80*    Cardiac Enzymes:  Basename 12/26/11 0758  CKTOTAL 2984*  CKMB 108.4*  TROPONINI 12.01*    Coagulation Studies: No results found for this basename:  LABPROT:5,INR:5 in the last 72 hours  Other:    Tele:  NSR  Medications:    Infusions:    . sodium chloride 20 mL/hr at 12/26/11 1800  . feeding supplement (OXEPA) 1,000 mL (12/27/11 1100)  . fentaNYL infusion INTRAVENOUS 200 mcg/hr (12/29/11 0400)  . midazolam (VERSED) infusion 4 mg/hr (12/29/11 0400)  . norepinephrine (LEVOPHED) Adult infusion Stopped (12/28/11 1000)    Scheduled Medications:    . antiseptic oral rinse  15 mL Mouth Rinse QID  . chlorhexidine  15 mL Mouth Rinse BID  . feeding supplement  30 mL Per Tube BID  . free water  250 mL Per Tube Q6H  . heparin  5,000 Units Subcutaneous Q8H  . insulin aspart  0-9 Units  Subcutaneous Q4H  . pantoprazole sodium  40 mg Per Tube Q1200  . piperacillin-tazobactam (ZOSYN)  IV  3.375 g Intravenous Q8H  . potassium phosphate IVPB (mmol)  30 mmol Intravenous Once  . vancomycin  1,000 mg Intravenous QHS  . DISCONTD: free water  250 mL Per Tube Q6H    Assessment/ Plan:    1. Cardiac: he has CHF with an EF of 25%.  He currently is sedated and is on the vent and has an abnormal EEG.    He is off pressors Will consider further cardiac evaluation once he is off the vent and is not requiring sedation.  No further recs at this point.   He is critically ill from a pulmonary/ sepsis standpoint.  His prognosis is poor.  WBC is going up.  Platelet count is decreasing.   Length of Stay: 5  Vesta Mixer, Montez Hageman., MD, [facility] 12/29/2011, 7:15 AM

## 2011-12-29 NOTE — Progress Notes (Signed)
Name: Angel [name] MRN: [id number] DOB: 1965/11/13  ELECTRONIC ICU PHYSICIAN NOTE  Problem:  Agitation on precedex max drip with prn bolus versed and fentanyl  Intervention:  Change back to versed/fentanyl infusions  Sandrea Hughs 12/29/2011, 5:13 PM

## 2011-12-30 ENCOUNTER — Inpatient Hospital Stay (HOSPITAL_COMMUNITY): Payer: Medicaid Other

## 2011-12-30 DIAGNOSIS — R069 Unspecified abnormalities of breathing: Secondary | ICD-10-CM

## 2011-12-30 LAB — APTT: aPTT: 44 seconds — ABNORMAL HIGH (ref 24–37)

## 2011-12-30 LAB — BASIC METABOLIC PANEL
GFR calc Af Amer: >90 mL/min (ref >90)
GFR calc non Af Amer: >90 mL/min (ref >90)
Glucose, Bld: 123 mg/dL — ABNORMAL HIGH (ref 70–99)
Potassium: 3.9 mEq/L (ref 3.5–5.1)
Sodium: 142 mEq/L (ref 135–145)

## 2011-12-30 LAB — CBC
Hemoglobin: 10.9 g/dL — ABNORMAL LOW (ref 13.0–17.0)
MCHC: 35 g/dL (ref 30.0–36.0)
Platelets: 65 10*3/uL — ABNORMAL LOW (ref 150–400)

## 2011-12-30 LAB — MAGNESIUM: Magnesium: 1.7 mg/dL (ref 1.5–2.5)

## 2011-12-30 LAB — PHOSPHORUS: Phosphorus: 4.4 mg/dL (ref 2.3–4.6)

## 2011-12-30 LAB — POCT I-STAT 3, ART BLOOD GAS (G3+)
Acid-Base Excess: 7 mmol/L — ABNORMAL HIGH (ref 0.0–2.0)
Bicarbonate: 32.9 mEq/L — ABNORMAL HIGH (ref 20.0–24.0)
Patient temperature: 37.8
TCO2: 34 mmol/L (ref 0–100)

## 2011-12-30 LAB — PROCALCITONIN: Procalcitonin: 6.51 ng/mL

## 2011-12-30 LAB — LACTIC ACID, PLASMA: Lactic Acid, Venous: 0.9 mmol/L (ref 0.5–2.2)

## 2011-12-30 MED ORDER — MAGNESIUM SULFATE 40 MG/ML IJ SOLN
2.0000 g | Freq: Once | INTRAMUSCULAR | Status: AC
  Administered 2011-12-30: 2 g via INTRAVENOUS
  Filled 2011-12-30: qty 50

## 2011-12-30 MED ORDER — ENALAPRILAT 1.25 MG/ML IV SOLN
0.6250 mg | Freq: Four times a day (QID) | INTRAVENOUS | Status: DC
  Administered 2011-12-30 – 2012-01-01 (×8): 0.625 mg via INTRAVENOUS
  Administered 2012-01-01: 0.5 mg via INTRAVENOUS
  Administered 2012-01-01 – 2012-01-10 (×31): 0.625 mg via INTRAVENOUS
  Filled 2011-12-30 (×48): qty 0.5

## 2011-12-30 MED ORDER — SODIUM CHLORIDE 0.9 % IJ SOLN: 3.0000 mL | INTRAMUSCULAR | Status: DC | PRN

## 2011-12-30 MED ORDER — SODIUM CHLORIDE 0.9 % IV SOLN
250.0000 mL | INTRAVENOUS | Status: DC | PRN
Start: 2011-12-30 — End: 2012-01-03

## 2011-12-30 MED ORDER — SODIUM CHLORIDE 0.9 % IJ SOLN
3.0000 mL | Freq: Two times a day (BID) | INTRAMUSCULAR | Status: DC
  Administered 2012-01-01: 3 mL via INTRAVENOUS
  Administered 2012-01-01: 11:00:00 via INTRAVENOUS
  Administered 2012-01-02: 3 mL via INTRAVENOUS

## 2011-12-30 MED ORDER — SODIUM CHLORIDE 0.9 % IJ SOLN
3.0000 mL | Freq: Two times a day (BID) | INTRAMUSCULAR | Status: DC
  Administered 2012-01-01: 3 mL via INTRAVENOUS
  Administered 2012-01-01: 11:00:00 via INTRAVENOUS
  Administered 2012-01-02 – 2012-01-05 (×6): 3 mL via INTRAVENOUS
  Administered 2012-01-06: 22:00:00 via INTRAVENOUS
  Administered 2012-01-06 – 2012-01-18 (×20): 3 mL via INTRAVENOUS

## 2011-12-30 MED ORDER — SODIUM CHLORIDE 0.9 % IV SOLN
0.1900 mg/kg/h | INTRAVENOUS | Status: DC
  Administered 2011-12-30: 0.15 mg/kg/h via INTRAVENOUS
  Administered 2011-12-31: 0.19 mg/kg/h via INTRAVENOUS
  Filled 2011-12-30 (×2): qty 250

## 2011-12-30 MED ORDER — MAGNESIUM SULFATE 50 % IJ SOLN: 2.0000 g | Freq: Once | INTRAVENOUS | Status: DC

## 2011-12-30 MED ORDER — SODIUM CHLORIDE 0.9 % IV SOLN: 250.0000 mL | INTRAVENOUS | Status: DC | PRN

## 2011-12-30 NOTE — Progress Notes (Signed)
eLink Physician-Brief Progress Note Patient Name: Angel [name] DOB: 1966/01/10 MRN: [id number]  Date of Service  12/30/2011   HPI/Events of Note  Platelet  Level dropping consistently.    eICU Interventions  Will hold heparin Will start SCD for DVT prophylaxis.      Catha Brow 12/30/2011, 6:23 AM

## 2011-12-30 NOTE — Progress Notes (Signed)
VASCULAR LAB PRELIMINARY  PRELIMINARY  PRELIMINARY  PRELIMINARY  Bilateral lower extremity venous Dopplers completed.    Preliminary report:  There is no DVT or SVT noted in the bilateral lower extremities.  Sherren Kerns [place], 12/30/2011, 12:23 PM

## 2011-12-30 NOTE — Progress Notes (Signed)
Name: Angel [name] DOB: 1965-09-24 MRN: [id number] PCP: No primary provider on file. ADMIT DATE: 12/24/2011 LOS: 6  PCCM PROGRESS NOTE  Brief Patient Profile:46 YO man with no past medical history. His family states that he abuses some perscription drugs and some occasional alcohol. Smokes 2 PPD history. Daughter tried to call him all day but he was sleeping which is unusual for him. His friend found him shaking violently around 8 PM and called 911 who intubated in the field for agonal breathing. He was unresponsive at the time. He was brought to the ED and placed on hypothermia protocol. No ischemic EKG changes on ED arrival. UDS positive for cocaine and THC.    Best Practice: DVT: SQ Heparin >> 4/27 (due to low plat), ScD 4/7 and Argotraban (mod prob HIT) 4/27 > SUP: protonix Nutrition: Tube feed Glycemic control:  Sedation/analgesia: Cont sedation (off precedex 4/26)  Lines / Drains: ETT 4/21>>>  CVC 4/21>>>   Micro: Blood 4/21>>>HAEMOPHILUS INFLUENZAE Urine 4/21>>>Neg  Sputum 4/21>>>GNR and GPC - NORMAL FLORA     Lab 12/24/11 2011  LATICACIDVEN 9.1*   Recent Results (from the past 240 hour(s))  CULTURE, BLOOD (ROUTINE X 2)     Status: Normal (Preliminary result)   Collection Time   12/24/11  8:11 PM      Component Value Range Status Comment   Specimen Description BLOOD RIGHT ARM   Final    Special Requests BOTTLES DRAWN AEROBIC ONLY 5CC   Final    Culture  Setup Time [id number]   Final    Culture     Final    Value:        BLOOD CULTURE RECEIVED NO GROWTH TO DATE CULTURE WILL BE HELD FOR 5 DAYS BEFORE ISSUING A FINAL NEGATIVE REPORT   Report Status PENDING   Incomplete   URINE CULTURE     Status: Normal   Collection Time   12/24/11  8:28 PM      Component Value Range Status Comment   Specimen Description URINE, CATHETERIZED   Final    Special Requests NONE   Final    Culture  Setup Time [id number]   Final    Colony Count NO GROWTH   Final    Culture NO GROWTH    Final    Report Status 12/26/2011 FINAL   Final   CULTURE, BLOOD (ROUTINE X 2)     Status: Normal (Preliminary result)   Collection Time   12/24/11  8:48 PM      Component Value Range Status Comment   Specimen Description BLOOD LEFT HAND   Final    Special Requests BOTTLES DRAWN AEROBIC AND ANAEROBIC 10CC EA   Final    Culture  Setup Time [id number]   Final    Culture     Final    Value: HAEMOPHILUS INFLUENZAE     Note: BETA LACTAMASE NEGATIVE CRITICAL RESULT CALLED TO, READ BACK BY AND VERIFIED WITH: KAMALA GOODE 1450 12/27/11 BY KRAWS     Note: Gram Stain Report Called to,Read Back By and Verified With: CATHERINE COMAN @0218  ON 12/26/11 BY MCLET   Report Status PENDING   Incomplete   MRSA PCR SCREENING     Status: Normal   Collection Time   12/24/11 10:01 PM      Component Value Range Status Comment   MRSA by PCR NEGATIVE  NEGATIVE  Final   CULTURE, RESPIRATORY     Status: Normal   Collection Time   12/24/11  11:13 PM      Component Value Range Status Comment   Specimen Description TRACHEAL ASPIRATE   Final    Special Requests NONE   Final    Gram Stain     Final    Value: FEW WBC PRESENT,BOTH PMN AND MONONUCLEAR     NO SQUAMOUS EPITHELIAL CELLS SEEN     FEW GRAM POSITIVE COCCI IN PAIRS     MODERATE GRAM NEGATIVE RODS   Culture Non-Pathogenic Oropharyngeal-type Flora Isolated.   Final    Report Status 12/26/2011 FINAL   Final   CULTURE, RESPIRATORY     Status: Normal (Preliminary result)   Collection Time   12/29/11 11:26 AM      Component Value Range Status Comment   Specimen Description OTHER   Final    Special Requests ETT SUCTION   Final    Gram Stain     Final    Value: FEW WBC PRESENT,BOTH PMN AND MONONUCLEAR     NO SQUAMOUS EPITHELIAL CELLS SEEN     NO ORGANISMS SEEN   Culture PENDING   Incomplete    Report Status PENDING   Incomplete     Antibiotics: Anti-infectives     Start     Dose/Rate Route Frequency Ordered Stop   12/25/11 0600  piperacillin-tazobactam  (ZOSYN) IVPB 3.375 g       3.375 g 12.5 mL/hr over 240 Minutes Intravenous 3 times per day 12/24/11 2257     12/24/11 2359   vancomycin (VANCOCIN) IVPB 1000 mg/200 mL premix  Status:  Discontinued        1,000 mg 200 mL/hr over 60 Minutes Intravenous Daily at bedtime 12/24/11 2301 12/29/11 1009   12/24/11 2045  piperacillin-tazobactam (ZOSYN) 3.375 g in dextrose 5 % 50 mL IVPB       3.375 g 100 mL/hr over 30 Minutes Intravenous [facility] 12/24/11 2011 12/24/11 2206   12/24/11 2015   vancomycin (VANCOCIN) IVPB 1000 mg/200 mL premix  Status:  Discontinued        1,000 mg 200 mL/hr over 60 Minutes Intravenous  Once 12/24/11 2011 12/24/11 2301          Tests / Events:   Consults:   Subjective/Overnight/Interval History:  Extremely agitated on WUA. Moves all 4s. Intense Cough + Gag +. Corneals +.     Per RN: followed commands x 2 today  Per family: localized, had tear in eye at mention of youngest daughter   Vital Signs: Temp:  [98.4 F (36.9 C)-100.6 F (38.1 C)] 100.6 F (38.1 C) (04/27 0900) Pulse Rate:  [73-99] 86  (04/27 0900) Resp:  [20-27] 20  (04/27 0900) BP: (109-156)/(53-70) 156/68 mmHg (04/27 0838) SpO2:  [93 %-100 %] 96 % (04/27 0900) FiO2 (%):  [40 %-40.5 %] 40.2 % (04/27 0900) Weight:  [73.5 kg (162 lb 0.6 oz)] 73.5 kg (162 lb 0.6 oz) (04/27 0400) I/O last 3 completed shifts: In: 7076.9 [I.V.:2217.9; NG/GT:3910; IV Piggyback:949] Out: 6275 [Urine:6275]  Physical Examination: General: Critically ill looking male. On ventilator  Neuro: Extremely agitated on WUA. Moving all 4s. Cough +. Corneals +, REports that he followed commands +   HEENT:  corneals +. No jaundice Neck:    Intubated Cardiovascular:  Normal heart sounds. A Line + Chest: Cleart to ausculatation Lungs:  Normal chest Abdomen:  Soft, non tennder Musculoskeletal:   Normal joints Skin:  intact Extremities:No edema, no cyanosis, no clubbing.  Ventilator settings: Vent  Mode:  [-] PRVC FiO2 (%):  [  40 %-40.5 %] 40.2 % Set Rate:  [20 bmp] 20 bmp Vt Set:  [119 mL] 620 mL PEEP:  [5 cmH20] 5 cmH20 Plateau Pressure:  [20 cmH20-25 cmH20] 25 cmH20  Labs/Radiology: Please see A and P for full labs  Dg Chest Port 1 View  12/30/2011  *RADIOLOGY REPORT*  Clinical Data: Respiratory failure with ARDS and aspiration pneumonia.  PORTABLE CHEST - 1 VIEW  Comparison: 12/29/2011  Findings: Endotracheal tube remains present with tip approximately 3 cm above the carina.  Central line and nasogastric tube positioning are stable.  Aeration of the left lung shows improvement since prior study.  There remains airspace disease and pneumonia in the right upper lung.  No gross pulmonary edema.  IMPRESSION: Improved aeration of the left lung.  Persistent pneumonia in the right lung.  Original Report Authenticated By: Reola Calkins, M.D.   Dg Chest Port 1 View  12/29/2011  *RADIOLOGY REPORT*  Clinical Data: Endotracheal tube positioning  PORTABLE CHEST - 1 VIEW  Comparison: 12/28/2011  Findings: The endotracheal tube is stable in position, 4 cm above the level of the carina.  Right internal jugular CVP and nasogastric tube remain in place.  Lower lung volumes are present taking this into consideration the heart size is within normal limits. Increase in pulmonary vascular congestion is noted.  Persistent asymmetry of density in the right hemithorax is noted with new increased density at the left lung base and perihilar zones.  A veiling density overlying the right lower lung zones suggests the possibility of a posteriorly layering effusion.  This appearance may be the result of asymmetric pulmonary edema of new effusion with pneumonia or ARDS also possible.  IMPRESSION: Lower lung volumes with interval increase in bibasilar density and probable new posteriorly layering right effusion.  Stable support tubes  Original Report Authenticated By: Bertha Stakes, M.D.    ASSESSMENT AND PLAN 46  year old male with PMH of cocaine, etoh and drug abuse presenting after being found down in a hotel room positive for cocaine and THC. Patient was noted to have vomitus into his airway upon intubation. RUL and RML infiltrate noted. Patient was started on Abx and hypothermia as well ARDS protocol were started.   RESPIRATORY  Lab 12/30/11 0335 12/29/11 0349 12/28/11 1130 12/28/11 0942 12/28/11 0348  PHART 7.418 7.412 7.375 7.415 7.491*  PCO2ART 51.4* 41.9 40.2 34.2* 31.4*  PO2ART 91.0 104.0* 77.7* 121.0* 113.0*  HCO3 32.9* 26.6* 23.0 22.0 24.1*  O2SAT 97.0 98.0 94.4 99.0 99.0   A:  Acute Respiratory Failure due to cardiac arrest and pneumonia  On 4/27: does not meet sbt criteria due to agitation P: Full vent support  CARDIAC  Lab 12/26/11 0758 12/25/11 1342 12/25/11 0600 12/24/11 2149 12/24/11 2012  TROPONINI 12.01* 7.31* 1.47* 0.78* <0.30  PROBNP -- -- -- -- --   A: s/p cardiac arrest on 12/24/2011. S/p arctic sun.    P: per cardiology  NEUROLOGIC A:  S/p cardiac arrest. Due to coccaine and pneumonia.  Extreme agitation/delirium/encephalopathy.   On 4/27: Corneal +. Failed precedex 4/26 but appears to be followign commands per RN x 2 but overall extreme agitation. Ddx for agitation: anoxia, polysubstance withdrawal, and fever  P: Sedation gtt. Will try haldol. Appreciate neuro input  VASCULAR/HEMODYNAMIC A:  Shock due to sepsis/pneumonia and cardiac arrest  - On 4/27: off pressors since 12h P: Monitor, MAP goal > 65  INFECTIOUS DISEASE  Lab 12/30/11 0420 12/29/11 0400 12/28/11 0545 12/27/11 0350 12/26/11 0347  12/24/11 2149 12/24/11 2011  WBC 20.7* 23.7* 23.0* 14.1* 3.2* -- --  PROCALCITON -- -- -- -- -- 50.40 --  LATICACIDVEN -- -- -- -- -- -- 9.1*   A:  Hemophilus influenzae and aspiration pneumonia on 12/24/2011 date of admit   - On 4/27: still febrile to 101F P:  - check sepsis biomarkers and repeat cultures  RENAL  Lab 12/30/11 0420 12/29/11 0400 12/28/11 0545  12/27/11 0350 12/26/11 1600  BUN 34* 36* 40* 41* 37*  CREATININE 0.95 0.94 0.97 1.64* 1.42*   I/O last 3 completed shifts: In: 7076.9 [I.V.:2217.9; NG/GT:3910; IV Piggyback:949] Out: 6275 [Urine:6275] A:  Normal renal function  P:  Monitor  ELECTROLYTES  Lab 12/30/11 0420 12/29/11 0400 12/28/11 0545 12/27/11 0350 12/26/11 1600 12/26/11 0347  NA 142 145 147* 143 140 --  K 3.9 4.0 -- -- -- --  CL 102 110 112 108 108 --  CO2 31 26 24 21 20  --  BUN 34* 36* 40* 41* 37* --  CREATININE 0.95 0.94 0.97 1.64* 1.42* --  CALCIUM 8.2* 8.0* 8.0* 7.2* 7.0* --  MG 1.7 2.1 2.3 1.5 -- 1.7  PHOS 4.4 2.8 1.3* 3.2 -- 3.3   A: Hypomagnesemia on 4/27 P: replete  HEMATOLOGIC  Lab 12/30/11 0420 12/29/11 0400 12/28/11 0545 12/27/11 0350 12/26/11 0347 12/25/11 0355 12/24/11 2009  HGB 10.9* 10.9* 11.1* 13.6 16.0 -- --  HCT 31.1* 31.2* 31.2* 37.6* 44.3 -- --  PLT 65* 63* 80* 113* 120* -- --  INR -- -- -- -- -- 1.50* 1.32  APTT -- -- -- -- -- 40* --   A:    - Anemia of critical illness   - Thrombocytopenia with possible HIT (s/p Sq heparin 4/21  - 4/27 per Mariea Stable): HIT score 4 - low-mod prob (> 50% fall, < 5 days, and possible cause can exist) P:  - PRBC for hgb < 7gm%  - agree with hold heparin since 4/26 and use scd's  - check duplex 4/27  - Due to mod HIT score start argotraban  GASTROINTESTINAL  Lab 12/26/11 0347 12/25/11 0355 12/24/11 2147 12/24/11 2009  AST 308* -- 315* 187*  ALT 363* -- 341* 190*  ALKPHOS 55 -- 72 106  BILITOT 0.4 -- 0.3 0.2*  PROT 5.1* -- 5.0* 6.2  ALBUMIN 2.4* -- 2.7* 3.3*  INR -- 1.50* -- 1.32  AMYLASE -- -- -- --  LIPASE -- -- -- 24   A:  Shock liver  P: Monitor  ENDOCRINE  Lab 12/30/11 0744 12/30/11 0322 12/29/11 2346 12/29/11 1925 12/29/11 1533  GLUCAP 86 99 117* 101* 129*  TSH -- -- -- -- --  CORTISOL -- -- -- -- --   A:  None  P: sliding scale insulin  DERMATOLOGY A: no gluteal sores P: monitor    GLOBAL:   Family appear to be in  spiritual distrss. Dtrs, mom and ex-wife updated 4/27. Note given that he is critically ill  Adrielle Polakowski 12/30/2011, 9:46 AM  The patient is critically ill with multiple organ systems failure and requires high complexity decision making for assessment and support, frequent evaluation and titration of therapies, application of advanced monitoring technologies and extensive interpretation of multiple databases.   Critical Care Time devoted to patient care services described in this note is 60  Minutes.  Dr. Kalman Shan, M.D., [facility].C.P Pulmonary and Critical Care Medicine Staff Physician [facility] Pager: [phone], If no answer or between  15:00h - 7:00h: call [phone]  12/30/2011 10:12 AM

## 2011-12-30 NOTE — Progress Notes (Signed)
ANTICOAGULATION CONSULT NOTE - Follow Up Consult  Pharmacy Consult for Angiomax Indication: r/o HIT  Labs:  Basename 12/30/11 2000 12/30/11 1500 12/30/11 0420 12/29/11 0400 12/28/11 0545  HGB -- -- 10.9* 10.9* --  HCT -- -- 31.1* 31.2* 31.2*  PLT -- -- 65* 63* 80*  APTT 44* 69* -- -- --  LABPROT -- -- -- -- --  INR -- -- -- -- --  HEPARINUNFRC -- -- -- -- --  CREATININE -- -- 0.95 0.94 0.97  CKTOTAL -- -- -- -- --  CKMB -- -- -- -- --  TROPONINI -- -- -- -- --    Assessment: 46yo male now subtherapeutic on Angiomax after one therapeutic PTT.  Goal of Therapy:  aPTT 50-85   Plan:  Will increase Angiomax gtt by 20% to 0.19 mg/kg/hr (27.9 ml/hr) and check aPTT in 4hr.  Colleen Can PharmD BCPS 12/30/2011,11:32 PM

## 2011-12-30 NOTE — Progress Notes (Signed)
Angel Bottoms, MD, [facility] ABIM Board Certified in Adult Cardiovascular Medicine,Internal Medicine and Critical Care Medicine    SUBJECTIVE  Moving all four extremities, responds to simple commands but now sedated because of agitation. Neosynephrine has been weaned off. Opens eyes spontanously. Intubated.   Active Problems:  Acute respiratory failure  Pneumonia, organism unspecified  Acidosis  Altered mental status  Cardiac arrest  Anoxic encephalopathy   Past Medical History  Diagnosis Date  . No significant past medical history     Current Facility-Administered Medications  Medication Dose Route Frequency Provider Last Rate Last Dose  . 0.9 %  sodium chloride infusion  250 mL Intravenous PRN Darnelle Maffucci, MD 10 mL/hr at 12/29/11 2012 10 mL/hr at 12/29/11 2012  . 0.9 %  sodium chloride infusion   Intravenous Continuous Alyson Reedy, MD 20 mL/hr at 12/29/11 2013 20 mL/hr at 12/29/11 2013  . acetaminophen (TYLENOL) solution 650 mg  650 mg Oral Q6H PRN Alyson Reedy, MD   650 mg at 12/28/11 1437  . antiseptic oral rinse (BIOTENE) solution 15 mL  15 mL Mouth Rinse QID Rolan Bucco, MD   15 mL at 12/30/11 0400  . chlorhexidine (PERIDEX) 0.12 % solution 15 mL  15 mL Mouth Rinse BID Rolan Bucco, MD   15 mL at 12/30/11 0800  . feeding supplement (OXEPA) liquid 1,000 mL  1,000 mL Per Tube Continuous Tonye Becket, RD 60 mL/hr at 12/29/11 1723 1,000 mL at 12/29/11 1723  . feeding supplement (PRO-STAT SUGAR FREE 64) liquid 30 mL  30 mL Per Tube BID Tonye Becket, RD   30 mL at 12/30/11 1009  . fentaNYL (SUBLIMAZE) 10 mcg/mL in sodium chloride 0.9 % 250 mL infusion  25-400 mcg/hr Intravenous Continuous Nyoka Cowden, MD   150 mcg/hr at 12/30/11 0700  . folic acid injection 1 mg  1 mg Intravenous Daily Alyson Reedy, MD   1 mg at 12/29/11 1157  . free water 250 mL  250 mL Per Tube Q4H Alyson Reedy, MD   250 mL at 12/30/11 0600  . furosemide (LASIX) injection 40 mg  40 mg  Intravenous Q8H Alyson Reedy, MD   40 mg at 12/29/11 1959  . insulin aspart (novoLOG) injection 0-9 Units  0-9 Units Subcutaneous Q4H Alyson Reedy, MD   4 Units at 12/30/11 0800  . midazolam (VERSED) 1 mg/mL in sodium chloride 0.9 % 50 mL infusion  1-10 mg/hr Intravenous Continuous Nyoka Cowden, MD   3 mg/hr at 12/30/11 0700  . mulitivitamin liquid 5 mL  5 mL Oral Daily Alyson Reedy, MD   5 mL at 12/30/11 1008  . norepinephrine (LEVOPHED) 16,000 mcg in dextrose 5 % 250 mL infusion  2-50 mcg/min Intravenous Titrated Alyson Reedy, MD 1.9 mL/hr at 12/30/11 0153 2 mcg/min at 12/30/11 0153  . pantoprazole sodium (PROTONIX) 40 mg/20 mL oral suspension 40 mg  40 mg Per Tube Q1200 Darnelle Maffucci, MD   40 mg at 12/29/11 1155  . piperacillin-tazobactam (ZOSYN) IVPB 3.375 g  3.375 g Intravenous Q8H Orbie Hurst, MD   3.375 g at 12/30/11 0530  . potassium phosphate 30 mmol in dextrose 5 % 500 mL infusion  30 mmol Intravenous Once Alyson Reedy, MD   30 mmol at 12/29/11 1155  . thiamine (B-1) injection 100 mg  100 mg Intravenous Daily Alyson Reedy, MD   100 mg at 12/30/11 1007  . DISCONTD: dexmedetomidine (PRECEDEX) 200  mcg in sodium chloride 0.9 % 50 mL infusion  0.2-1.2 mcg/kg/hr Intravenous Continuous Alyson Reedy, MD   1.2 mcg/kg/hr at 12/29/11 1700  . DISCONTD: fentaNYL (SUBLIMAZE) injection 25-50 mcg  25-50 mcg Intravenous Q2H PRN Alyson Reedy, MD      . DISCONTD: fentaNYL (SUBLIMAZE) injection 50-100 mcg  50-100 mcg Intravenous Q2H PRN Alyson Reedy, MD      . DISCONTD: free water 250 mL  250 mL Per Tube Q6H Alyson Reedy, MD   250 mL at 12/29/11 0939  . DISCONTD: heparin injection 5,000 Units  5,000 Units Subcutaneous Q8H Darnelle Maffucci, MD   5,000 Units at 12/29/11 2125  . DISCONTD: midazolam (VERSED) injection 2-4 mg  2-4 mg Intravenous Q2H PRN Alyson Reedy, MD      . DISCONTD: midazolam (VERSED) injection 2-4 mg  2-4 mg Intravenous Q2H PRN Alyson Reedy, MD   2 mg at 12/29/11 1615     LABS: Basic Metabolic Panel:  Basename 12/30/11 0420 12/29/11 0400  NA 142 145  K 3.9 4.0  CL 102 110  CO2 31 26  GLUCOSE 123* 101*  BUN 34* 36*  CREATININE 0.95 0.94  CALCIUM 8.2* 8.0*  MG 1.7 2.1  PHOS 4.4 2.8    CBC:  Basename 12/30/11 0420 12/29/11 0400  WBC 20.7* 23.7*  NEUTROABS -- --  HGB 10.9* 10.9*  HCT 31.1* 31.2*  MCV 93.1 92.6  PLT 65* 63*    Cardiac Enzymes: No results found for this basename: CKTOTAL:3,CKMB:3,CKMBINDEX:3,TROPONINI:3 in the last 72 hours  BNP (last 3 results) No results found for this basename: PROBNP:3 in the last 8760 hours  RADIOLOGY: Dg Chest Port 1 View  12/30/2011  *RADIOLOGY REPORT*  Clinical Data: Respiratory failure with ARDS and aspiration pneumonia.  PORTABLE CHEST - 1 VIEW  Comparison: 12/29/2011  Findings: Endotracheal tube remains present with tip approximately 3 cm above the carina.  Central line and nasogastric tube positioning are stable.  Aeration of the left lung shows improvement since prior study.  There remains airspace disease and pneumonia in the right upper lung.  No gross pulmonary edema.  IMPRESSION: Improved aeration of the left lung.  Persistent pneumonia in the right lung.  Original Report Authenticated By: Reola Calkins, M.D.   Dg Chest Port 1 View  12/29/2011  *RADIOLOGY REPORT*  Clinical Data: Endotracheal tube positioning  PORTABLE CHEST - 1 VIEW  Comparison: 12/28/2011  Findings: The endotracheal tube is stable in position, 4 cm above the level of the carina.  Right internal jugular CVP and nasogastric tube remain in place.  Lower lung volumes are present taking this into consideration the heart size is within normal limits. Increase in pulmonary vascular congestion is noted.  Persistent asymmetry of density in the right hemithorax is noted with new increased density at the left lung base and perihilar zones.  A veiling density overlying the right lower lung zones suggests the possibility of a posteriorly  layering effusion.  This appearance may be the result of asymmetric pulmonary edema of new effusion with pneumonia or ARDS also possible.  IMPRESSION: Lower lung volumes with interval increase in bibasilar density and probable new posteriorly layering right effusion.  Stable support tubes  Original Report Authenticated By: Bertha Stakes, M.D.      PHYSICAL EXAM  Filed Vitals:   12/30/11 0700 12/30/11 0800 12/30/11 0838 12/30/11 0900  BP:   156/68   Pulse: 81 84 99 86  Temp: 99.7 F (37.6 C)  99.7 F (37.6 C)  100.6 F (38.1 C)  TempSrc:      Resp: 20 20  20   Height:      Weight:      SpO2: 99% 99% 96% 96%   BP 156/68  Pulse 86  Temp(Src) 100.6 F (38.1 C) (Core (Comment))  Resp 20  Ht 6' (1.829 m)  Wt 162 lb 0.6 oz (73.5 kg)  BMI 21.98 kg/m2  SpO2 96%  Intake/Output Summary (Last 24 hours) at 12/30/11 1015 Last data filed at 12/30/11 0900  Gross per 24 hour  Intake 4546.9 ml  Output   5155 ml  Net -608.1 ml   Vent Mode:  [-] PRVC FiO2 (%):  [40 %-40.5 %] 40.2 % Set Rate:  [20 bmp] 20 bmp Vt Set:  [620 mL] 620 mL PEEP:  [5 cmH20] 5 cmH20 Plateau Pressure:  [20 cmH20-25 cmH20] 25 cmH20  I/O last 3 completed shifts: In: 7076.9 [I.V.:2217.9; NG/GT:3910; IV Piggyback:949] Out: 6275 [Urine:6275] General: intubated , well sedated HEENT: normal carotid upstroke. No bruits Cardiac: RRR, NL S1/S2. No pathologic murmurs Lungs: clear BS bilaterally, no wheezing. Abdomen, soft, non- tender Extremities. No edema. Normal distal pulses Skin: warm and dry Psychologic: sedated Neurologic: equivocal babinski.  TELEMETRY: Reviewed telemetry pt in  NSR  ECHO: Left ventricle: The cavity size was normal. Systolic function was severely reduced. The estimated ejection fraction was in the range of 20% to 25%. Diffuse hypokinesis. Doppler parameters are consistent with abnormal left ventricular relaxation (grade 1 diastolic dysfunction). - Right ventricle: The cavity size  was mildly dilated. Systolic function was moderately reduced.    ASSESSMENT AND PLAN:  1. Cardiac arrest s/p arctic sun PEA arrest  2. Respiratory acidosis   3. Elevated lactic acid level   4. Elevated troponin   5. Cocaine abuse   6. Acidosis   7. Acute respiratory failure   8. Altered mental status   9. Anoxic encephalopathy   10. No significant past medical history   11. Pneumonia, organism unspecified      Patient Active Problem List  Diagnoses  . Acute respiratory failure- intubated   . Pneumonia, organism unspecified  . Acidosis  . Altered mental status  . Cardiac arrest -severe biventricular failure with EF=25% non-ischemic vs ischemic cardiomyopathy vs. Mixed.   Marland Kitchen Anoxic encephalopathy-severe diffuse cerebral dysfunction- moves all 4 extremities and respond to simple commands.      PLAN   Start low dose IV enalapril 0.625mg  IV q 6hours.   BNP level  Cardiac issues to be further addressed when neurologic improvement and liberated from ventilator.   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    Alvin Critchley [facility] 12/30/2011, 10:15 AM

## 2011-12-30 NOTE — Progress Notes (Signed)
Direct Thrombin Inhibitor CONSULT NOTE - Initial Consult  Pharmacy Consult for - Bivalirudin  Indication: - R/O- Heparin Induced Thrombocytopenia    No Known Allergies  Patient Measurements: Height: 6' (182.9 cm) Weight: 162 lb 0.6 oz (73.5 kg) IBW/kg (Calculated) : 77.6   Vital Signs: Temp: 99.3 F (37.4 C) (04/27 1400) BP: 122/64 mmHg (04/27 1205) Pulse Rate: 85  (04/27 1547)  Labs:  Basename 12/30/11 1500 12/30/11 0420 12/29/11 0400 12/28/11 0545  HGB -- 10.9* 10.9* --  HCT -- 31.1* 31.2* 31.2*  PLT -- 65* 63* 80*  APTT 69* -- -- --  LABPROT -- -- -- --  INR -- -- -- --  HEPARINUNFRC -- -- -- --  CREATININE -- 0.95 0.94 0.97  CKTOTAL -- -- -- --  CKMB -- -- -- --  TROPONINI -- -- -- --   Estimated Creatinine Clearance: 101 ml/min (by C-G formula based on Cr of 0.95).  Medical History: Past Medical History  Diagnosis Date  . No significant past medical history     Medications:  Scheduled:    . antiseptic oral rinse  15 mL Mouth Rinse QID  . chlorhexidine  15 mL Mouth Rinse BID  . feeding supplement  30 mL Per Tube BID  . folic acid  1 mg Intravenous Daily  . free water  250 mL Per Tube Q4H  . furosemide  40 mg Intravenous Q8H  . insulin aspart  0-9 Units Subcutaneous Q4H  . mulitivitamin  5 mL Oral Daily  . pantoprazole sodium  40 mg Per Tube Q1200  . piperacillin-tazobactam (ZOSYN)  IV  3.375 g Intravenous Q8H  . potassium phosphate IVPB (mmol)  30 mmol Intravenous Once  . sodium chloride  3 mL Intravenous Q12H  . sodium chloride  3 mL Intravenous Q12H  . thiamine  100 mg Intravenous Daily  . DISCONTD: heparin  5,000 Units Subcutaneous Q8H   Assessment: Patient started on SQ Heparin 4/22 for DVT prophylaxis and has  received  doses every eight hours for the last 5 days.  Platelets on admission 4/21 = 155K and summary of drop as follows:  4/21 - Initially 155K 4/22 - Start Heparin 5K q8 (1st dose not given until after AM labs) Plt. = 142K 4/23 -  120K 4/24 - 113K 4/25 - 80K 4/26 - 63K 4/27 - Today = 65K He presented with leukopenia as well, which was thought to be possibly related to cocaine use.  His LFT's were elevated with possible hepatitis or liver dysfunction. First aPTT therpaeutic at 69 sec.  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  Will use patient's actual body weight. 2.  Continue angiomax at present rate. 3.  Check aPTT  q4h until therapeutic x 2, then daily. 4.  Monitor for bleeding complications 5.  F/u HIT panel and discontinue therapy if patient has negative HIT panel.  Glenice Bow Clinical Pharmacist Pager:  [phone] Thank you for allowing pharmacy to be part of this patients care team. 12/30/2011,3:48 PM

## 2011-12-30 NOTE — Procedures (Signed)
Mini BAL Procedure Note Angel [name] [id number] 08-20-1966  Procedure: Mini Bronchial Alveolar Lavage  Procedure Details: In preparation for procedure, Patient hyper-oxygenated with 100 % FiO2 Sterile Technique used:gloves, gown and mask Amount of Saline administered: 40 (ml) Specimen amount collected: 10 (ml)  Evaluation: BP 122/64  Pulse 84  Temp(Src) 100.6 F (38.1 C) (Core (Comment))  Resp 20  Ht 6' (1.829 m)  Wt 162 lb 0.6 oz (73.5 kg)  BMI 21.98 kg/m2  SpO2 98% O2 sats: stable throughout Breath Sounds: Rhonch Patient's Current Condition: stable Complications: No apparent complications Patient did tolerate procedure well.   Adolm Joseph 12/30/2011, 12:55 PM

## 2011-12-30 NOTE — Progress Notes (Addendum)
Direct Thrombin Inhibitor CONSULT NOTE - Initial Consult  Pharmacy Consult for - Bivalirudin  Indication: - R/O- Heparin Induced Thrombocytopenia    No Known Allergies  Patient Measurements: Height: 6' (182.9 cm) Weight: 162 lb 0.6 oz (73.5 kg) IBW/kg (Calculated) : 77.6   Vital Signs: Temp: 100.6 F (38.1 C) (04/27 0900) BP: 156/68 mmHg (04/27 0838) Pulse Rate: 86  (04/27 0900)  Labs:  Basename 12/30/11 0420 12/29/11 0400 12/28/11 0545  HGB 10.9* 10.9* --  HCT 31.1* 31.2* 31.2*  PLT 65* 63* 80*  APTT -- -- --  LABPROT -- -- --  INR -- -- --  HEPARINUNFRC -- -- --  CREATININE 0.95 0.94 0.97  CKTOTAL -- -- --  CKMB -- -- --  TROPONINI -- -- --   Estimated Creatinine Clearance: 101 ml/min (by C-G formula based on Cr of 0.95).  Medical History: Past Medical History  Diagnosis Date  . No significant past medical history     Medications:  Scheduled:    . antiseptic oral rinse  15 mL Mouth Rinse QID  . chlorhexidine  15 mL Mouth Rinse BID  . feeding supplement  30 mL Per Tube BID  . folic acid  1 mg Intravenous Daily  . free water  250 mL Per Tube Q4H  . furosemide  40 mg Intravenous Q8H  . insulin aspart  0-9 Units Subcutaneous Q4H  . mulitivitamin  5 mL Oral Daily  . pantoprazole sodium  40 mg Per Tube Q1200  . piperacillin-tazobactam (ZOSYN)  IV  3.375 g Intravenous Q8H  . potassium phosphate IVPB (mmol)  30 mmol Intravenous Once  . sodium chloride  3 mL Intravenous Q12H  . sodium chloride  3 mL Intravenous Q12H  . thiamine  100 mg Intravenous Daily  . DISCONTD: heparin  5,000 Units Subcutaneous Q8H   Assessment: Patient started on SQ Heparin 4/22 for DVT prophylaxis and has  received  doses every eight hours for the last 5 days.  Platelets on admission 4/21 = 155K and summary of drop as follows:  4/21 - Initially 155K 4/22 - Start Heparin 5K q8 (1st dose not given until after AM labs) Plt. = 142K 4/23 - 120K 4/24 - 113K 4/25 - 80K 4/26 - 63K 4/27 -  Today = 65K He presented with leukopenia as well, which was thought to be possibly related to cocaine use.  His LFT's were elevated with possible hepatitis or liver dysfunction.  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  Will use patient's actual body weight. 2.  For CrCl > 60 mL/min will start Bivalirudin at 0.15 mg/kg/hr 3.  Check aPTT at 2 hours after initiation then q4h until therapeutic x 2, then daily. 4.  Monitor for bleeding complications 5.  F/u HIT panel and discontinue therapy if patient has negative HIT panel.  Nadara Mustard, PharmD., MS Clinical Pharmacist Pager:  [phone] Thank you for allowing pharmacy to be part of this patients care team. 12/30/2011,10:42 AM

## 2011-12-30 NOTE — Consult Note (Addendum)
Subjective: Patient is stable.   Objective: Vital signs in last 24 hours: Temp:  [98.4 F (36.9 C)-100.6 F (38.1 C)] 99.7 F (37.6 C) (04/27 0700) Pulse Rate:  [73-97] 81  (04/27 0700) Resp:  [20-27] 20  (04/27 0700) BP: (109-150)/(53-70) 109/53 mmHg (04/27 0257) SpO2:  [93 %-100 %] 99 % (04/27 0700) FiO2 (%):  [40 %-40.5 %] 40 % (04/27 0700) Weight:  [73.5 kg (162 lb 0.6 oz)] 73.5 kg (162 lb 0.6 oz) (04/27 0400)  Intake/Output from previous day: 04/26 0701 - 04/27 0700 In: 5051.4 [I.V.:1449.9; NG/GT:2940; IV Piggyback:661.5] Out: 5400 [Urine:5400]   Nutritional status: NPO  Neurological exam: arousable by sternal rub, resists eye opening, looks at examiner, blinks to threat b/l, follows commands to turn his head to the other side, but would not move arms or legs including to pain, face symmetric  Lab Results:  Basename 12/30/11 0420 12/29/11 0400  WBC 20.7* 23.7*  HGB 10.9* 10.9*  HCT 31.1* 31.2*  PLT 65* 63*  NA 142 145  K 3.9 4.0  CL 102 110  CO2 31 26  GLUCOSE 123* 101*  BUN 34* 36*  CREATININE 0.95 0.94  CALCIUM 8.2* 8.0*  LABA1C -- --   Studies/Results: Dg Chest Port 1 View  12/29/2011  *RADIOLOGY REPORT*  Clinical Data: Endotracheal tube positioning  PORTABLE CHEST - 1 VIEW  Comparison: 12/28/2011  Findings: The endotracheal tube is stable in position, 4 cm above the level of the carina.  Right internal jugular CVP and nasogastric tube remain in place.  Lower lung volumes are present taking this into consideration the heart size is within normal limits. Increase in pulmonary vascular congestion is noted.  Persistent asymmetry of density in the right hemithorax is noted with new increased density at the left lung base and perihilar zones.  A veiling density overlying the right lower lung zones suggests the possibility of a posteriorly layering effusion.  This appearance may be the result of asymmetric pulmonary edema of new effusion with pneumonia or ARDS also  possible.  IMPRESSION: Lower lung volumes with interval increase in bibasilar density and probable new posteriorly layering right effusion.  Stable support tubes  Original Report Authenticated By: Bertha Stakes, M.D.   Medications:  I have reviewed the patient's current medications.  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 Of note, patient's EEG was done with patient not on any sedation, so the finding of severe diffuse cerebral dysfunction may be indicative of a poor prognosis, but it is hard to get a good examination at this time due to sedation.  1) Will follow clinically until off sedation  LOS: 6 days   Doriann Zuch

## 2011-12-30 NOTE — Progress Notes (Signed)
TO WHOM IT MAY CONCERN  Dated 12/30/2011   This is to state that Mr Angel [name] Dob 06/19/1966 is a patient at [facility] in [place], [place]. He is critically ill and survival chances are guarded. This is to state that his family members need to excused from school or work on as needed basis depending on his condition. This needs to be reviewed weekly. Please do not hesitate to contact if you have any questions.  Sincerely yours,  Dr. Kalman Shan, M.D., [facility].C.P Pulmonary and Critical Care Medicine Staff Physician [facility] [address] [place], [address]  12/30/2011 10:19 AM

## 2011-12-31 ENCOUNTER — Inpatient Hospital Stay (HOSPITAL_COMMUNITY): Payer: Medicaid Other

## 2011-12-31 DIAGNOSIS — D696 Thrombocytopenia, unspecified: Secondary | ICD-10-CM

## 2011-12-31 DIAGNOSIS — M79609 Pain in unspecified limb: Secondary | ICD-10-CM

## 2011-12-31 LAB — BASIC METABOLIC PANEL
BUN: 37 mg/dL — ABNORMAL HIGH (ref 6–23)
CO2: 30 mEq/L (ref 19–32)
Calcium: 8.6 mg/dL (ref 8.4–10.5)
Creatinine, Ser: 0.89 mg/dL (ref 0.50–1.35)
GFR calc non Af Amer: >90 mL/min (ref >90)
Glucose, Bld: 85 mg/dL (ref 70–99)

## 2011-12-31 LAB — GLUCOSE, CAPILLARY
Glucose-Capillary: 109 mg/dL — ABNORMAL HIGH (ref 70–99)
Glucose-Capillary: 114 mg/dL — ABNORMAL HIGH (ref 70–99)

## 2011-12-31 LAB — CBC
HCT: 29.7 % — ABNORMAL LOW (ref 39.0–52.0)
Hemoglobin: 10.2 g/dL — ABNORMAL LOW (ref 13.0–17.0)
MCH: 32.3 pg (ref 26.0–34.0)
MCHC: 34.3 g/dL (ref 30.0–36.0)
RDW: 14.8 % (ref 11.5–15.5)

## 2011-12-31 LAB — APTT
aPTT: 80 seconds — ABNORMAL HIGH (ref 24–37)
aPTT: 81 seconds — ABNORMAL HIGH (ref 24–37)

## 2011-12-31 LAB — CULTURE, BLOOD (ROUTINE X 2): Culture: NO GROWTH

## 2011-12-31 LAB — URINE CULTURE
Colony Count: NO GROWTH
Culture: NO GROWTH

## 2011-12-31 MED ORDER — SODIUM CHLORIDE 0.9 % IV SOLN
0.1800 mg/kg/h | INTRAVENOUS | Status: DC
  Administered 2011-12-31: 0.18 mg/kg/h via INTRAVENOUS
  Filled 2011-12-31: qty 250

## 2011-12-31 MED ORDER — POTASSIUM CHLORIDE 20 MEQ/15ML (10%) PO LIQD
40.0000 meq | Freq: Two times a day (BID) | ORAL | Status: DC
  Administered 2011-12-31: 40 meq
  Filled 2011-12-31 (×2): qty 30

## 2011-12-31 MED ORDER — POTASSIUM CHLORIDE 20 MEQ/15ML (10%) PO LIQD
ORAL | Status: AC
  Administered 2011-12-31: 40 meq
  Filled 2011-12-31: qty 30

## 2011-12-31 MED ORDER — FUROSEMIDE 10 MG/ML IJ SOLN
40.0000 mg | Freq: Three times a day (TID) | INTRAMUSCULAR | Status: DC
  Administered 2011-12-31 – 2012-01-01 (×3): 40 mg via INTRAVENOUS
  Filled 2011-12-31 (×6): qty 4

## 2011-12-31 MED ORDER — POTASSIUM CHLORIDE 20 MEQ/15ML (10%) PO LIQD
40.0000 meq | Freq: Two times a day (BID) | ORAL | Status: DC
  Administered 2011-12-31 – 2012-01-01 (×3): 40 meq
  Filled 2011-12-31 (×5): qty 30

## 2011-12-31 MED ORDER — IMIPENEM-CILASTATIN 500 MG IV SOLR
500.0000 mg | Freq: Three times a day (TID) | INTRAVENOUS | Status: DC
  Administered 2011-12-31 – 2012-01-01 (×2): 500 mg via INTRAVENOUS
  Filled 2011-12-31 (×7): qty 500

## 2011-12-31 MED ORDER — VANCOMYCIN HCL 1000 MG IV SOLR
1250.0000 mg | Freq: Two times a day (BID) | INTRAVENOUS | Status: DC
  Administered 2011-12-31 – 2012-01-03 (×6): 1250 mg via INTRAVENOUS
  Filled 2011-12-31 (×8): qty 1250

## 2011-12-31 MED ORDER — SODIUM CHLORIDE 0.9 % IV SOLN
0.1800 mg/kg/h | INTRAVENOUS | Status: DC
  Administered 2011-12-31 – 2012-01-01 (×2): 0.18 mg/kg/h via INTRAVENOUS
  Filled 2011-12-31 (×4): qty 250

## 2011-12-31 MED ORDER — HALOPERIDOL LACTATE 5 MG/ML IJ SOLN
5.0000 mg | Freq: Four times a day (QID) | INTRAMUSCULAR | Status: DC
  Administered 2011-12-31 – 2012-01-11 (×37): 5 mg via INTRAVENOUS
  Filled 2011-12-31 (×51): qty 1

## 2011-12-31 NOTE — Progress Notes (Signed)
Direct Thrombin Inhibitor CONSULT NOTE - Follow Up Consult  Pharmacy Consult for - Bivalirudin  Indication: - R/O- Heparin Induced Thrombocytopenia    Assessment: 45 YOM on angiomax infusion for r/o HIT, angiomax was restarted at around 4:30 this afternoon, at 0.18 mg/kg/hr, 4 hr aPTT level (75 seconds) therapeutic. 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  Continue IV Bivalirudin at 0.18 mg/kg/hr 3.  recheck aPTT in 4 hrs to confirm 4.  Monitor for bleeding complications 5.  F/u HIT panel and discontinue therapy if patient has negative HIT panel.  Bayard Hugger, PharmD, BCPS  Clinical Pharmacist  Pager: [phone]   Thank you for allowing pharmacy to be part of this patients care team. 12/31/2011,9:42 PM

## 2011-12-31 NOTE — Consult Note (Signed)
Subjective: Patient is stable.   Objective: Vital signs in last 24 hours: Temp:  [98.5 F (36.9 C)-100.6 F (38.1 C)] 100.2 F (37.9 C) (04/28 0401) Pulse Rate:  [74-99] 83  (04/28 0401) Resp:  [20-27] 20  (04/28 0401) BP: (102-156)/(61-69) 102/61 mmHg (04/28 0401) SpO2:  [91 %-99 %] 97 % (04/28 0401) FiO2 (%):  [39.7 %-40.5 %] 40 % (04/28 0401) Weight:  [76.5 kg (168 lb 10.4 oz)] 76.5 kg (168 lb 10.4 oz) (04/28 0500)  Intake/Output from previous day: 04/27 0701 - 04/28 0700 In: 3203.4 [I.V.:1783.4; NG/GT:1320; IV Piggyback:100] Out: 2370 [Urine:2350; Emesis/NG output:20] Intake/Output this shift:   Nutritional status: NPO  Neurological exam: arousable by sternal rub, resists eye opening, looks at examiner, blinks to threat b/l, follows commands to turn squeeze fingers intermittently, but would not move arms to pain, moved legs spontaneously  Lab Results:  Basename 12/31/11 0709 12/30/11 0420 12/29/11 0400  WBC 15.4* 20.7* --  HGB 10.2* 10.9* --  HCT 29.7* 31.1* --  PLT 85* 65* --  NA -- 142 145  K -- 3.9 4.0  CL -- 102 110  CO2 -- 31 26  GLUCOSE -- 123* 101*  BUN -- 34* 36*  CREATININE -- 0.95 0.94  CALCIUM -- 8.2* 8.0*  LABA1C -- -- --   Studies/Results: Dg Chest Port 1 View  12/30/2011  *RADIOLOGY REPORT*  Clinical Data: Respiratory failure with ARDS and aspiration pneumonia.  PORTABLE CHEST - 1 VIEW  Comparison: 12/29/2011  Findings: Endotracheal tube remains present with tip approximately 3 cm above the carina.  Central line and nasogastric tube positioning are stable.  Aeration of the left lung shows improvement since prior study.  There remains airspace disease and pneumonia in the right upper lung.  No gross pulmonary edema.  IMPRESSION: Improved aeration of the left lung.  Persistent pneumonia in the right lung.  Original Report Authenticated By: Reola Calkins, M.D.   Medications: I have reviewed the patient's current  medications.  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 Of note, patient's EEG was done with patient not on any sedation, so the finding of severe diffuse cerebral dysfunction may be indicative of a poor prognosis, but it is hard to get a good examination at this time due to sedation.  1) Will follow clinically until off sedation   LOS: 7 days   Travia Onstad

## 2011-12-31 NOTE — Progress Notes (Signed)
Bilateral upper extremity venous duplex completed.  Preliminary report is negative for DVT.  There appears to be superficial thrombosis in the cephalic vein bilaterally.

## 2011-12-31 NOTE — Progress Notes (Signed)
ANTIBIOTIC CONSULT NOTE - INITIAL  Pharmacy Consult for Vancomycin/Imipenem  Indication: Persistent fever/Leukocytosis    No Known Allergies  Patient Measurements: Height: 6' (182.9 cm) Weight: 168 lb 10.4 oz (76.5 kg) IBW/kg (Calculated) : 77.6   Vital Signs: Temp: 99.5 F (37.5 C) (04/28 1000) Temp src: Oral (04/28 0752) BP: 117/71 mmHg (04/28 1000) Pulse Rate: 90  (04/28 1000) Intake/Output from previous day: 04/27 0701 - 04/28 0700 In: 3203.4 [I.V.:1783.4; NG/GT:1320; IV Piggyback:100] Out: 2370 [Urine:2350; Emesis/NG output:20]  Labs:  [facility] 12/31/11 0709 12/30/11 0420 12/29/11 0400  WBC 15.4* 20.7* 23.7*  HGB 10.2* 10.9* 10.9*  PLT 85* 65* 63*  LABCREA -- -- --  CREATININE -- 0.95 0.94   Estimated Creatinine Clearance: 105.1 ml/min (by C-G formula based on Cr of 0.95).  Microbiology: Recent Results (from the past 720 hour(s))  CULTURE, BLOOD (ROUTINE X 2)     Status: Normal   Collection Time   12/24/11  8:11 PM      Component Value Range Status Comment   Specimen Description BLOOD RIGHT ARM   Final    Special Requests BOTTLES DRAWN AEROBIC ONLY 5CC   Final    Culture  Setup Time [id number]   Final    Culture NO GROWTH 5 DAYS   Final    Report Status 12/31/2011 FINAL   Final   URINE CULTURE     Status: Normal   Collection Time   12/24/11  8:28 PM      Component Value Range Status Comment   Specimen Description URINE, CATHETERIZED   Final    Special Requests NONE   Final    Culture  Setup Time [id number]   Final    Colony Count NO GROWTH   Final    Culture NO GROWTH   Final    Report Status 12/26/2011 FINAL   Final   CULTURE, BLOOD (ROUTINE X 2)     Status: Normal (Preliminary result)   Collection Time   12/24/11  8:48 PM      Component Value Range Status Comment   Specimen Description BLOOD LEFT HAND   Final    Special Requests BOTTLES DRAWN AEROBIC AND ANAEROBIC 10CC EA   Final    Culture  Setup Time [id number]   Final    Culture     Final    Value: HAEMOPHILUS INFLUENZAE     Note: BETA LACTAMASE NEGATIVE CRITICAL RESULT CALLED TO, READ BACK BY AND VERIFIED WITH: KAMALA GOODE 1450 12/27/11 BY KRAWS     Note: Gram Stain Report Called to,Read Back By and Verified With: CATHERINE COMAN @0218  ON 12/26/11 BY MCLET   Report Status PENDING   Incomplete   MRSA PCR SCREENING     Status: Normal   Collection Time   12/24/11 10:01 PM      Component Value Range Status Comment   MRSA by PCR NEGATIVE  NEGATIVE  Final   CULTURE, RESPIRATORY     Status: Normal   Collection Time   12/24/11 11:13 PM      Component Value Range Status Comment   Specimen Description TRACHEAL ASPIRATE   Final    Special Requests NONE   Final    Gram Stain     Final    Value: FEW WBC PRESENT,BOTH PMN AND MONONUCLEAR     NO SQUAMOUS EPITHELIAL CELLS SEEN     FEW GRAM POSITIVE COCCI IN PAIRS     MODERATE GRAM NEGATIVE RODS   Culture Non-Pathogenic Oropharyngeal-type Flora Isolated.  Final    Report Status 12/26/2011 FINAL   Final   CULTURE, RESPIRATORY     Status: Normal (Preliminary result)   Collection Time   12/29/11 11:26 AM      Component Value Range Status Comment   Specimen Description OTHER   Final    Special Requests ETT SUCTION   Final    Gram Stain     Final    Value: FEW WBC PRESENT,BOTH PMN AND MONONUCLEAR     NO SQUAMOUS EPITHELIAL CELLS SEEN     NO ORGANISMS SEEN   Culture Culture reincubated for better growth   Final    Report Status PENDING   Incomplete   CULTURE, BLOOD (ROUTINE X 2)     Status: Normal (Preliminary result)   Collection Time   12/30/11 11:08 AM      Component Value Range Status Comment   Specimen Description BLOOD ARM RIGHT   Final    Special Requests BOTTLES DRAWN AEROBIC AND ANAEROBIC 10CC EACH   Final    Culture  Setup Time [id number]   Final    Culture     Final    Value:        BLOOD CULTURE RECEIVED NO GROWTH TO DATE CULTURE WILL BE HELD FOR 5 DAYS BEFORE ISSUING A FINAL NEGATIVE REPORT   Report Status PENDING    Incomplete   CULTURE, BLOOD (ROUTINE X 2)     Status: Normal (Preliminary result)   Collection Time   12/30/11 11:20 AM      Component Value Range Status Comment   Specimen Description BLOOD ARM RIGHT   Final    Special Requests BOTTLES DRAWN AEROBIC ONLY 10CC    Final    Culture  Setup Time [id number]   Final    Culture     Final    Value:        BLOOD CULTURE RECEIVED NO GROWTH TO DATE CULTURE WILL BE HELD FOR 5 DAYS BEFORE ISSUING A FINAL NEGATIVE REPORT   Report Status PENDING   Incomplete   CULTURE, BAL-QUANTITATIVE     Status: Normal (Preliminary result)   Collection Time   12/30/11 12:51 PM      Component Value Range Status Comment   Specimen Description BRONCHIAL ALVEOLAR LAVAGE   Final    Special Requests NONE   Final    Gram Stain PENDING   Incomplete    Colony Count PENDING   Incomplete    Culture NO GROWTH   Final    Report Status PENDING   Incomplete     Medical History: Past Medical History  Diagnosis Date  . No significant past medical history     Medications:  Anti-infectives     Start     Dose/Rate Route Frequency Ordered Stop   12/25/11 0600   piperacillin-tazobactam (ZOSYN) IVPB 3.375 g  Status:  Discontinued        3.375 g 12.5 mL/hr over 240 Minutes Intravenous 3 times per day 12/24/11 2257 12/31/11 1028   12/24/11 2359   vancomycin (VANCOCIN) IVPB 1000 mg/200 mL premix  Status:  Discontinued        1,000 mg 200 mL/hr over 60 Minutes Intravenous Daily at bedtime 12/24/11 2301 12/29/11 1009   12/24/11 2045  piperacillin-tazobactam (ZOSYN) 3.375 g in dextrose 5 % 50 mL IVPB       3.375 g 100 mL/hr over 30 Minutes Intravenous To [facility] 12/24/11 2011 12/24/11 2206   12/24/11 2015   vancomycin (  VANCOCIN) IVPB 1000 mg/200 mL premix  Status:  Discontinued        1,000 mg 200 mL/hr over 60 Minutes Intravenous  Once 12/24/11 2011 12/24/11 2301         Assessment: 46 yo admitted with cardiac arrest, placed on hypothermia protocol but has  since been rewarmed.  He required intubation and sedation and cxr revealed pneumonia which was felt to be aspiration.  He also had a positive drug screen for cocaine and THC.    He has had Vancomycin and Pip/Tazo with culture data that revealed only Haemophilus Influenzae.  His Vancomycin was stopped and he has remained on IV Pip/Tazo.  Today, he has fever (100.6), and ongoing leukocytosis - although a downward trend.  Yesterday his cxr revealed an improvement in aeration of the left lung but persistent pneumonia in the right lung.  We have been asked to restart IV Vancomycin and initiate Imipenem in light of ongoing fever and leukocytosis.  Goal of Therapy:  Vancomycin trough 10-20 mcg/ml  Plan:  1.  Will give Vancomycin 1250 mg IV every 12 hours. 2.  Start IV Imipenem 500 mg every 8 hours 3.  Monitor renal function and UOP. 4.  F/U culture data and clinical response.  Nadara Mustard, PharmD., MS Clinical Pharmacist Pager:  [phone] Thank you for allowing pharmacy to be part of this patients care team. 12/31/2011,11:01 AM

## 2011-12-31 NOTE — Progress Notes (Signed)
eLink Physician-Brief Progress Note Patient Name: Angel [name] DOB: Oct 15, 1965 MRN: [id number]  Date of Service  12/31/2011   HPI/Events of Note  Reviewed Upper extremity dopplers and that he has cephalic superficial vein clot. But no DVT.  AFter my rounds, angiomax stopped due to concern of hemoptysis. 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 Hemoptysis not causing change in pulse ox or fio2 needs or hgb drop. Also fever and high bnp as causative factors +   Lab 12/31/11 0709 12/30/11 0420 12/29/11 0400 12/28/11 0545 12/27/11 0350  HGB 10.2* 10.9* 10.9* 11.1* 13.6      eICU Interventions  Diurese Antibiotics Restart angiomax -> dc if significant bleeding or HIT negative  D/w Dr Delton Coombes of CCM as well   Intervention Category Major Interventions: Other:  Danen Lapaglia 12/31/2011, 4:27 PM

## 2011-12-31 NOTE — Progress Notes (Signed)
Angel Bottoms, MD, [facility] ABIM Board Certified in Adult Cardiovascular Medicine,Internal Medicine and Critical Care Medicine    SUBJECTIVE: Minimally responsive, moving all 4 ext. Intubated.Bloody secretions during suctioning.    Past Medical History  Diagnosis Date  . No significant past medical history     Current Facility-Administered Medications  Medication Dose Route Frequency Provider Last Rate Last Dose  . 0.9 %  sodium chloride infusion  250 mL Intravenous PRN Darnelle Maffucci, MD 10 mL/hr at 12/29/11 2012 10 mL/hr at 12/29/11 2012  . 0.9 %  sodium chloride infusion   Intravenous Continuous Alyson Reedy, MD 20 mL/hr at 12/29/11 2013 20 mL/hr at 12/29/11 2013  . 0.9 %  sodium chloride infusion  250 mL Intravenous PRN Chinita Greenland, PHARMD      . 0.9 %  sodium chloride infusion  250 mL Intravenous PRN Kalman Shan, MD      . acetaminophen (TYLENOL) solution 650 mg  650 mg Oral Q6H PRN Alyson Reedy, MD   650 mg at 12/28/11 1437  . antiseptic oral rinse (BIOTENE) solution 15 mL  15 mL Mouth Rinse QID Rolan Bucco, MD   15 mL at 12/31/11 0400  . bivalirudin (ANGIOMAX) 0.5 mg/mL in sodium chloride 0.9 % 500 mL infusion  0.19 mg/kg/hr Intravenous Continuous Colleen Can, PHARMD 27.9 mL/hr at 12/31/11 1102 0.19 mg/kg/hr at 12/31/11 1102  . chlorhexidine (PERIDEX) 0.12 % solution 15 mL  15 mL Mouth Rinse BID Rolan Bucco, MD   15 mL at 12/31/11 0800  . enalaprilat (VASOTEC) injection 0.625 mg  0.625 mg Intravenous Q6H June Leap, MD   0.625 mg at 12/31/11 0504  . feeding supplement (OXEPA) liquid 1,000 mL  1,000 mL Per Tube Continuous Tonye Becket, RD 60 mL/hr at 12/31/11 0105 1,000 mL at 12/31/11 0105  . feeding supplement (PRO-STAT SUGAR FREE 64) liquid 30 mL  30 mL Per Tube BID Tonye Becket, RD   30 mL at 12/31/11 0937  . fentaNYL (SUBLIMAZE) 10 mcg/mL in sodium chloride 0.9 % 250 mL infusion  25-400 mcg/hr Intravenous Continuous Nyoka Cowden, MD 30  mL/hr at 12/31/11 0941 300 mcg/hr at 12/31/11 0941  . folic acid injection 1 mg  1 mg Intravenous Daily Alyson Reedy, MD   1 mg at 12/31/11 1610  . free water 250 mL  250 mL Per Tube Q4H Alyson Reedy, MD   250 mL at 12/31/11 1000  . furosemide (LASIX) injection 40 mg  40 mg Intravenous Q8H Kalman Shan, MD      . haloperidol lactate (HALDOL) injection 5 mg  5 mg Intravenous Q6H Kalman Shan, MD      . imipenem-cilastatin (PRIMAXIN) 500 mg in sodium chloride 0.9 % 100 mL IVPB  500 mg Intravenous Q8H Nita Altamese G[place]t, PHARMD      . insulin aspart (novoLOG) injection 0-9 Units  0-9 Units Subcutaneous Q4H Alyson Reedy, MD   1 Units at 12/29/11 1604  . magnesium sulfate IVPB 2 g 50 mL  2 g Intravenous Once Alyson Reedy, MD   2 g at 12/30/11 1307  . midazolam (VERSED) 1 mg/mL in sodium chloride 0.9 % 50 mL infusion  1-10 mg/hr Intravenous Continuous Nyoka Cowden, MD 6 mL/hr at 12/31/11 1102 6 mg/hr at 12/31/11 1102  . mulitivitamin liquid 5 mL  5 mL Oral Daily Alyson Reedy, MD   5 mL at 12/31/11 0937  . pantoprazole sodium (PROTONIX)  40 mg/20 mL oral suspension 40 mg  40 mg Per Tube Q1200 Darnelle Maffucci, MD   40 mg at 12/30/11 1305  . potassium chloride 20 MEQ/15ML (10%) liquid 40 mEq  40 mEq Per Tube BID Kalman Shan, MD      . sodium chloride 0.9 % injection 3 mL  3 mL Intravenous Q12H Nita Altamese Welling, PHARMD      . sodium chloride 0.9 % injection 3 mL  3 mL Intravenous PRN Nita Altamese Gage, PHARMD      . sodium chloride 0.9 % injection 3 mL  3 mL Intravenous Q12H Kalman Shan, MD      . sodium chloride 0.9 % injection 3 mL  3 mL Intravenous PRN Kalman Shan, MD      . thiamine (B-1) injection 100 mg  100 mg Intravenous Daily Alyson Reedy, MD   100 mg at 12/31/11 0938  . vancomycin (VANCOCIN) 1,250 mg in sodium chloride 0.9 % 250 mL IVPB  1,250 mg Intravenous Q12H Chinita Greenland, PHARMD      . DISCONTD: piperacillin-tazobactam (ZOSYN) IVPB 3.375 g  3.375 g  Intravenous Q8H Orbie Hurst, MD   3.375 g at 12/31/11 0505    LABS: Basic Metabolic Panel:  Basename 12/30/11 0420 12/29/11 0400  NA 142 145  K 3.9 4.0  CL 102 110  CO2 31 26  GLUCOSE 123* 101*  BUN 34* 36*  CREATININE 0.95 0.94  CALCIUM 8.2* 8.0*  MG 1.7 2.1  PHOS 4.4 2.8    CBC:  Basename 12/31/11 0709 12/30/11 0420  WBC 15.4* 20.7*  NEUTROABS -- --  HGB 10.2* 10.9*  HCT 29.7* 31.1*  MCV 94.0 93.1  PLT 85* 65*    Cardiac Enzymes: No results found for this basename: CKTOTAL:3,CKMB:3,CKMBINDEX:3,TROPONINI:3 in the last 72 hours  BNP (last 3 results)  Basename 12/30/11 1100  PROBNP 2799.0*    RADIOLOGY: Dg Chest Port 1 View  12/31/2011  *RADIOLOGY REPORT*  Clinical Data: Acute respiratory failure.  PORTABLE CHEST - 1 VIEW  Comparison: Yesterday  Findings: Endotracheal tube in satisfactory position.  Right jugular catheter tip in the superior vena cava.  Nasogastric tube extending into the stomach.  Normal sized heart.  Decreased airspace opacity in the right upper lung zone and left apical region.  Interval scattered airspace opacities elsewhere in both lungs.  Unremarkable bones.  IMPRESSION:  1.  Improving bilateral upper lobe pneumonia. 2.  Interval scattered airspace opacities in both lungs.  These could represent areas of alveolar edema or developing pneumonia.  Original Report Authenticated By: Darrol Angel, M.D.   Dg Chest Port 1 View  12/30/2011  *RADIOLOGY REPORT*  Clinical Data: Respiratory failure with ARDS and aspiration pneumonia.  PORTABLE CHEST - 1 VIEW  Comparison: 12/29/2011  Findings: Endotracheal tube remains present with tip approximately 3 cm above the carina.  Central line and nasogastric tube positioning are stable.  Aeration of the left lung shows improvement since prior study.  There remains airspace disease and pneumonia in the right upper lung.  No gross pulmonary edema.  IMPRESSION: Improved aeration of the left lung.  Persistent pneumonia in  the right lung.  Original Report Authenticated By: Reola Calkins, M.D.      PHYSICAL EXAM  Filed Vitals:   12/31/11 0800 12/31/11 0856 12/31/11 0900 12/31/11 1000  BP: 108/57 108/57 106/62 117/71  Pulse: 89 91 88 90  Temp: 100.6 F (38.1 C)  100.2 F (37.9 C) 99.5 F (37.5 C)  TempSrc:  Resp: 20 20 20 19   Height:      Weight:      SpO2: 96% 97% 96% 99%   BP 117/71  Pulse 90  Temp(Src) 99.5 F (37.5 C) (Oral)  Resp 19  Ht 6' (1.829 m)  Wt 168 lb 10.4 oz (76.5 kg)  BMI 22.87 kg/m2  SpO2 99%  Intake/Output Summary (Last 24 hours) at 12/31/11 1127 Last data filed at 12/31/11 0500  Gross per 24 hour  Intake 3033.4 ml  Output   2350 ml  Net  683.4 ml   I/O last 3 completed shifts: In: 5575.5 [I.V.:2685.5; NG/GT:2790; IV Piggyback:100] Out: 5020 [Urine:5000; Emesis/NG output:20]  General: intubated , well sedated  HEENT: normal carotid upstroke. No bruits  Cardiac: RRR, NL S1/S2. No pathologic murmurs  Lungs: clear BS bilaterally, no wheezing.  Abdomen, soft, non- tender  Extremities. No edema. Normal distal pulses  Skin: warm and dry  Psychologic: sedated  Neurologic: equivocal babinski.  TELEMETRY: Reviewed telemetry pt in NSR  ASSESSMENT AND PLAN:  1.  Cardiac arrest s/p arctic sun PEA arrest   2.  Respiratory acidosis   3.  Elevated lactic acid level   4.  Elevated troponin   5.  Cocaine abuse   6.  Acidosis   7.  Acute respiratory failure   8.  Altered mental status   9.  Anoxic encephalopathy   10.  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  Acute respiratory failure- intubated   .  Pneumonia, organism unspecified   .  Acidosis   .  Altered mental status   .  Cardiac arrest -severe biventricular failure with EF=25% non-ischemic vs ischemic cardiomyopathy vs. Mixed.   Marland Kitchen  Anoxic encephalopathy-severe diffuse cerebral dysfunction- moves all 4  extremities and respond to simple commands.     PLAN   Tolerating IV enalapril  Continue lasix for now  Cardiac issues to be further addressed when neurologic improvement and liberated from ventilator. 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   D/C angiomax- bloody secretions. Don't see definite indication at this point =-DVT prophylaxis with pneumatics.      Alvin Critchley [facility] 12/31/2011, 11:27 AM

## 2011-12-31 NOTE — Plan of Care (Signed)
Problem: Phase I Progression Outcomes Goal: Other Phase I Outcomes/Goals Variance: Not applicable

## 2011-12-31 NOTE — Progress Notes (Signed)
Direct Thrombin Inhibitor CONSULT NOTE - Follow Up Consult  Pharmacy Consult for - Bivalirudin  Indication: - R/O- Heparin Induced Thrombocytopenia    No Known Allergies  Patient Measurements: Height: 6' (182.9 cm) Weight: 168 lb 10.4 oz (76.5 kg) IBW/kg (Calculated) : 77.6   Vital Signs: Temp: 99.7 F (37.6 C) (04/28 1500) Temp src: Oral (04/28 0752) BP: 135/59 mmHg (04/28 1602) Pulse Rate: 89  (04/28 1602)  Labs:  Basename 12/31/11 1226 12/31/11 0745 12/31/11 0709 12/31/11 0400 12/30/11 2000 12/30/11 0420 12/29/11 0400  HGB -- -- 10.2* -- -- 10.9* --  HCT -- -- 29.7* -- -- 31.1* 31.2*  PLT -- -- 85* -- -- 65* 63*  APTT -- 81* -- 80* 44* -- --  LABPROT -- -- -- -- -- -- --  INR -- -- -- -- -- -- --  HEPARINUNFRC -- -- -- -- -- -- --  CREATININE 0.89 -- -- -- -- 0.95 0.94  CKTOTAL -- -- -- -- -- -- --  CKMB -- -- -- -- -- -- --  TROPONINI -- -- -- -- -- -- --   Estimated Creatinine Clearance: 112.2 ml/min (by C-G formula based on Cr of 0.89).  Medical History: Past Medical History  Diagnosis Date  . No significant past medical history    Medications:  Scheduled:    . antiseptic oral rinse  15 mL Mouth Rinse QID  . chlorhexidine  15 mL Mouth Rinse BID  . feeding supplement  30 mL Per Tube BID  . folic acid  1 mg Intravenous Daily  . free water  250 mL Per Tube Q4H  . furosemide  40 mg Intravenous Q8H  . insulin aspart  0-9 Units Subcutaneous Q4H  . mulitivitamin  5 mL Oral Daily  . pantoprazole sodium  40 mg Per Tube Q1200  . piperacillin-tazobactam (ZOSYN)  IV  3.375 g Intravenous Q8H  . potassium phosphate IVPB (mmol)  30 mmol Intravenous Once  . sodium chloride  3 mL Intravenous Q12H  . sodium chloride  3 mL Intravenous Q12H  . thiamine  100 mg Intravenous Daily   Assessment: Patient started on SQ Heparin 4/22 for DVT prophylaxis and has  received  doses every eight hours for the last 5 days.  Platelets on admission 4/21 = 155K and summary of drop as  follows:  4/21 - Initially 155K 4/22 - Start Heparin 5K q8 (1st dose not given until after AM labs) Plt. = 142K 4/23 - 120K 4/24 - 113K 4/25 - 80K 4/26 - 63K 4/27 - 65K 4/28 - 85k  He presented with leukopenia as well, which was thought to be possibly related to cocaine use.  His LFT's were elevated with possible hepatitis or liver dysfunction.  HIT panel in progress.  Platelets better today at 85K << 65K. Bivalirudin was held this am d/t bloody sputum, now to resume. Previously on 0.19mg /kg/hr, aPTT = 81,  high-end therapeutic range  Goal of Therapy:  Target aPTT range of 50-85 seconds (1.5-2.5 x control)   Plan:  Bivalirudin (Angiomax) Dosing for HIT 1.  Restart IV Bivalirudin at 0.18 mg/kg/hr 3.  Check aPTT in 4 hrs 4.  Monitor for bleeding complications 5.  F/u HIT panel and discontinue therapy if patient has negative HIT panel.  Bayard Hugger, PharmD, BCPS  Clinical Pharmacist  Pager: [phone]   Thank you for allowing pharmacy to be part of this patients care team. 12/31/2011,4:27 PM

## 2011-12-31 NOTE — Progress Notes (Signed)
Name: Angel [name] DOB: 28-Jan-1966 MRN: [id number] PCP: No primary provider on file. ADMIT DATE: 12/24/2011 LOS: 7  PCCM PROGRESS NOTE  Brief Patient Profile:46 YO man with no past medical history. His family states that he abuses some perscription drugs and some occasional alcohol. Smokes 2 PPD history. Daughter tried to call him all day but he was sleeping which is unusual for him. His friend found him shaking violently around 8 PM and called 911 who intubated in the field for agonal breathing. He was unresponsive at the time. He was brought to the ED and placed on hypothermia protocol. No ischemic EKG changes on ED arrival. UDS positive for cocaine and THC.    Best Practice: DVT: SQ Heparin >> 4/27 (due to low plat), ScD 4/7 and Argotraban (mod prob HIT) 4/27 > SUP: protonix Nutrition: Tube feed Glycemic control:  Sedation/analgesia: Cont sedation (off precedex 4/26)  Lines / Drains: ETT 4/21>>>  CVC 4/21>>>   Micro: Blood 4/21>>>HAEMOPHILUS INFLUENZAE Urine 4/21>>>Neg  Sputum 4/21>>>GNR and GPC - NORMAL FLORA ....................... [facility] 4/27 BAL 4/27  Urine 4/27    Lab 12/30/11 1002 12/24/11 2011  LATICACIDVEN 0.9 9.1*   Recent Results (from the past 240 hour(s))  CULTURE, BLOOD (ROUTINE X 2)     Status: Normal (Preliminary result)   Collection Time   12/24/11  8:11 PM      Component Value Range Status Comment   Specimen Description BLOOD RIGHT ARM   Final    Special Requests BOTTLES DRAWN AEROBIC ONLY 5CC   Final    Culture  Setup Time [id number]   Final    Culture     Final    Value:        BLOOD CULTURE RECEIVED NO GROWTH TO DATE CULTURE WILL BE HELD FOR 5 DAYS BEFORE ISSUING A FINAL NEGATIVE REPORT   Report Status PENDING   Incomplete   URINE CULTURE     Status: Normal   Collection Time   12/24/11  8:28 PM      Component Value Range Status Comment   Specimen Description URINE, CATHETERIZED   Final    Special Requests NONE   Final    Culture  Setup Time  [id number]   Final    Colony Count NO GROWTH   Final    Culture NO GROWTH   Final    Report Status 12/26/2011 FINAL   Final   CULTURE, BLOOD (ROUTINE X 2)     Status: Normal (Preliminary result)   Collection Time   12/24/11  8:48 PM      Component Value Range Status Comment   Specimen Description BLOOD LEFT HAND   Final    Special Requests BOTTLES DRAWN AEROBIC AND ANAEROBIC 10CC EA   Final    Culture  Setup Time [id number]   Final    Culture     Final    Value: HAEMOPHILUS INFLUENZAE     Note: BETA LACTAMASE NEGATIVE CRITICAL RESULT CALLED TO, READ BACK BY AND VERIFIED WITH: KAMALA GOODE 1450 12/27/11 BY KRAWS     Note: Gram Stain Report Called to,Read Back By and Verified With: CATHERINE COMAN @0218  ON 12/26/11 BY MCLET   Report Status PENDING   Incomplete   MRSA PCR SCREENING     Status: Normal   Collection Time   12/24/11 10:01 PM      Component Value Range Status Comment   MRSA by PCR NEGATIVE  NEGATIVE  Final   CULTURE, RESPIRATORY  Status: Normal   Collection Time   12/24/11 11:13 PM      Component Value Range Status Comment   Specimen Description TRACHEAL ASPIRATE   Final    Special Requests NONE   Final    Gram Stain     Final    Value: FEW WBC PRESENT,BOTH PMN AND MONONUCLEAR     NO SQUAMOUS EPITHELIAL CELLS SEEN     FEW GRAM POSITIVE COCCI IN PAIRS     MODERATE GRAM NEGATIVE RODS   Culture Non-Pathogenic Oropharyngeal-type Flora Isolated.   Final    Report Status 12/26/2011 FINAL   Final   CULTURE, RESPIRATORY     Status: Normal (Preliminary result)   Collection Time   12/29/11 11:26 AM      Component Value Range Status Comment   Specimen Description OTHER   Final    Special Requests ETT SUCTION   Final    Gram Stain     Final    Value: FEW WBC PRESENT,BOTH PMN AND MONONUCLEAR     NO SQUAMOUS EPITHELIAL CELLS SEEN     NO ORGANISMS SEEN   Culture PENDING   Incomplete    Report Status PENDING   Incomplete     Antibiotics: Anti-infectives     Start      Dose/Rate Route Frequency Ordered Stop   12/25/11 0600  piperacillin-tazobactam (ZOSYN) IVPB 3.375 g       3.375 g 12.5 mL/hr over 240 Minutes Intravenous 3 times per day 12/24/11 2257     12/24/11 2359   vancomycin (VANCOCIN) IVPB 1000 mg/200 mL premix  Status:  Discontinued        1,000 mg 200 mL/hr over 60 Minutes Intravenous Daily at bedtime 12/24/11 2301 12/29/11 1009   12/24/11 2045  piperacillin-tazobactam (ZOSYN) 3.375 g in dextrose 5 % 50 mL IVPB       3.375 g 100 mL/hr over 30 Minutes Intravenous To [facility] 12/24/11 2011 12/24/11 2206   12/24/11 2015   vancomycin (VANCOCIN) IVPB 1000 mg/200 mL premix  Status:  Discontinued        1,000 mg 200 mL/hr over 60 Minutes Intravenous  Once 12/24/11 2011 12/24/11 2301          Tests / Events:  4/27 - HIT panel 4/28: agitated on wUA but followed commands.    Consults:   Subjective/Overnight/Interval History:  Extremely agitated on WUA. Moves all 4s. Intense Cough + Gag +. Corneals +. Per RN: followed commands x today to neurologist  Per family on 4/27: localized, had tear in eye at mention of youngest daughter  Copious pink secretions from ET tube +  Vital Signs: Temp:  [98.5 F (36.9 C)-100.6 F (38.1 C)] 100.6 F (38.1 C) (04/28 0800) Pulse Rate:  [74-92] 91  (04/28 0856) Resp:  [20-27] 20  (04/28 0856) BP: (102-128)/(57-69) 108/57 mmHg (04/28 0856) SpO2:  [91 %-99 %] 97 % (04/28 0856) FiO2 (%):  [39.7 %-40.5 %] 40 % (04/28 0856) Weight:  [76.5 kg (168 lb 10.4 oz)] 76.5 kg (168 lb 10.4 oz) (04/28 0500) I/O last 3 completed shifts: In: 5575.5 [I.V.:2685.5; NG/GT:2790; IV Piggyback:100] Out: 5020 [Urine:5000; Emesis/NG output:20]  Physical Examination: General: Critically ill looking male. On ventilator  Neuro: Extremely agitated on WUA. Moving all 4s. Cough +. Corneals +, REports that he followed commands +   HEENT:  corneals +. No jaundice Neck:    Intubated Cardiovascular:  Normal heart  sounds.+ Chest: Cleart to ausculatation Lungs:  Normal chest Abdomen:  Soft, non tennder Musculoskeletal:   Normal joints Skin:  intact Extremities:No edema, no cyanosis, no clubbing.  Ventilator settings: Vent Mode:  [-] PRVC FiO2 (%):  [39.7 %-40.5 %] 40 % Set Rate:  [20 bmp] 20 bmp Vt Set:  [161 mL] 620 mL PEEP:  [5 cmH20-5.2 cmH20] 5 cmH20 Plateau Pressure:  [16 cmH20-24 cmH20] 21 cmH20  Labs/Radiology: Please see A and P for full labs  Dg Chest Port 1 View  12/30/2011  *RADIOLOGY REPORT*  Clinical Data: Respiratory failure with ARDS and aspiration pneumonia.  PORTABLE CHEST - 1 VIEW  Comparison: 12/29/2011  Findings: Endotracheal tube remains present with tip approximately 3 cm above the carina.  Central line and nasogastric tube positioning are stable.  Aeration of the left lung shows improvement since prior study.  There remains airspace disease and pneumonia in the right upper lung.  No gross pulmonary edema.  IMPRESSION: Improved aeration of the left lung.  Persistent pneumonia in the right lung.  Original Report Authenticated By: Reola Calkins, M.D.    ASSESSMENT AND PLAN 46 year old male with PMH of cocaine, etoh and drug abuse presenting after being found down in a hotel room positive for cocaine and THC. Patient was noted to have vomitus into his airway upon intubation. RUL and RML infiltrate noted. Patient was started on Abx and hypothermia as well ARDS protocol were started.   RESPIRATORY  Lab 12/30/11 0335 12/29/11 0349 12/28/11 1130 12/28/11 0942 12/28/11 0348  PHART 7.418 7.412 7.375 7.415 7.491*  PCO2ART 51.4* 41.9 40.2 34.2* 31.4*  PO2ART 91.0 104.0* 77.7* 121.0* 113.0*  HCO3 32.9* 26.6* 23.0 22.0 24.1*  O2SAT 97.0 98.0 94.4 99.0 99.0   A:  Acute Respiratory Failure due to cardiac arrest and pneumonia  On 4/28 : does not meet sbt criteria due to agitation and also having copious pink secretions from ET tube P: Full vent support  CARDIAC  Lab 12/30/11  1100 12/26/11 0758 12/25/11 1342 12/25/11 0600 12/24/11 2149 12/24/11 2012  TROPONINI -- 12.01* 7.31* 1.47* 0.78* <0.30  PROBNP 2799.0* -- -- -- -- --   A: s/p cardiac arrest on 12/24/2011. S/p arctic sun.   ECHO 4/22: Diffuse cardiomyopathy and systolic CHF.  -  BNP high on 0/96 and red secretions from ET tube likely acute pulmonary edema  P: per cardiology  - start lasix 4/28 - mioght need repeat echo  NEUROLOGIC A:  S/p cardiac arrest. Due to coccaine and pneumonia.  Extreme agitation/delirium/encephalopathy. . Failed precedex 4/26  On 4/28: Stil with ongoing agitation and encpephalopathy. Followed commands per neuro. Ddx for agitation: anoxia, polysubstance withdrawal, and fever  P: Sedation gtt. Will try haldol starting 4/28. Appreciate neuro input  VASCULAR/HEMODYNAMIC A:  Shock due to sepsis/pneumonia and cardiac arrest  - On 4/27: off pressors since 12h P: Monitor, MAP goal > 65  INFECTIOUS DISEASE  Lab 12/31/11 0709 12/30/11 1002 12/30/11 0420 12/29/11 0400 12/28/11 0545 12/27/11 0350 12/24/11 2149 12/24/11 2011  WBC 15.4* -- 20.7* 23.7* 23.0* 14.1* -- --  PROCALCITON -- 6.51 -- -- -- -- 50.40 --  LATICACIDVEN -- 0.9 -- -- -- -- -- 9.1*   A:  Hemophilus influenzae and aspiration pneumonia on 12/24/2011 date of admit   - On 4/27: still febrile to 101F despite zosyn PCT 6  Lactate normal. Pan culture done  - On 4/28 - still febrile P:  - change zosyn to vanc and imipenem on 12/31/2011   RENAL  Lab 12/30/11 0420 12/29/11 0400 12/28/11 0545  12/27/11 0350 12/26/11 1600  BUN 34* 36* 40* 41* 37*  CREATININE 0.95 0.94 0.97 1.64* 1.42*   I/O last 3 completed shifts: In: 5575.5 [I.V.:2685.5; NG/GT:2790; IV Piggyback:100] Out: 5020 [Urine:5000; Emesis/NG output:20] A:  Normal renal function  P:  Monitor  ELECTROLYTES  Lab 12/30/11 0420 12/29/11 0400 12/28/11 0545 12/27/11 0350 12/26/11 1600 12/26/11 0347  NA 142 145 147* 143 140 --  K 3.9 4.0 -- -- -- --  CL 102 110  112 108 108 --  CO2 31 26 24 21 20  --  BUN 34* 36* 40* 41* 37* --  CREATININE 0.95 0.94 0.97 1.64* 1.42* --  CALCIUM 8.2* 8.0* 8.0* 7.2* 7.0* --  MG 1.7 2.1 2.3 1.5 -- 1.7  PHOS 4.4 2.8 1.3* 3.2 -- 3.3   A: Hypomagnesemia on 4/27 and repleted P: recheck labs 4/28  HEMATOLOGIC  Lab 12/31/11 0745 12/31/11 0709 12/31/11 0400 12/30/11 2000 12/30/11 1500 12/30/11 0420 12/29/11 0400 12/28/11 0545 12/27/11 0350 12/25/11 0355 12/24/11 2009  HGB -- 10.2* -- -- -- 10.9* 10.9* 11.1* 13.6 -- --  HCT -- 29.7* -- -- -- 31.1* 31.2* 31.2* 37.6* -- --  PLT -- 85* -- -- -- 65* 63* 80* 113* -- --  INR -- -- -- -- -- -- -- -- -- 1.50* 1.32  APTT 81* -- 80* 44* 69* -- -- -- -- 40* --   A:    - Anemia of critical illness   - Thrombocytopenia with possible HIT (s/p Sq heparin 4/21  - 4/27 per Mariea Stable): HIT score 4 - low-mod prob on 4/27.  Rx angiomax since 4/27. Duplex LE negative 4/27  P:  - PRBC for hgb < 7gm%  - agree with hold heparin since 4/26 and use scd's  - check UE duplex 4/28  - Continune angiomax since 4/27 - stop when HIT returns negative  GASTROINTESTINAL  Lab 12/26/11 0347 12/25/11 0355 12/24/11 2147 12/24/11 2009  AST 308* -- 315* 187*  ALT 363* -- 341* 190*  ALKPHOS 55 -- 72 106  BILITOT 0.4 -- 0.3 0.2*  PROT 5.1* -- 5.0* 6.2  ALBUMIN 2.4* -- 2.7* 3.3*  INR -- 1.50* -- 1.32  AMYLASE -- -- -- --  LIPASE -- -- -- 24   A:  Shock liver  P: Monitor  ENDOCRINE  Lab 12/31/11 0751 12/31/11 0316 12/30/11 2345 12/30/11 2010 12/30/11 1604  GLUCAP 96 109* 88 102* 100*  TSH -- -- -- -- --  CORTISOL -- -- -- -- --   A:  None  P: sliding scale insulin  DERMATOLOGY A: Stage 1 on both big toes and heels. And Stage 2 on sacrum with black center - present since admit per RN P:  Wound care consult called 4/28. Continue air mattress overlay     GLOBAL:   Dr Marchelle Gearing on 4/27: Family appear to be in spiritual distrss. Dtrs, mom and ex-wife updated 4/27.   On 4/28: nurse  expressing family says that theere is some deadline decision to be made by 5/1. Will need to explore what this 4/29  [facility] 12/31/2011, 10:10 AM  The patient is critically ill with multiple organ systems failure and requires high complexity decision making for assessment and support, frequent evaluation and titration of therapies, application of advanced monitoring technologies and extensive interpretation of multiple databases.   Critical Care Time devoted to patient care services described in this note is 60  Minutes.  Dr. Kalman Shan, M.D., [facility].C.P Pulmonary and Critical Care Medicine Staff  Physician [facility] Pager: [phone], If no answer or between  15:00h - 7:00h: call [phone]  12/31/2011 10:10 AM

## 2011-12-31 NOTE — Progress Notes (Addendum)
Direct Thrombin Inhibitor CONSULT NOTE - Follow Up Consult  Pharmacy Consult for - Bivalirudin  Indication: - R/O- Heparin Induced Thrombocytopenia    No Known Allergies  Patient Measurements: Height: 6' (182.9 cm) Weight: 168 lb 10.4 oz (76.5 kg) IBW/kg (Calculated) : 77.6   Vital Signs: Temp: 100.6 F (38.1 C) (04/28 0800) Temp src: Oral (04/28 0752) BP: 108/57 mmHg (04/28 0856) Pulse Rate: 91  (04/28 0856)  Labs:  Basename 12/31/11 0745 12/31/11 0709 12/31/11 0400 12/30/11 2000 12/30/11 0420 12/29/11 0400  HGB -- 10.2* -- -- 10.9* --  HCT -- 29.7* -- -- 31.1* 31.2*  PLT -- 85* -- -- 65* 63*  APTT 81* -- 80* 44* -- --  LABPROT -- -- -- -- -- --  INR -- -- -- -- -- --  HEPARINUNFRC -- -- -- -- -- --  CREATININE -- -- -- -- 0.95 0.94  CKTOTAL -- -- -- -- -- --  CKMB -- -- -- -- -- --  TROPONINI -- -- -- -- -- --   Estimated Creatinine Clearance: 105.1 ml/min (by C-G formula based on Cr of 0.95).  Medical History: Past Medical History  Diagnosis Date  . No significant past medical history    Medications:  Scheduled:    . antiseptic oral rinse  15 mL Mouth Rinse QID  . chlorhexidine  15 mL Mouth Rinse BID  . feeding supplement  30 mL Per Tube BID  . folic acid  1 mg Intravenous Daily  . free water  250 mL Per Tube Q4H  . furosemide  40 mg Intravenous Q8H  . insulin aspart  0-9 Units Subcutaneous Q4H  . mulitivitamin  5 mL Oral Daily  . pantoprazole sodium  40 mg Per Tube Q1200  . piperacillin-tazobactam (ZOSYN)  IV  3.375 g Intravenous Q8H  . potassium phosphate IVPB (mmol)  30 mmol Intravenous Once  . sodium chloride  3 mL Intravenous Q12H  . sodium chloride  3 mL Intravenous Q12H  . thiamine  100 mg Intravenous Daily   Assessment: Patient started on SQ Heparin 4/22 for DVT prophylaxis and has  received  doses every eight hours for the last 5 days.  Platelets on admission 4/21 = 155K and summary of drop as follows:  4/21 - Initially 155K 4/22 - Start  Heparin 5K q8 (1st dose not given until after AM labs) Plt. = 142K 4/23 - 120K 4/24 - 113K 4/25 - 80K 4/26 - 63K 4/27 - Today = 65K  He presented with leukopenia as well, which was thought to be possibly related to cocaine use.  His LFT's were elevated with possible hepatitis or liver dysfunction.  HIT panel ordered yesterday and is in progress.  Platelets better today at 85K << 65K.  No noted bleeding complications with Bivalirudin.  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  Will continue IV Bivalirudin at 0.19 mg/kg/hr 3.  Check aPTT daily. 4.  Monitor for bleeding complications 5.  F/u HIT panel and discontinue therapy if patient has negative HIT panel.  Nadara Mustard, PharmD., MS Clinical Pharmacist Pager:  [phone] Thank you for allowing pharmacy to be part of this patients care team. 12/31/2011,9:49 AM

## 2011-12-31 NOTE — Progress Notes (Signed)
ANTICOAGULATION CONSULT NOTE - Follow Up Consult  Pharmacy Consult for Angiomax Indication: r/o HIT  Labs:  Basename 12/31/11 0400 12/30/11 2000 12/30/11 1500 12/30/11 0420 12/29/11 0400 12/28/11 0545  HGB -- -- -- 10.9* 10.9* --  HCT -- -- -- 31.1* 31.2* 31.2*  PLT -- -- -- 65* 63* 80*  APTT 80* 44* 69* -- -- --  LABPROT -- -- -- -- -- --  INR -- -- -- -- -- --  HEPARINUNFRC -- -- -- -- -- --  CREATININE -- -- -- 0.95 0.94 0.97  CKTOTAL -- -- -- -- -- --  CKMB -- -- -- -- -- --  TROPONINI -- -- -- -- -- --    Assessment/Plan: 46yo male now therapeutic on Angiomax after rate increase.  Will maintain current rate and confirm stable with aPTT in 4hr.   Colleen Can PharmD BCPS 12/31/2011,5:37 AM

## 2012-01-01 ENCOUNTER — Inpatient Hospital Stay (HOSPITAL_COMMUNITY): Payer: Medicaid Other

## 2012-01-01 LAB — CULTURE, RESPIRATORY W GRAM STAIN

## 2012-01-01 LAB — BASIC METABOLIC PANEL
CO2: 31 mEq/L (ref 19–32)
Calcium: 8.7 mg/dL (ref 8.4–10.5)
Creatinine, Ser: 0.93 mg/dL (ref 0.50–1.35)
GFR calc non Af Amer: >90 mL/min (ref >90)
Glucose, Bld: 98 mg/dL (ref 70–99)

## 2012-01-01 LAB — CBC
Hemoglobin: 10.8 g/dL — ABNORMAL LOW (ref 13.0–17.0)
MCH: 32.6 pg (ref 26.0–34.0)
MCH: 32.5 pg (ref 26.0–34.0)
MCHC: 35 g/dL (ref 30.0–36.0)
MCV: 93.4 fL (ref 78.0–100.0)
MCV: 95.5 fL (ref 78.0–100.0)
Platelets: 150 10*3/uL (ref 150–400)
Platelets: 143 10*3/uL — ABNORMAL LOW (ref 150–400)
RBC: 3.31 MIL/uL — ABNORMAL LOW (ref 4.22–5.81)
RDW: 14.6 % (ref 11.5–15.5)

## 2012-01-01 LAB — GLUCOSE, CAPILLARY: Glucose-Capillary: 103 mg/dL — ABNORMAL HIGH (ref 70–99)

## 2012-01-01 LAB — MAGNESIUM: Magnesium: 1.9 mg/dL (ref 1.5–2.5)

## 2012-01-01 MED ORDER — VITAMIN B-1 100 MG PO TABS
100.0000 mg | ORAL_TABLET | Freq: Every day | ORAL | Status: DC
  Administered 2012-01-02 – 2012-01-10 (×9): 100 mg via ORAL
  Filled 2012-01-01 (×9): qty 1

## 2012-01-01 MED ORDER — FUROSEMIDE 10 MG/ML IJ SOLN
40.0000 mg | Freq: Once | INTRAMUSCULAR | Status: AC
  Administered 2012-01-01: 40 mg via INTRAVENOUS
  Filled 2012-01-01: qty 4

## 2012-01-01 MED ORDER — MAGNESIUM SULFATE 40 MG/ML IJ SOLN
2.0000 g | Freq: Once | INTRAMUSCULAR | Status: AC
  Administered 2012-01-01: 2 g via INTRAVENOUS
  Filled 2012-01-01: qty 50

## 2012-01-01 MED ORDER — QUETIAPINE FUMARATE 25 MG PO TABS
25.0000 mg | ORAL_TABLET | Freq: Two times a day (BID) | ORAL | Status: DC
  Administered 2012-01-01 – 2012-01-10 (×19): 25 mg via ORAL
  Filled 2012-01-01 (×22): qty 1

## 2012-01-01 MED ORDER — PRO-STAT SUGAR FREE PO LIQD
30.0000 mL | Freq: Four times a day (QID) | ORAL | Status: DC
  Administered 2012-01-01 – 2012-01-06 (×16): 30 mL
  Filled 2012-01-01 (×28): qty 30

## 2012-01-01 MED ORDER — FOLIC ACID 1 MG PO TABS
1.0000 mg | ORAL_TABLET | Freq: Every day | ORAL | Status: DC
  Administered 2012-01-02 – 2012-01-11 (×10): 1 mg via ORAL
  Filled 2012-01-01 (×10): qty 1

## 2012-01-01 MED ORDER — OXEPA PO LIQD
1000.0000 mL | ORAL | Status: DC
  Administered 2012-01-01 – 2012-01-03 (×3): 1000 mL
  Filled 2012-01-01 (×8): qty 1000

## 2012-01-01 MED ORDER — FUROSEMIDE 10 MG/ML IJ SOLN: 40.0000 mg | Freq: Three times a day (TID) | INTRAMUSCULAR | Status: DC

## 2012-01-01 MED ORDER — SODIUM CHLORIDE 0.9 % IV SOLN
500.0000 mg | Freq: Four times a day (QID) | INTRAVENOUS | Status: AC
  Administered 2012-01-01 – 2012-01-10 (×32): 500 mg via INTRAVENOUS
  Filled 2012-01-01 (×39): qty 500

## 2012-01-01 NOTE — Progress Notes (Signed)
Portable EEG performed.

## 2012-01-01 NOTE — Progress Notes (Signed)
PROGRESS NOTE  Subjective:   Patient is a 46 y.o. male with a PMHx of drug abuse, who was admitted to [facility] on 12/24/2011 for evaluation of cardiac arrest.  The patient was apparently in a motel room doing drugs. His friend found him to be unconscious. EMS was called and they found him to be in pulseless electrical activity arrest. ACLS was started and the patient was resuscitated. The patient was brought to the closest emergency room and was admitted by the pulmonary critical care team.  Hypothermic cooling was initiated in the field with the infusion of iced saline. He was placed on the [place] protocol and has now been re-warmed.  He became very aggitated when the sedation was stopped so he has be continuously sedated since then.  The patient is sedated and is on the ventilator.  Several attempts to allow him to wake up have failed due to aggitation.  His lung function is very poor ( aspiration pneumonia, long smoking hx)   Objective:    Vital Signs:   Temp:  [99.5 F (37.5 C)-100.2 F (37.9 C)] 99.9 F (37.7 C) (04/29 0316) Pulse Rate:  [86-98] 97  (04/29 0316) Resp:  [19-35] 20  (04/29 0316) BP: (106-139)/(39-72) 118/67 mmHg (04/29 0316) SpO2:  [94 %-100 %] 99 % (04/29 0316) FiO2 (%):  [39.8 %-40.3 %] 40 % (04/29 0316) Weight:  [156 lb 4.9 oz (70.9 kg)] 156 lb 4.9 oz (70.9 kg) (04/29 0500)  Last BM Date: 12/31/11   24-hour weight change: Weight change: -12 lb 5.5 oz (-5.6 kg)  Weight trends: Filed Weights   12/30/11 0400 12/31/11 0500 01/01/12 0500  Weight: 162 lb 0.6 oz (73.5 kg) 168 lb 10.4 oz (76.5 kg) 156 lb 4.9 oz (70.9 kg)    Intake/Output:  04/28 0701 - 04/29 0700 In: 5126.9 [I.V.:1954.9; NG/GT:2460; IV Piggyback:712] Out: 7200 [Urine:7200]     Physical Exam: BP 118/67  Pulse 97  Temp(Src) 99.9 F (37.7 C) (Core (Comment))  Resp 20  Ht 6' (1.829 m)  Wt 156 lb 4.9 oz (70.9 kg)  BMI 21.20 kg/m2  SpO2 99%  General: Vital signs reviewed and noted.  sedated.  Head: Normocephalic, atraumatic.  Eyes: Eyes closed  Throat: Oropharynx nonerythematous, no exudate appreciated.   Neck: Supple. Normal carotids. No JVD  Lungs:  On the vent  Heart: Regular rate,  With normal  S1 S2. No murmurs, rubs, or gallops   Abdomen:  Soft, non-tender, non-distended with normoactive bowel sounds. No hepatomegaly. No rebound/guarding. No abdominal masses.  Extremities: No edema.  Distal pedal pulses are 2+ and equal bilaterally.  Neurologic: Sedated, agitated when off sedation  Psych: sedated    Labs: BMET:  Basename 01/01/12 0200 12/31/11 1226 12/30/11 0420  NA 140 141 --  K 4.5 4.4 --  CL 102 103 --  CO2 31 30 --  GLUCOSE 98 85 --  BUN 38* 37* --  CREATININE 0.93 0.89 --  CALCIUM 8.7 8.6 --  MG 1.9 -- 1.7  PHOS 4.7* -- 4.4    Liver function tests: No results found for this basename: AST:2,ALT:2,ALKPHOS:2,BILITOT:2,PROT:2,ALBUMIN:2 in the last 72 hours No results found for this basename: LIPASE:2,AMYLASE:2 in the last 72 hours  CBC:  Basename 01/01/12 0200 12/31/11 0709  WBC 14.8* 15.4*  NEUTROABS -- --  HGB 10.8* 10.2*  HCT 30.9* 29.7*  MCV 93.4 94.0  PLT 150 85*    Cardiac Enzymes: No results found for this basename: CKTOTAL:4,CKMB:4,TROPONINI:4 in the last 72 hours  Coagulation Studies: No results found for this basename: LABPROT:5,INR:5 in the last 72 hours  Other:    Tele:  NSR  Medications:    Infusions:    . sodium chloride 500 mL (01/01/12 0052)  . bivalirudin (ANGIOMAX) infusion 0.5 mg/mL (Non-ACS indications) 0.18 mg/kg/hr (12/31/11 1730)  . feeding supplement (OXEPA) 1,000 mL (12/31/11 1603)  . fentaNYL infusion INTRAVENOUS 300 mcg/hr (01/01/12 0021)  . midazolam (VERSED) infusion 6 mg/hr (12/31/11 1102)  . DISCONTD: bivalirudin (ANGIOMAX) infusion 0.5 mg/mL (Non-ACS indications) 0.19 mg/kg/hr (12/31/11 1102)  . DISCONTD: bivalirudin (ANGIOMAX) infusion 0.5 mg/mL (Non-ACS indications) 0.18 mg/kg/hr  (12/31/11 1746)    Scheduled Medications:    . antiseptic oral rinse  15 mL Mouth Rinse QID  . chlorhexidine  15 mL Mouth Rinse BID  . enalaprilat  0.625 mg Intravenous Q6H  . feeding supplement  30 mL Per Tube BID  . folic acid  1 mg Intravenous Daily  . free water  250 mL Per Tube Q4H  . furosemide  40 mg Intravenous Q8H  . haloperidol lactate  5 mg Intravenous Q6H  . imipenem-cilastatin  500 mg Intravenous Q8H  . insulin aspart  0-9 Units Subcutaneous Q4H  . mulitivitamin  5 mL Oral Daily  . pantoprazole sodium  40 mg Per Tube Q1200  . potassium chloride  40 mEq Per Tube BID  . sodium chloride  3 mL Intravenous Q12H  . sodium chloride  3 mL Intravenous Q12H  . thiamine  100 mg Intravenous Daily  . vancomycin  1,250 mg Intravenous Q12H  . DISCONTD: piperacillin-tazobactam (ZOSYN)  IV  3.375 g Intravenous Q8H  . DISCONTD: potassium chloride  40 mEq Per Tube BID    Assessment/ Plan:    1. Cardiac: he has CHF with an EF of 25%.  He currently is sedated and is on the vent and has an abnormal EEG.   He is off pressors Will consider further cardiac evaluation once he is off the vent and is not requiring sedation.  No further recs at this point.   He is critically ill from a pulmonary/ sepsis standpoint.  His prognosis is poor.   Length of Stay: 8  Vesta Mixer, Montez Hageman., MD, [facility] 01/01/2012, 8:15 AM

## 2012-01-01 NOTE — Progress Notes (Signed)
[facility]PITAL[facility] PROGRESS NOTE    SUBJECTIVE   No complaints.    OBJECTIVE   Vital signs in last 24 hours: Temp:  [99.5 F (37.5 C)-100.2 F (37.9 C)] 99.9 F (37.7 C) (04/29 0316) Pulse Rate:  [86-98] 91  (04/29 0827) Resp:  [19-35] 20  (04/29 0316) BP: (103-139)/(39-72) 103/54 mmHg (04/29 0827) SpO2:  [94 %-100 %] 97 % (04/29 0827) FiO2 (%):  [39.8 %-40.3 %] 40 % (04/29 0827) Weight:  [70.9 kg (156 lb 4.9 oz)] 70.9 kg (156 lb 4.9 oz) (04/29 0500)  Intake/Output from previous day: 04/28 0701 - 04/29 0700 In: 5126.9 [I.V.:1954.9; NG/GT:2460; IV Piggyback:712] Out: 7200 [Urine:7200] Intake/Output this shift:   Nutritional status: NPO  Past Medical History  Diagnosis Date  . No significant past medical history     Neurologic Exam:   Mental Status: Intubated on Fentanyl and Versed.  Able to localize to pain and follow one command (sqeeze my hand) Cranial Nerves: II-Blinks to threat III/IV/VI-positive doll's  Pupils reactive bilaterally. V/VII-Smile symmetric when grimmices VIII-grossly intact grimaces to pain IX/X-normal gag XI-bilateral shoulder shrug  Motor: Able to squeeze my hand with minimal strength when commanded to and releases when asked. Purposefully pulls left arm up and localizes to pain when sternal rubbed. Slightly bends right arm when sternal rub given. Bilateral legs are held in extension when pain is given.  Sensory: Intact to pain bilaterally in all 4 extremities Deep Tendon Reflexes: 3+ and symmetric throughout Plantars: mute bilaterally .  Lab Results: Results for orders placed during the hospital encounter of 12/24/11 (from the past 24 hour(s))  GLUCOSE, CAPILLARY     Status: Abnormal   Collection Time   12/31/11 11:52 AM      Component Value Range   Glucose-Capillary 105 (*) 70 - 99 (mg/dL)  BASIC METABOLIC PANEL     Status: Abnormal   Collection Time   12/31/11 12:26 PM      Component Value  Range   Sodium 141  135 - 145 (mEq/L)   Potassium 4.4  3.5 - 5.1 (mEq/L)   Chloride 103  96 - 112 (mEq/L)   CO2 30  19 - 32 (mEq/L)   Glucose, Bld 85  70 - 99 (mg/dL)   BUN 37 (*) 6 - 23 (mg/dL)   Creatinine, Ser 1.61  0.50 - 1.35 (mg/dL)   Calcium 8.6  8.4 - 09.6 (mg/dL)   GFR calc non Af Amer >90  >90 (mL/min)   GFR calc Af Amer >90  >90 (mL/min)  GLUCOSE, CAPILLARY     Status: Abnormal   Collection Time   12/31/11  2:56 PM      Component Value Range   Glucose-Capillary 113 (*) 70 - 99 (mg/dL)   Comment 1 Notify RN    GLUCOSE, CAPILLARY     Status: Abnormal   Collection Time   12/31/11  8:57 PM      Component Value Range   Glucose-Capillary 114 (*) 70 - 99 (mg/dL)  APTT     Status: Abnormal   Collection Time   12/31/11  9:00 PM      Component Value Range   aPTT 75 (*) 24 - 37 (seconds)  GLUCOSE, CAPILLARY     Status: Normal   Collection Time  12/31/11 11:55 PM      Component Value Range   Glucose-Capillary 96  70 - 99 (mg/dL)  MAGNESIUM     Status: Normal   Collection Time   01/01/12  2:00 AM      Component Value Range   Magnesium 1.9  1.5 - 2.5 (mg/dL)  PHOSPHORUS     Status: Abnormal   Collection Time   01/01/12  2:00 AM      Component Value Range   Phosphorus 4.7 (*) 2.3 - 4.6 (mg/dL)  CBC     Status: Abnormal   Collection Time   01/01/12  2:00 AM      Component Value Range   WBC 14.8 (*) 4.0 - 10.5 (K/uL)   RBC 3.31 (*) 4.22 - 5.81 (MIL/uL)   Hemoglobin 10.8 (*) 13.0 - 17.0 (g/dL)   HCT 29.5 (*) 62.1 - 52.0 (%)   MCV 93.4  78.0 - 100.0 (fL)   MCH 32.6  26.0 - 34.0 (pg)   MCHC 35.0  30.0 - 36.0 (g/dL)   RDW 30.8  65.7 - 84.6 (%)   Platelets 150  150 - 400 (K/uL)  BASIC METABOLIC PANEL     Status: Abnormal   Collection Time   01/01/12  2:00 AM      Component Value Range   Sodium 140  135 - 145 (mEq/L)   Potassium 4.5  3.5 - 5.1 (mEq/L)   Chloride 102  96 - 112 (mEq/L)   CO2 31  19 - 32 (mEq/L)   Glucose, Bld 98  70 - 99 (mg/dL)   BUN 38 (*) 6 - 23 (mg/dL)     Creatinine, Ser 9.62  0.50 - 1.35 (mg/dL)   Calcium 8.7  8.4 - 95.2 (mg/dL)   GFR calc non Af Amer >90  >90 (mL/min)   GFR calc Af Amer >90  >90 (mL/min)  APTT     Status: Abnormal   Collection Time   01/01/12  5:19 AM      Component Value Range   aPTT 82 (*) 24 - 37 (seconds)  GLUCOSE, CAPILLARY     Status: Abnormal   Collection Time   01/01/12  8:06 AM      Component Value Range   Glucose-Capillary 111 (*) 70 - 99 (mg/dL)   Lipid Panel No results found for this basename: CHOL,TRIG,HDL,CHOLHDL,VLDL,LDLCALC in the last 72 hours  Studies/Results: Dg Chest Port 1 View  01/01/2012  *RADIOLOGY REPORT*  Clinical Data: Pneumonia.  Check endotracheal tube position.  PORTABLE CHEST - 1 VIEW  Comparison: Portable chest 12/31/2011.  Findings: The endotracheal tube terminates at the level of the clavicles, well above the carina.  A right IJ line is stable in the distal SVC.  The NG tube courses off the inferior border of the film.  The heart size is normal.  A right upper lobe pneumonia persists. Moderate pulmonary vascular congestion is noted bilaterally. Generalized density in the right lung likely represents asymmetric edema.  IMPRESSION:  1.  Persistent right upper lobe pneumonia. 2.  The support apparatus is stable and in satisfactory position. 3.  Stable moderate pulmonary vascular congestion.  Original Report Authenticated By: Jamesetta Orleans. MATTERN, M.D.   Dg Chest Port 1 View  12/31/2011  *RADIOLOGY REPORT*  Clinical Data: Acute respiratory failure.  PORTABLE CHEST - 1 VIEW  Comparison: Yesterday  Findings: Endotracheal tube in satisfactory position.  Right jugular catheter tip in the superior vena cava.  Nasogastric tube extending into the stomach.  Normal  sized heart.  Decreased airspace opacity in the right upper lung zone and left apical region.  Interval scattered airspace opacities elsewhere in both lungs.  Unremarkable bones.  IMPRESSION:  1.  Improving bilateral upper lobe pneumonia. 2.   Interval scattered airspace opacities in both lungs.  These could represent areas of alveolar edema or developing pneumonia.  Original Report Authenticated By: Darrol Angel, M.D.    Medications:     Scheduled:   . antiseptic oral rinse  15 mL Mouth Rinse QID  . chlorhexidine  15 mL Mouth Rinse BID  . enalaprilat  0.625 mg Intravenous Q6H  . feeding supplement  30 mL Per Tube BID  . folic acid  1 mg Intravenous Daily  . free water  250 mL Per Tube Q4H  . furosemide  40 mg Intravenous Once  . haloperidol lactate  5 mg Intravenous Q6H  . imipenem-cilastatin  500 mg Intravenous Q6H  . insulin aspart  0-9 Units Subcutaneous Q4H  . magnesium sulfate 1 - 4 g bolus IVPB  2 g Intravenous Once  . mulitivitamin  5 mL Oral Daily  . pantoprazole sodium  40 mg Per Tube Q1200  . potassium chloride  40 mEq Per Tube BID  . QUEtiapine  25 mg Oral BID  . sodium chloride  3 mL Intravenous Q12H  . sodium chloride  3 mL Intravenous Q12H  . thiamine  100 mg Intravenous Daily  . vancomycin  1,250 mg Intravenous Q12H  . DISCONTD: furosemide  40 mg Intravenous Q8H  . DISCONTD: furosemide  40 mg Intravenous Q8H  . DISCONTD: imipenem-cilastatin  500 mg Intravenous Q8H  . DISCONTD: potassium chloride  40 mEq Per Tube BID    Assessment/Plan:   46 YO male with questionable anoxic brain injury who continues to be sedated due to agitation. Initial EEG showed slowing on 12/27/11 and CT head 12/24/11 showed no acute infarct.  Patient remains  Intubated and sedated, follows minimal commands.  Cranial nerves intact.  Patient clearly not brain dead however accurate neurological assessment is limited due to sedation.  Patient is far enough out that neurological deficits likely permanent. PCCM has had discussion about withdrawing versus PEG and Trach with family.   Will obtain  EEG and CT head, although given exam will not change outcome in patient outcome.     Felicie Morn PA-C [facility]  Neurohospitalist [phone]  01/01/2012, 10:30 AM

## 2012-01-01 NOTE — Progress Notes (Signed)
Name: Angel [name] DOB: 1966-02-09 MRN: [id number] PCP: No primary provider on file. ADMIT DATE: 12/24/2011 LOS: 8  PCCM PROGRESS NOTE  Brief Patient Profile:46 YO man with no past medical history. His family states that he abuses some perscription drugs and some occasional alcohol. Smokes 2 PPD history. Daughter tried to call him all day but he was sleeping which is unusual for him. His friend found him shaking violently around 47 PM and called 911 who intubated in the field for agonal breathing. He was unresponsive at the time. He was brought to the ED and placed on hypothermia protocol. No ischemic EKG changes on ED arrival. UDS positive for cocaine and THC.    Best Practice: DVT: SQ Heparin >> 4/27 (due to low plat), ScD 4/7 and Argotraban (mod prob HIT) 4/27 > SUP: protonix Nutrition: Tube feed Glycemic control:  Sedation/analgesia: Cont sedation (off precedex 4/26)  Lines / Drains: ETT 4/21>>>  CVC 4/21>>>   Micro: Blood 4/21>>>HAEMOPHILUS INFLUENZAE Urine 4/21>>>Neg  Sputum 4/21>>>GNR and GPC - NORMAL FLORA ....................... [facility] 4/27 BAL 4/27  Urine 4/27    Lab 12/30/11 1002  LATICACIDVEN 0.9   Recent Results (from the past 240 hour(s))  CULTURE, BLOOD (ROUTINE X 2)     Status: Normal   Collection Time   12/24/11  8:11 PM      Component Value Range Status Comment   Specimen Description BLOOD RIGHT ARM   Final    Special Requests BOTTLES DRAWN AEROBIC ONLY 5CC   Final    Culture  Setup Time [id number]   Final    Culture NO GROWTH 5 DAYS   Final    Report Status 12/31/2011 FINAL   Final   URINE CULTURE     Status: Normal   Collection Time   12/24/11  8:28 PM      Component Value Range Status Comment   Specimen Description URINE, CATHETERIZED   Final    Special Requests NONE   Final    Culture  Setup Time [id number]   Final    Colony Count NO GROWTH   Final    Culture NO GROWTH   Final    Report Status 12/26/2011 FINAL   Final   CULTURE, BLOOD  (ROUTINE X 2)     Status: Normal (Preliminary result)   Collection Time   12/24/11  8:48 PM      Component Value Range Status Comment   Specimen Description BLOOD LEFT HAND   Final    Special Requests BOTTLES DRAWN AEROBIC AND ANAEROBIC 10CC EA   Final    Culture  Setup Time [id number]   Final    Culture     Final    Value: HAEMOPHILUS INFLUENZAE     Note: BETA LACTAMASE NEGATIVE CRITICAL RESULT CALLED TO, READ BACK BY AND VERIFIED WITH: KAMALA GOODE 1450 12/27/11 BY KRAWS     Note: Gram Stain Report Called to,Read Back By and Verified With: CATHERINE COMAN @0218  ON 12/26/11 BY MCLET   Report Status PENDING   Incomplete   MRSA PCR SCREENING     Status: Normal   Collection Time   12/24/11 10:01 PM      Component Value Range Status Comment   MRSA by PCR NEGATIVE  NEGATIVE  Final   CULTURE, RESPIRATORY     Status: Normal   Collection Time   12/24/11 11:13 PM      Component Value Range Status Comment   Specimen Description TRACHEAL ASPIRATE   Final  Special Requests NONE   Final    Gram Stain     Final    Value: FEW WBC PRESENT,BOTH PMN AND MONONUCLEAR     NO SQUAMOUS EPITHELIAL CELLS SEEN     FEW GRAM POSITIVE COCCI IN PAIRS     MODERATE GRAM NEGATIVE RODS   Culture Non-Pathogenic Oropharyngeal-type Flora Isolated.   Final    Report Status 12/26/2011 FINAL   Final   CULTURE, RESPIRATORY     Status: Normal (Preliminary result)   Collection Time   12/29/11 11:26 AM      Component Value Range Status Comment   Specimen Description OTHER   Final    Special Requests ETT SUCTION   Final    Gram Stain     Final    Value: FEW WBC PRESENT,BOTH PMN AND MONONUCLEAR     NO SQUAMOUS EPITHELIAL CELLS SEEN     NO ORGANISMS SEEN   Culture Culture reincubated for better growth   Final    Report Status PENDING   Incomplete   CULTURE, BLOOD (ROUTINE X 2)     Status: Normal (Preliminary result)   Collection Time   12/30/11 11:08 AM      Component Value Range Status Comment   Specimen Description  BLOOD ARM RIGHT   Final    Special Requests BOTTLES DRAWN AEROBIC AND ANAEROBIC 10CC EACH   Final    Culture  Setup Time [id number]   Final    Culture     Final    Value:        BLOOD CULTURE RECEIVED NO GROWTH TO DATE CULTURE WILL BE HELD FOR 5 DAYS BEFORE ISSUING A FINAL NEGATIVE REPORT   Report Status PENDING   Incomplete   CULTURE, BLOOD (ROUTINE X 2)     Status: Normal (Preliminary result)   Collection Time   12/30/11 11:20 AM      Component Value Range Status Comment   Specimen Description BLOOD ARM RIGHT   Final    Special Requests BOTTLES DRAWN AEROBIC ONLY 10CC    Final    Culture  Setup Time [id number]   Final    Culture     Final    Value:        BLOOD CULTURE RECEIVED NO GROWTH TO DATE CULTURE WILL BE HELD FOR 5 DAYS BEFORE ISSUING A FINAL NEGATIVE REPORT   Report Status PENDING   Incomplete   URINE CULTURE     Status: Normal   Collection Time   12/30/11 11:22 AM      Component Value Range Status Comment   Specimen Description URINE, CATHETERIZED   Final    Special Requests NONE   Final    Culture  Setup Time [id number]   Final    Colony Count NO GROWTH   Final    Culture NO GROWTH   Final    Report Status 12/31/2011 FINAL   Final   CULTURE, BAL-QUANTITATIVE     Status: Normal (Preliminary result)   Collection Time   12/30/11 12:51 PM      Component Value Range Status Comment   Specimen Description BRONCHIAL ALVEOLAR LAVAGE   Final    Special Requests NONE   Final    Gram Stain PENDING   Incomplete    Colony Count PENDING   Incomplete    Culture NO GROWTH   Final    Report Status PENDING   Incomplete     Antibiotics: Anti-infectives     Start  Dose/Rate Route Frequency Ordered Stop   01/01/12 1200   imipenem-cilastatin (PRIMAXIN) 500 mg in sodium chloride 0.9 % 100 mL IVPB        500 mg 200 mL/hr over 30 Minutes Intravenous 4 times per day 01/01/12 0943     12/31/11 1400   imipenem-cilastatin (PRIMAXIN) 500 mg in sodium chloride 0.9 % 100 mL IVPB   Status:  Discontinued        500 mg 200 mL/hr over 30 Minutes Intravenous 3 times per day 12/31/11 1121 01/01/12 0943   12/31/11 1300   vancomycin (VANCOCIN) 1,250 mg in sodium chloride 0.9 % 250 mL IVPB        1,250 mg 166.7 mL/hr over 90 Minutes Intravenous Every 12 hours 12/31/11 1120     12/25/11 0600   piperacillin-tazobactam (ZOSYN) IVPB 3.375 g  Status:  Discontinued        3.375 g 12.5 mL/hr over 240 Minutes Intravenous 3 times per day 12/24/11 2257 12/31/11 1028   12/24/11 2359   vancomycin (VANCOCIN) IVPB 1000 mg/200 mL premix  Status:  Discontinued        1,000 mg 200 mL/hr over 60 Minutes Intravenous Daily at bedtime 12/24/11 2301 12/29/11 1009   12/24/11 2045  piperacillin-tazobactam (ZOSYN) 3.375 g in dextrose 5 % 50 mL IVPB       3.375 g 100 mL/hr over 30 Minutes Intravenous To [facility] 12/24/11 2011 12/24/11 2206   12/24/11 2015   vancomycin (VANCOCIN) IVPB 1000 mg/200 mL premix  Status:  Discontinued        1,000 mg 200 mL/hr over 60 Minutes Intravenous  Once 12/24/11 2011 12/24/11 2301          Tests / Events:  4/27 - HIT panel 4/28: agitated on wUA but followed commands.    Consults:   Subjective/Overnight/Interval History:  Extremely agitated on WUA. Moves all 4s. Intense Cough + Gag +. Corneals +. Per RN: followed commands x today to neurologist  Per family on 4/27: localized, had tear in eye at mention of youngest daughter  Copious pink secretions from ET tube +  Vital Signs: Temp:  [99.5 F (37.5 C)-100.2 F (37.9 C)] 99.9 F (37.7 C) (04/29 0316) Pulse Rate:  [86-98] 91  (04/29 0827) Resp:  [19-35] 20  (04/29 0316) BP: (103-139)/(39-72) 103/54 mmHg (04/29 0827) SpO2:  [94 %-100 %] 97 % (04/29 0827) FiO2 (%):  [39.8 %-40.3 %] 40 % (04/29 0827) Weight:  [70.9 kg (156 lb 4.9 oz)] 70.9 kg (156 lb 4.9 oz) (04/29 0500) I/O last 3 completed shifts: In: 7027.8 [I.V.:3075.8; NG/GT:3240; IV Piggyback:712] Out: 8400  [Urine:8400]  Intake/Output Summary (Last 24 hours) at 01/01/12 0956 Last data filed at 01/01/12 0600  Gross per 24 hour  Intake 5126.87 ml  Output   7200 ml  Net -2073.13 ml   Physical Examination: General: Critically ill looking male. On ventilator  Neuro: Extremely agitated on WUA. Moving all 4s. Cough +. Corneals +, REports that he followed commands +   HEENT:  corneals +. No jaundice Neck:    Intubated Cardiovascular:  Normal heart sounds.+ Chest: Cleart to ausculatation Lungs:  Normal chest Abdomen:  Soft, non tennder Musculoskeletal:   Normal joints Skin:  intact Extremities:No edema, no cyanosis, no clubbing.  Ventilator settings: Vent Mode:  [-] PRVC FiO2 (%):  [39.8 %-40.3 %] 40 % Set Rate:  [20 bmp] 20 bmp Vt Set:  [409 mL] 620 mL PEEP:  [5 cmH20-5.6 cmH20] 5 cmH20 Plateau Pressure:  [  14 cmH20-23 cmH20] 23 cmH20  Labs/Radiology: Please see A and P for full labs  Dg Chest Port 1 View  01/01/2012  *RADIOLOGY REPORT*  Clinical Data: Pneumonia.  Check endotracheal tube position.  PORTABLE CHEST - 1 VIEW  Comparison: Portable chest 12/31/2011.  Findings: The endotracheal tube terminates at the level of the clavicles, well above the carina.  A right IJ line is stable in the distal SVC.  The NG tube courses off the inferior border of the film.  The heart size is normal.  A right upper lobe pneumonia persists. Moderate pulmonary vascular congestion is noted bilaterally. Generalized density in the right lung likely represents asymmetric edema.  IMPRESSION:  1.  Persistent right upper lobe pneumonia. 2.  The support apparatus is stable and in satisfactory position. 3.  Stable moderate pulmonary vascular congestion.  Original Report Authenticated By: Jamesetta Orleans. MATTERN, M.D.   Dg Chest Port 1 View  12/31/2011  *RADIOLOGY REPORT*  Clinical Data: Acute respiratory failure.  PORTABLE CHEST - 1 VIEW  Comparison: Yesterday  Findings: Endotracheal tube in satisfactory position.   Right jugular catheter tip in the superior vena cava.  Nasogastric tube extending into the stomach.  Normal sized heart.  Decreased airspace opacity in the right upper lung zone and left apical region.  Interval scattered airspace opacities elsewhere in both lungs.  Unremarkable bones.  IMPRESSION:  1.  Improving bilateral upper lobe pneumonia. 2.  Interval scattered airspace opacities in both lungs.  These could represent areas of alveolar edema or developing pneumonia.  Original Report Authenticated By: Darrol Angel, M.D.    ASSESSMENT AND PLAN 46 year old male with PMH of cocaine, etoh and drug abuse presenting after being found down in a hotel room positive for cocaine and THC. Patient was noted to have vomitus into his airway upon intubation. RUL and RML infiltrate noted. Patient was started on Abx and hypothermia as well ARDS protocol were started.  RESPIRATORY  Lab 12/30/11 0335 12/29/11 0349 12/28/11 1130 12/28/11 0942 12/28/11 0348  PHART 7.418 7.412 7.375 7.415 7.491*  PCO2ART 51.4* 41.9 40.2 34.2* 31.4*  PO2ART 91.0 104.0* 77.7* 121.0* 113.0*  HCO3 32.9* 26.6* 23.0 22.0 24.1*  O2SAT 97.0 98.0 94.4 99.0 99.0  A:  Acute Respiratory Failure due to cardiac arrest and pneumonia and suspect chronic lung damage due to drug abuse and smoking. Pland: Full vent support  Titrate O2 down as tolerated  PS trials  No extubation with mental status changes  Bronchodilators  Diures  CARDIAC Lab 12/30/11 1100 12/26/11 0758 12/25/11 1342  TROPONINI -- 12.01* 7.31*  PROBNP 2799.0* -- --  A: S/P cardiac arrest on 12/24/2011.  S/p arctic sun. ECHO 4/22: Diffuse cardiomyopathy and systolic CHF.  BNP high on 4/27 and red secretions from ET tube likely acute pulmonary edema P:  No interventions until extubated and mental status is better.  Lasix as ordered.  Other recommendations per cardiology.  NEUROLOGIC A:  S/p cardiac arrest. Due to coccaine and pneumonia.  Extreme  agitation/delirium/encephalopathy.  Failed precedex 4/26.  This is likely a combination of anoxic injury as well as drug withdrawal.  EEG is noted. P:  Continue sedation drips.  Haldol PRN.  Appreciate input from neurology.  VASCULAR/HEMODYNAMIC A:  Shock due to sepsis/pneumonia and cardiac arrest, maintain off pressors as tolerated. P: Monitor, MAP goal > 65  INFECTIOUS DISEASE Lab 01/01/12 0200 12/31/11 0709 12/30/11 1002 12/30/11 0420 12/29/11 0400 12/28/11 0545  WBC 14.8* 15.4* -- 20.7* 23.7* 23.0*  PROCALCITON -- -- 6.51 -- -- --  LATICACIDVEN -- -- 0.9 -- -- --  A:  Hemophilus influenzae and aspiration pneumonia on 12/24/2011 date of admit P: Change zosyn to vanc and imipenem on 12/31/2011.  RENAL Lab 01/01/12 0200 12/31/11 1226 12/30/11 0420 12/29/11 0400 12/28/11 0545  BUN 38* 37* 34* 36* 40*  CREATININE 0.93 0.89 0.95 0.94 0.97  A:  Normal renal function  P:  Additional lasix and K replacement  F/U BMET  ELECTROLYTES Lab 01/01/12 0200 12/31/11 1226 12/30/11 0420 12/29/11 0400 12/28/11 0545 12/27/11 0350  NA 140 141 142 145 147* --  K 4.5 4.4 -- -- -- --  CL 102 103 102 110 112 --  CO2 31 30 31 26 24  --  BUN 38* 37* 34* 36* 40* --  CREATININE 0.93 0.89 0.95 0.94 0.97 --  CALCIUM 8.7 8.6 8.2* 8.0* 8.0* --  MG 1.9 -- 1.7 2.1 2.3 1.5  PHOS 4.7* -- 4.4 2.8 1.3* 3.2  A: Hypomagnesemia Mg of 1.9, will give additional dose today and rechek P: recheck labs 4/28  HEMATOLOGIC  Lab 01/01/12 0519 01/01/12 0200 12/31/11 2100 12/31/11 0745 12/31/11 0709 12/31/11 0400 12/30/11 2000 12/30/11 0420 12/29/11 0400 12/28/11 0545  HGB -- 10.8* -- -- 10.2* -- -- 10.9* 10.9* 11.1*  HCT -- 30.9* -- -- 29.7* -- -- 31.1* 31.2* 31.2*  PLT -- 150 -- -- 85* -- -- 65* 63* 80*  INR -- -- -- -- -- -- -- -- -- --  APTT 82* -- 75* 81* -- 80* 44* -- -- --  A:   Anemia of critical illness.  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  Rx  angiomax since 4/27. Duplex LE negative 4/27.  Upper ext dopplers with bilateral cephalic vein thrombosis. P: - PRBC for hgb < 7gm%.  - Agree with hold heparin since 4/26 and use scd's.  - Continune angiomax since 4/27 - stop when HIT returns negative but will need anticoagulation due to cephalic vein thrombosis.  GASTROINTESTINAL  Lab 12/26/11 0347  AST 308*  ALT 363*  ALKPHOS 55  BILITOT 0.4  PROT 5.1*  ALBUMIN 2.4*  INR --  AMYLASE --  LIPASE --  A: Shock liver versus chronic etoh abuse. P: Recheck LFT's.  ENDOCRINE  Lab 01/01/12 0806 12/31/11 2355 12/31/11 2057 12/31/11 1456 12/31/11 1152  GLUCAP 111* 96 114* 113* 105*  TSH -- -- -- -- --  CORTISOL -- -- -- -- --  A:  None P: sliding scale insulin  DERMATOLOGY A: Stage 1 on both big toes and heels. And Stage 2 on sacrum with black center - present since admit per RN P:  Wound care consult called 4/28. Continue air mattress overlay   Family updated bedside.  CC time 45 minutes.  Shearon Clonch 01/01/2012, 9:48 AM

## 2012-01-01 NOTE — Progress Notes (Signed)
Orthopedic Tech Progress Note Patient Details:  Angel [name] 1966-05-04 [id number]  Other Ortho Devices Ortho Device Location: prafo x 2 Ortho Device Interventions: Application   Cammer, [name] 01/01/2012, 2:30 PM

## 2012-01-01 NOTE — Progress Notes (Signed)
Nutrition Follow-up  Diet Order:  NPO  Remains intubated and sedated. MD notes pt agitated with out sedation. Pt failed SBT's several times. No plans to extubate with mental status changes. Pt remains on Oxepa at 60 ml/hr and Pro-stat BID. This is providing 2360 kcal and 120 gm protein. Also free water, 250 ml q 4 hours providing a total of 2630 ml water (flush and TF).   Meds: Scheduled Meds:   . antiseptic oral rinse  15 mL Mouth Rinse QID  . chlorhexidine  15 mL Mouth Rinse BID  . enalaprilat  0.625 mg Intravenous Q6H  . feeding supplement  30 mL Per Tube BID  . folic acid  1 mg Intravenous Daily  . free water  250 mL Per Tube Q4H  . furosemide  40 mg Intravenous Once  . haloperidol lactate  5 mg Intravenous Q6H  . imipenem-cilastatin  500 mg Intravenous Q6H  . insulin aspart  0-9 Units Subcutaneous Q4H  . magnesium sulfate 1 - 4 g bolus IVPB  2 g Intravenous Once  . mulitivitamin  5 mL Oral Daily  . pantoprazole sodium  40 mg Per Tube Q1200  . potassium chloride  40 mEq Per Tube BID  . QUEtiapine  25 mg Oral BID  . sodium chloride  3 mL Intravenous Q12H  . sodium chloride  3 mL Intravenous Q12H  . thiamine  100 mg Intravenous Daily  . vancomycin  1,250 mg Intravenous Q12H  . DISCONTD: furosemide  40 mg Intravenous Q8H  . DISCONTD: furosemide  40 mg Intravenous Q8H  . DISCONTD: imipenem-cilastatin  500 mg Intravenous Q8H  . DISCONTD: potassium chloride  40 mEq Per Tube BID   Continuous Infusions:   . sodium chloride 500 mL (01/01/12 0052)  . bivalirudin (ANGIOMAX) infusion 0.5 mg/mL (Non-ACS indications) 0.18 mg/kg/hr (12/31/11 1730)  . feeding supplement (OXEPA) 1,000 mL (12/31/11 1603)  . fentaNYL infusion INTRAVENOUS 300 mcg/hr (01/01/12 0924)  . midazolam (VERSED) infusion 6 mg/hr (01/01/12 1037)  . DISCONTD: bivalirudin (ANGIOMAX) infusion 0.5 mg/mL (Non-ACS indications) 0.18 mg/kg/hr (12/31/11 1746)   PRN Meds:.sodium chloride, sodium chloride, sodium chloride,  acetaminophen (TYLENOL) oral liquid 160 mg/5 mL, sodium chloride, sodium chloride  Labs:  CMP     Component Value Date/Time   NA 140 01/01/2012 0200   K 4.5 01/01/2012 0200   CL 102 01/01/2012 0200   CO2 31 01/01/2012 0200   GLUCOSE 98 01/01/2012 0200   BUN 38* 01/01/2012 0200   CREATININE 0.93 01/01/2012 0200   CALCIUM 8.7 01/01/2012 0200   PROT 5.1* 12/26/2011 0347   ALBUMIN 2.4* 12/26/2011 0347   AST 308* 12/26/2011 0347   ALT 363* 12/26/2011 0347   ALKPHOS 55 12/26/2011 0347   BILITOT 0.4 12/26/2011 0347   GFRNONAA >90 01/01/2012 0200   GFRAA >90 01/01/2012 0200     Intake/Output Summary (Last 24 hours) at 01/01/12 1145 Last data filed at 01/01/12 1047  Gross per 24 hour  Intake 5165.87 ml  Output   7200 ml  Net -2034.13 ml    Weight Status:  156 lbs, trending down to admission weight  Re-estimated needs:  2034 kcal, 110-140 gm protein   Nutrition Dx:  Inadequate oral intake, ongoing  Goal:  TF will meet> 90% of estimated nutrition needs while intubated.   Intervention:   1. RD will adjust TF to meet nutrition needs 2. Decrease Oxepa to 45 ml/hr.  3. Increase Pro-stat to 4 times daily  This will provide 2020 kcal (99%)  and 127 gm protein (100%).  4. RD will continue to follow and adjust TF as needed.    Monitor:  Vent/extubation, weight, labs, I/O's   Rudean Haskell Pager #:  [phone]

## 2012-01-01 NOTE — Progress Notes (Signed)
ANTICOAGULATION CONSULT NOTE - Follow Up Consult  Pharmacy Consult for Angiomax Indication: r/o HIT  Labs:  Basename 01/01/12 0519 01/01/12 0200 12/31/11 2100 12/31/11 1226 12/31/11 0745 12/31/11 0709 12/30/11 0420  HGB -- 10.8* -- -- -- 10.2* --  HCT -- 30.9* -- -- -- 29.7* 31.1*  PLT -- 150 -- -- -- 85* 65*  APTT 82* -- 75* -- 81* -- --  LABPROT -- -- -- -- -- -- --  INR -- -- -- -- -- -- --  HEPARINUNFRC -- -- -- -- -- -- --  CREATININE -- 0.93 -- 0.89 -- -- 0.95  CKTOTAL -- -- -- -- -- -- --  CKMB -- -- -- -- -- -- --  TROPONINI -- -- -- -- -- -- --    Assessment/Plan: 46yo male remains therapeutic on Angiomax.  Noted that Plt is increasing (65-->85-->150).  Will continue at current rate and continue to monitor.   Colleen Can PharmD BCPS 01/01/2012,6:29 AM

## 2012-01-02 ENCOUNTER — Inpatient Hospital Stay (HOSPITAL_COMMUNITY): Payer: Medicaid Other

## 2012-01-02 LAB — HEPATIC FUNCTION PANEL
Albumin: 2 g/dL — ABNORMAL LOW (ref 3.5–5.2)
Alkaline Phosphatase: 186 U/L — ABNORMAL HIGH (ref 39–117)
Bilirubin, Direct: 0.2 mg/dL (ref 0.0–0.3)
Total Bilirubin: 0.5 mg/dL (ref 0.3–1.2)

## 2012-01-02 LAB — CULTURE, BAL-QUANTITATIVE W GRAM STAIN

## 2012-01-02 LAB — BLOOD GAS, ARTERIAL
Acid-Base Excess: 3.7 mmol/L — ABNORMAL HIGH (ref 0.0–2.0)
Drawn by: 358491
FIO2: 0.4 %
MECHVT: 620 mL
O2 Saturation: 96.1 %
PEEP: 5 cmH2O
Patient temperature: 98.6
RATE: 16 resp/min
pO2, Arterial: 86.3 mmHg (ref 80.0–100.0)

## 2012-01-02 LAB — CBC
HCT: 31.2 % — ABNORMAL LOW (ref 39.0–52.0)
Hemoglobin: 10.6 g/dL — ABNORMAL LOW (ref 13.0–17.0)
MCH: 31.9 pg (ref 26.0–34.0)
MCV: 94 fL (ref 78.0–100.0)
Platelets: 215 10*3/uL (ref 150–400)
RBC: 3.32 MIL/uL — ABNORMAL LOW (ref 4.22–5.81)
WBC: 13.8 10*3/uL — ABNORMAL HIGH (ref 4.0–10.5)

## 2012-01-02 LAB — GLUCOSE, CAPILLARY
Glucose-Capillary: 114 mg/dL — ABNORMAL HIGH (ref 70–99)
Glucose-Capillary: 115 mg/dL — ABNORMAL HIGH (ref 70–99)
Glucose-Capillary: 98 mg/dL (ref 70–99)

## 2012-01-02 LAB — BASIC METABOLIC PANEL
BUN: 44 mg/dL — ABNORMAL HIGH (ref 6–23)
CO2: 29 mEq/L (ref 19–32)
Chloride: 103 mEq/L (ref 96–112)
Creatinine, Ser: 0.88 mg/dL (ref 0.50–1.35)
Glucose, Bld: 92 mg/dL (ref 70–99)
Potassium: 5.2 mEq/L — ABNORMAL HIGH (ref 3.5–5.1)

## 2012-01-02 LAB — HEPARIN INDUCED THROMBOCYTOPENIA PNL
UFH High Dose UFH H: 0 % Release
UFH Low Dose 0.1 IU/mL: 0 % Release
UFH Low Dose 0.5 IU/mL: 0 % Release
UFH SRA Result: NEGATIVE

## 2012-01-02 MED ORDER — FUROSEMIDE 10 MG/ML IJ SOLN
40.0000 mg | Freq: Three times a day (TID) | INTRAMUSCULAR | Status: AC
  Administered 2012-01-02 (×2): 40 mg via INTRAVENOUS
  Filled 2012-01-02 (×2): qty 4

## 2012-01-02 MED ORDER — MIDAZOLAM HCL 2 MG/2ML IJ SOLN
2.0000 mg | INTRAMUSCULAR | Status: DC | PRN
  Administered 2012-01-04 – 2012-01-05 (×3): 2 mg via INTRAVENOUS
  Administered 2012-01-05 – 2012-01-06 (×3): 4 mg via INTRAVENOUS
  Filled 2012-01-02 (×2): qty 4
  Filled 2012-01-02: qty 2
  Filled 2012-01-02: qty 4

## 2012-01-02 MED ORDER — LIDOCAINE HCL (CARDIAC) 20 MG/ML IV SOLN
INTRAVENOUS | Status: AC
  Filled 2012-01-02: qty 5

## 2012-01-02 MED ORDER — ETOMIDATE 2 MG/ML IV SOLN
INTRAVENOUS | Status: AC
  Filled 2012-01-02: qty 20

## 2012-01-02 MED ORDER — FENTANYL CITRATE 0.05 MG/ML IJ SOLN
25.0000 ug | INTRAMUSCULAR | Status: DC | PRN
  Administered 2012-01-04 (×2): 100 ug via INTRAVENOUS
  Administered 2012-01-05 – 2012-01-14 (×5): 50 ug via INTRAVENOUS
  Filled 2012-01-02 (×7): qty 2

## 2012-01-02 MED ORDER — ROCURONIUM BROMIDE 50 MG/5ML IV SOLN
INTRAVENOUS | Status: AC
  Administered 2012-01-02: 100 mg
  Filled 2012-01-02: qty 2

## 2012-01-02 MED ORDER — SUCCINYLCHOLINE CHLORIDE 20 MG/ML IJ SOLN
INTRAMUSCULAR | Status: AC
  Filled 2012-01-02: qty 10

## 2012-01-02 MED ORDER — SODIUM CHLORIDE 0.9 % IV SOLN
0.2000 ug/kg/h | INTRAVENOUS | Status: AC
  Administered 2012-01-02: 0.4 ug/kg/h via INTRAVENOUS
  Filled 2012-01-02: qty 2

## 2012-01-02 MED ORDER — HEPARIN (PORCINE) IN NACL 100-0.45 UNIT/ML-% IJ SOLN
2200.0000 [IU]/h | INTRAMUSCULAR | Status: AC
  Administered 2012-01-02: 1200 [IU]/h via INTRAVENOUS
  Administered 2012-01-03: 1800 [IU]/h via INTRAVENOUS
  Filled 2012-01-02 (×4): qty 250

## 2012-01-02 NOTE — Procedures (Signed)
Intubation Procedure Note ANIAS BARTOL [id number] 03-Dec-1965  Procedure: Intubation Indications: ETT needed to be changed  Procedure Details Consent: Unable to obtain consent because of emergent medical necessity. Time Out: Verified patient identification, verified procedure, site/side was marked, verified correct patient position, special equipment/implants available, medications/allergies/relevent history reviewed, required imaging and test results available.  Performed  Maximum sterile technique was used including antiseptics, gloves, gown, hand hygiene and mask.  MAC    Evaluation Hemodynamic Status: BP stable throughout; O2 sats: stable throughout Patient's Current Condition: stable Complications: No apparent complications Patient did tolerate procedure well. Chest X-ray ordered to verify placement.  CXR: pending.   Koren Bound 01/02/2012

## 2012-01-02 NOTE — Progress Notes (Signed)
ANTICOAGULATION/Antibiotics CONSULT NOTE - Follow Up Consult  Pharmacy Consult for  Angiomax r/o HIT Vancomcyin/zosyn for suspected pneumonia  Labs:  Basename 01/02/12 0430 01/01/12 1039 01/01/12 0519 01/01/12 0200 12/31/11 2100 12/31/11 1226  HGB 10.6* 9.3* -- -- -- --  HCT 31.2* 27.3* -- 30.9* -- --  PLT 215 143* -- 150 -- --  APTT 81* -- 82* -- 75* --  LABPROT -- -- -- -- -- --  INR -- -- -- -- -- --  HEPARINUNFRC -- -- -- -- -- --  CREATININE 0.88 -- -- 0.93 -- 0.89  CKTOTAL -- -- -- -- -- --  CKMB -- -- -- -- -- --  TROPONINI -- -- -- -- -- --   Cultures:  Blood 4/21: Haemophilus Influenzae x 1. Beta-lactamase neg Urine 4/21: Negative. Trach asp 4/21: Normal flora. Resp 4/26-normal flora ucx 4/27-ng BAL 4/27-normal flora Bld 4/27-ngtd  Vancomycin 4/22 >> 4/25 Restart 4/28>> Zosyn 4/22 >>4/28 Imipenem 4/28>>  Assessment/Plan: 46yo male remains therapeutic on Angiomax.  Noted that Plt is increasing (65-->85-->150>>215). No bleeding complications ntoed. Will continue at current rate and continue to monitor.  Currently on day #3 vancomycin and #2 primaxin for suspected pneumonia. Slight fever with tmax of 100.4, wbc curve stable overall but increased to 13.8 from yesterday. Excellent renal function with over 5liters of uop. Will plan on checking vancomycin trough 01/03/12 if therapy is to continue. No changes warranted for primaxin. Will follow final results of repeat blood cx.   Severiano Gilbert PharmD BCPS 01/02/2012,9:26 AM

## 2012-01-02 NOTE — Procedures (Signed)
Bronchoscopy Procedure Note JAYLEON MCFARLANE [id number] [birth year]  Procedure: Bronchoscopy Indications: Diagnostic evaluation of the airways, Obtain specimens for culture and/or other diagnostic studies and Remove secretions  Procedure Details Consent: Risks of procedure as well as the alternatives and risks of each were explained to the (patient/caregiver).  Consent for procedure obtained. Time Out: Verified patient identification, verified procedure, site/side was marked, verified correct patient position, special equipment/implants available, medications/allergies/relevent history reviewed, required imaging and test results available.  Performed  In preparation for procedure, patient was given 100% FiO2 and bronchoscope lubricated. Sedation: Benzodiazepines and Muscle relaxants  Airway entered and the following bronchi were examined: RUL, RML, RLL, LUL, LLL and Bronchi.   Procedures performed: Brushings performed Bronchoscope removed.  , Patient placed back on 100% FiO2 at conclusion of procedure.    Evaluation Hemodynamic Status: BP stable throughout; O2 sats: stable throughout Patient's Current Condition: stable Specimens:  Sent serosanguinous fluid Complications: No apparent complications Patient did tolerate procedure well.  BAL for bacterial, fungal and AFB stain and culture, cell count with differential.  Koren Bound 01/02/2012

## 2012-01-02 NOTE — Progress Notes (Signed)
Subjective: Patient clinical status unchanged.  EEG and head CT repeated on yesterday.  They show no changes.  EEG remains slow.  Head CT shows no evidence of anoxic injury.    Objective: Current vital signs: BP 103/61  Pulse 90  Temp(Src) 99.3 F (37.4 C) (Core (Comment))  Resp 20  Ht 6' (1.829 m)  Wt 71.2 kg (156 lb 15.5 oz)  BMI 21.29 kg/m2  SpO2 94% Vital signs in last 24 hours: Temp:  [99 F (37.2 C)-100.4 F (38 C)] 99.3 F (37.4 C) (04/30 1100) Pulse Rate:  [80-105] 90  (04/30 1100) Resp:  [20-33] 20  (04/30 1100) BP: (90-159)/(39-77) 103/61 mmHg (04/30 1100) SpO2:  [93 %-100 %] 94 % (04/30 1100) FiO2 (%):  [39.9 %-40.2 %] 40 % (04/30 1100) Weight:  [71.2 kg (156 lb 15.5 oz)] 71.2 kg (156 lb 15.5 oz) (04/30 0500)  Intake/Output from previous day: 04/29 0701 - 04/30 0700 In: 9604.5 [I.V.:1939.2; NG/GT:3080; IV Piggyback:950] Out: 5195 [Urine:5195] Intake/Output this shift: Total I/O In: 481.9 [I.V.:312.9; NG/GT:165; IV Piggyback:4] Out: 720 [Urine:720] Nutritional status: NPO  Neurologic Exam: Intubated on Fentanyl and Versed that has just been administered. Able to localize to pain with right upper extremity.  Does not follow commands  Cranial Nerves:  II-No blink to threat  III/IV/VI-positive doll's Pupils reactive bilaterally.  V/VII-Smile symmetric when grimmices  VIII-unable to test  IX/X-not tested XI-unable to test Motor: Only noted to move the right upper extremity today on exam.  Prior to medication the nursing staff reports that hthe patient was localizing with both upper extremities and able to raise off the bed. Sensory: No response to painful stimuli in the extremities  Deep Tendon Reflexes: 3+ and symmetric throughout  Plantars: mute bilaterally   Lab Results: Results for orders placed during the hospital encounter of 12/24/11 (from the past 48 hour(s))  BASIC METABOLIC PANEL     Status: Abnormal   Collection Time   12/31/11 12:26 PM   Component Value Range Comment   Sodium 141  135 - 145 (mEq/L)    Potassium 4.4  3.5 - 5.1 (mEq/L)    Chloride 103  96 - 112 (mEq/L)    CO2 30  19 - 32 (mEq/L)    Glucose, Bld 85  70 - 99 (mg/dL)    BUN 37 (*) 6 - 23 (mg/dL)    Creatinine, Ser 4.09  0.50 - 1.35 (mg/dL)    Calcium 8.6  8.4 - 10.5 (mg/dL)    GFR calc non Af Amer >90  >90 (mL/min)    GFR calc Af Amer >90  >90 (mL/min)   GLUCOSE, CAPILLARY     Status: Abnormal   Collection Time   12/31/11  2:56 PM      Component Value Range Comment   Glucose-Capillary 113 (*) 70 - 99 (mg/dL)    Comment 1 Notify RN     GLUCOSE, CAPILLARY     Status: Abnormal   Collection Time   12/31/11  8:57 PM      Component Value Range Comment   Glucose-Capillary 114 (*) 70 - 99 (mg/dL)   APTT     Status: Abnormal   Collection Time   12/31/11  9:00 PM      Component Value Range Comment   aPTT 75 (*) 24 - 37 (seconds)   GLUCOSE, CAPILLARY     Status: Normal   Collection Time   12/31/11 11:55 PM      Component Value Range Comment  Glucose-Capillary 96  70 - 99 (mg/dL)   MAGNESIUM     Status: Normal   Collection Time   01/01/12  2:00 AM      Component Value Range Comment   Magnesium 1.9  1.5 - 2.5 (mg/dL)   PHOSPHORUS     Status: Abnormal   Collection Time   01/01/12  2:00 AM      Component Value Range Comment   Phosphorus 4.7 (*) 2.3 - 4.6 (mg/dL)   CBC     Status: Abnormal   Collection Time   01/01/12  2:00 AM      Component Value Range Comment   WBC 14.8 (*) 4.0 - 10.5 (K/uL)    RBC 3.31 (*) 4.22 - 5.81 (MIL/uL)    Hemoglobin 10.8 (*) 13.0 - 17.0 (g/dL)    HCT 16.1 (*) 09.6 - 52.0 (%)    MCV 93.4  78.0 - 100.0 (fL)    MCH 32.6  26.0 - 34.0 (pg)    MCHC 35.0  30.0 - 36.0 (g/dL)    RDW 04.5  40.9 - 81.1 (%)    Platelets 150  150 - 400 (K/uL)   BASIC METABOLIC PANEL     Status: Abnormal   Collection Time   01/01/12  2:00 AM      Component Value Range Comment   Sodium 140  135 - 145 (mEq/L)    Potassium 4.5  3.5 - 5.1 (mEq/L)     Chloride 102  96 - 112 (mEq/L)    CO2 31  19 - 32 (mEq/L)    Glucose, Bld 98  70 - 99 (mg/dL)    BUN 38 (*) 6 - 23 (mg/dL)    Creatinine, Ser 9.14  0.50 - 1.35 (mg/dL)    Calcium 8.7  8.4 - 10.5 (mg/dL)    GFR calc non Af Amer >90  >90 (mL/min)    GFR calc Af Amer >90  >90 (mL/min)   APTT     Status: Abnormal   Collection Time   01/01/12  5:19 AM      Component Value Range Comment   aPTT 82 (*) 24 - 37 (seconds)   GLUCOSE, CAPILLARY     Status: Abnormal   Collection Time   01/01/12  8:06 AM      Component Value Range Comment   Glucose-Capillary 111 (*) 70 - 99 (mg/dL)   CBC     Status: Abnormal   Collection Time   01/01/12 10:39 AM      Component Value Range Comment   WBC 12.6 (*) 4.0 - 10.5 (K/uL)    RBC 2.86 (*) 4.22 - 5.81 (MIL/uL)    Hemoglobin 9.3 (*) 13.0 - 17.0 (g/dL)    HCT 78.2 (*) 95.6 - 52.0 (%)    MCV 95.5  78.0 - 100.0 (fL)    MCH 32.5  26.0 - 34.0 (pg)    MCHC 34.1  30.0 - 36.0 (g/dL)    RDW 21.3  08.6 - 57.8 (%)    Platelets 143 (*) 150 - 400 (K/uL)   GLUCOSE, CAPILLARY     Status: Abnormal   Collection Time   01/01/12  1:31 PM      Component Value Range Comment   Glucose-Capillary 106 (*) 70 - 99 (mg/dL)   GLUCOSE, CAPILLARY     Status: Normal   Collection Time   01/01/12  4:26 PM      Component Value Range Comment   Glucose-Capillary 86  70 - 99 (mg/dL)  GLUCOSE, CAPILLARY     Status: Abnormal   Collection Time   01/01/12  7:46 PM      Component Value Range Comment   Glucose-Capillary 103 (*) 70 - 99 (mg/dL)   GLUCOSE, CAPILLARY     Status: Normal   Collection Time   01/02/12 12:18 AM      Component Value Range Comment   Glucose-Capillary 98  70 - 99 (mg/dL)   GLUCOSE, CAPILLARY     Status: Normal   Collection Time   01/02/12  3:06 AM      Component Value Range Comment   Glucose-Capillary 83  70 - 99 (mg/dL)   CBC     Status: Abnormal   Collection Time   01/02/12  4:30 AM      Component Value Range Comment   WBC 13.8 (*) 4.0 - 10.5 (K/uL)    RBC  3.32 (*) 4.22 - 5.81 (MIL/uL)    Hemoglobin 10.6 (*) 13.0 - 17.0 (g/dL)    HCT 96.2 (*) 95.2 - 52.0 (%)    MCV 94.0  78.0 - 100.0 (fL)    MCH 31.9  26.0 - 34.0 (pg)    MCHC 34.0  30.0 - 36.0 (g/dL)    RDW 84.1  32.4 - 40.1 (%)    Platelets 215  150 - 400 (K/uL)   APTT     Status: Abnormal   Collection Time   01/02/12  4:30 AM      Component Value Range Comment   aPTT 81 (*) 24 - 37 (seconds)   BASIC METABOLIC PANEL     Status: Abnormal   Collection Time   01/02/12  4:30 AM      Component Value Range Comment   Sodium 139  135 - 145 (mEq/L)    Potassium 5.2 (*) 3.5 - 5.1 (mEq/L)    Chloride 103  96 - 112 (mEq/L)    CO2 29  19 - 32 (mEq/L)    Glucose, Bld 92  70 - 99 (mg/dL)    BUN 44 (*) 6 - 23 (mg/dL)    Creatinine, Ser 0.27  0.50 - 1.35 (mg/dL)    Calcium 8.8  8.4 - 10.5 (mg/dL)    GFR calc non Af Amer >90  >90 (mL/min)    GFR calc Af Amer >90  >90 (mL/min)   MAGNESIUM     Status: Normal   Collection Time   01/02/12  4:30 AM      Component Value Range Comment   Magnesium 2.2  1.5 - 2.5 (mg/dL)   PHOSPHORUS     Status: Abnormal   Collection Time   01/02/12  4:30 AM      Component Value Range Comment   Phosphorus 4.8 (*) 2.3 - 4.6 (mg/dL)   HEPATIC FUNCTION PANEL     Status: Abnormal   Collection Time   01/02/12  4:30 AM      Component Value Range Comment   Total Protein 5.8 (*) 6.0 - 8.3 (g/dL)    Albumin 2.0 (*) 3.5 - 5.2 (g/dL)    AST 34  0 - 37 (U/L)    ALT 75 (*) 0 - 53 (U/L)    Alkaline Phosphatase 186 (*) 39 - 117 (U/L)    Total Bilirubin 0.5  0.3 - 1.2 (mg/dL)    Bilirubin, Direct 0.2  0.0 - 0.3 (mg/dL)    Indirect Bilirubin 0.3  0.3 - 0.9 (mg/dL)   BLOOD GAS, ARTERIAL     Status: Abnormal  Collection Time   01/02/12  4:43 AM      Component Value Range Comment   FIO2 0.40      Delivery systems VENTILATOR      Mode PRESSURE REGULATED VOLUME CONTROL      VT 620      Rate 16      Peep/cpap 5.0      pH, Arterial 7.414  7.350 - 7.450     pCO2 arterial 44.6   35.0 - 45.0 (mmHg)    pO2, Arterial 86.3  80.0 - 100.0 (mmHg)    Bicarbonate 28.0 (*) 20.0 - 24.0 (mEq/L)    TCO2 29.3  0 - 100 (mmol/L)    Acid-Base Excess 3.7 (*) 0.0 - 2.0 (mmol/L)    O2 Saturation 96.1      Patient temperature 98.6      Collection site LEFT RADIAL      Drawn by [phone]      Sample type ARTERIAL DRAW      Allens test (pass/fail) PASS  PASS    GLUCOSE, CAPILLARY     Status: Abnormal   Collection Time   01/02/12  7:44 AM      Component Value Range Comment   Glucose-Capillary 100 (*) 70 - 99 (mg/dL)    Comment 1 Notify RN     GLUCOSE, CAPILLARY     Status: Abnormal   Collection Time   01/02/12 11:47 AM      Component Value Range Comment   Glucose-Capillary 111 (*) 70 - 99 (mg/dL)    Comment 1 Notify RN       Recent Results (from the past 240 hour(s))  CULTURE, BLOOD (ROUTINE X 2)     Status: Normal   Collection Time   12/24/11  8:11 PM      Component Value Range Status Comment   Specimen Description BLOOD RIGHT ARM   Final    Special Requests BOTTLES DRAWN AEROBIC ONLY 5CC   Final    Culture  Setup Time [id number]   Final    Culture NO GROWTH 5 DAYS   Final    Report Status 12/31/2011 FINAL   Final   URINE CULTURE     Status: Normal   Collection Time   12/24/11  8:28 PM      Component Value Range Status Comment   Specimen Description URINE, CATHETERIZED   Final    Special Requests NONE   Final    Culture  Setup Time [id number]   Final    Colony Count NO GROWTH   Final    Culture NO GROWTH   Final    Report Status 12/26/2011 FINAL   Final   CULTURE, BLOOD (ROUTINE X 2)     Status: Normal (Preliminary result)   Collection Time   12/24/11  8:48 PM      Component Value Range Status Comment   Specimen Description BLOOD LEFT HAND   Final    Special Requests BOTTLES DRAWN AEROBIC AND ANAEROBIC 10CC EA   Final    Culture  Setup Time [id number]   Final    Culture     Final    Value: HAEMOPHILUS INFLUENZAE     Note: BETA LACTAMASE NEGATIVE CRITICAL RESULT  CALLED TO, READ BACK BY AND VERIFIED WITH: KAMALA GOODE 1450 12/27/11 BY KRAWS FAXED TO BETTY ROGERS AT [facility] BY LYONK Referred to [facility] in [place] Ser[place].     Note: Gram Stain Report Called to,Read Back  By and Verified With: CATHERINE COMAN @0218  ON 12/26/11 BY MCLET   Report Status PENDING   Incomplete   MRSA PCR SCREENING     Status: Normal   Collection Time   12/24/11 10:01 PM      Component Value Range Status Comment   MRSA by PCR NEGATIVE  NEGATIVE  Final   CULTURE, RESPIRATORY     Status: Normal   Collection Time   12/24/11 11:13 PM      Component Value Range Status Comment   Specimen Description TRACHEAL ASPIRATE   Final    Special Requests NONE   Final    Gram Stain     Final    Value: FEW WBC PRESENT,BOTH PMN AND MONONUCLEAR     NO SQUAMOUS EPITHELIAL CELLS SEEN     FEW GRAM POSITIVE COCCI IN PAIRS     MODERATE GRAM NEGATIVE RODS   Culture Non-Pathogenic Oropharyngeal-type Flora Isolated.   Final    Report Status 12/26/2011 FINAL   Final   CULTURE, RESPIRATORY     Status: Normal   Collection Time   12/29/11 11:26 AM      Component Value Range Status Comment   Specimen Description OTHER   Final    Special Requests ETT SUCTION   Final    Gram Stain     Final    Value: FEW WBC PRESENT,BOTH PMN AND MONONUCLEAR     NO SQUAMOUS EPITHELIAL CELLS SEEN     NO ORGANISMS SEEN   Culture Non-Pathogenic Oropharyngeal-type Flora Isolated.   Final    Report Status 01/01/2012 FINAL   Final   CULTURE, BLOOD (ROUTINE X 2)     Status: Normal (Preliminary result)   Collection Time   12/30/11 11:08 AM      Component Value Range Status Comment   Specimen Description BLOOD ARM RIGHT   Final    Special Requests BOTTLES DRAWN AEROBIC AND ANAEROBIC 10CC EACH   Final    Culture  Setup Time [id number]   Final    Culture     Final    Value:        BLOOD CULTURE RECEIVED NO GROWTH TO DATE CULTURE WILL BE HELD FOR 5 DAYS BEFORE ISSUING A FINAL NEGATIVE  REPORT   Report Status PENDING   Incomplete   CULTURE, BLOOD (ROUTINE X 2)     Status: Normal (Preliminary result)   Collection Time   12/30/11 11:20 AM      Component Value Range Status Comment   Specimen Description BLOOD ARM RIGHT   Final    Special Requests BOTTLES DRAWN AEROBIC ONLY 10CC    Final    Culture  Setup Time [id number]   Final    Culture     Final    Value:        BLOOD CULTURE RECEIVED NO GROWTH TO DATE CULTURE WILL BE HELD FOR 5 DAYS BEFORE ISSUING A FINAL NEGATIVE REPORT   Report Status PENDING   Incomplete   URINE CULTURE     Status: Normal   Collection Time   12/30/11 11:22 AM      Component Value Range Status Comment   Specimen Description URINE, CATHETERIZED   Final    Special Requests NONE   Final    Culture  Setup Time [id number]   Final    Colony Count NO GROWTH   Final    Culture NO GROWTH   Final    Report Status 12/31/2011 FINAL   Final  CULTURE, BAL-QUANTITATIVE     Status: Normal   Collection Time   12/30/11 12:51 PM      Component Value Range Status Comment   Specimen Description BRONCHIAL ALVEOLAR LAVAGE   Final    Special Requests NONE   Final    Gram Stain     Final    Value: MODERATE WBC PRESENT, PREDOMINANTLY PMN     NO SQUAMOUS EPITHELIAL CELLS SEEN     NO ORGANISMS SEEN   Colony Count 2,000 COLONIES/ML   Final    Culture Non-Pathogenic Oropharyngeal-type Flora Isolated.   Final    Report Status 01/02/2012 FINAL   Final     Lipid Panel No results found for this basename: CHOL,TRIG,HDL,CHOLHDL,VLDL,LDLCALC in the last 72 hours  Studies/Results: Ct Head Wo Contrast  01/01/2012  *RADIOLOGY REPORT*  Clinical Data: Cardiac arrest  CT HEAD WITHOUT CONTRAST  Technique:  Contiguous axial images were obtained from the base of the skull through the vertex without contrast.  Comparison: Head CT 12/24/2011  Findings: No acute intracranial hemorrhage.  No focal mass lesion. No CT evidence of acute infarction.   No midline shift or mass effect.  No  hydrocephalus.  Basilar cisterns are patent.  There is extensive fluid within the maxillary sinuses which is increased in the left maxillary sinus.  Small of fluid in the right mastoid air cells which is also new.  IMPRESSION: 1.  No acute intracranial findings. 2.  Sinusitis and fluid in the right mastoid air cells.  Original Report Authenticated By: Genevive Bi, M.D.   Dg Chest Port 1 View  01/02/2012  *RADIOLOGY REPORT*  Clinical Data: Assess endotracheal tube placement.  PORTABLE CHEST - 1 VIEW  Comparison: Chest x-ray 01/01/2012.  Findings: An endotracheal tube is in place with tip 4.3 cm above the carina. There is a right-sided internal jugular central venous catheter with tip terminating in the distal superior vena cava.  A nasogastric tube is seen extending into the stomach.  There continues to be extensive airspace consolidation throughout the right lung, with a moderate right-sided posterior layering pleural effusion.  The left lung appears relatively clear with some mild perihilar vascular prominence.  Heart size is normal.  Mediastinal contours are unremarkable.  IMPRESSION: 1.  Support apparatus, as above. 2.  Persistent right-sided pneumonia with moderate posterior layering right-sided pleural effusion. 3.  Mild pulmonary vascular congestion again noted.  Original Report Authenticated By: Florencia Reasons, M.D.   Dg Chest Port 1 View  01/01/2012  *RADIOLOGY REPORT*  Clinical Data: Pneumonia.  Check endotracheal tube position.  PORTABLE CHEST - 1 VIEW  Comparison: Portable chest 12/31/2011.  Findings: The endotracheal tube terminates at the level of the clavicles, well above the carina.  A right IJ line is stable in the distal SVC.  The NG tube courses off the inferior border of the film.  The heart size is normal.  A right upper lobe pneumonia persists. Moderate pulmonary vascular congestion is noted bilaterally. Generalized density in the right lung likely represents asymmetric edema.   IMPRESSION:  1.  Persistent right upper lobe pneumonia. 2.  The support apparatus is stable and in satisfactory position. 3.  Stable moderate pulmonary vascular congestion.  Original Report Authenticated By: Jamesetta Orleans. MATTERN, M.D.    Medications:  I have reviewed the patient's current medications. Scheduled:   . antiseptic oral rinse  15 mL Mouth Rinse QID  . chlorhexidine  15 mL Mouth Rinse BID  . enalaprilat  0.625 mg Intravenous Q6H  .  etomidate      . feeding supplement  30 mL Per Tube QID  . folic acid  1 mg Oral Daily  . free water  250 mL Per Tube Q4H  . furosemide  40 mg Intravenous Q8H  . haloperidol lactate  5 mg Intravenous Q6H  . imipenem-cilastatin  500 mg Intravenous Q6H  . insulin aspart  0-9 Units Subcutaneous Q4H  . lidocaine (cardiac) 100 mg/72ml      . mulitivitamin  5 mL Oral Daily  . pantoprazole sodium  40 mg Per Tube Q1200  . QUEtiapine  25 mg Oral BID  . rocuronium      . sodium chloride  3 mL Intravenous Q12H  . sodium chloride  3 mL Intravenous Q12H  . succinylcholine      . thiamine  100 mg Oral Daily  . vancomycin  1,250 mg Intravenous Q12H  . DISCONTD: folic acid  1 mg Intravenous Daily  . DISCONTD: potassium chloride  40 mEq Per Tube BID  . DISCONTD: thiamine  100 mg Intravenous Daily    Assessment/Plan:  Patient Active Hospital Problem List: Altered mental status (12/25/2011)   Assessment: Patient has not regained baseline mental status and is now over a week out from his rewarming.  Mental status has not improved over this period of time.  EEG remains slow.  CT does not show evidence of any diffuse anoxic injury.  Patient continues to have multiple issues that may be influencing his mental status and slowing his improvement to include infection, multi-organ damage and his illicit drug and alcohol use. He is clearly not brain dead and has been noted to follow some commands during past examinations (4/29).  Unfortunately though his probability of  a functional recovery due to hid multiple co-morbidities is extremely poor.   Plan: 1. No further neurologic intervention is recommended at this time.   Family not available at patient visit.     LOS: 9 days   Thana Farr, MD [facility] [phone] 01/02/2012  12:04 PM

## 2012-01-02 NOTE — Procedures (Signed)
EEG NUMBER:  REFERRING PHYSICIAN:  Dr. Katrinka Blazing.  HISTORY:  A 46 year old male, unresponsive status post cardiac arrest.  MEDICATIONS:  Angiomax, Vasotec, Oxepa, fentanyl, Versed, Folvite, Lasix, Primaxin, NovoLog, Protonix, Seroquel, thiamine, vancomycin.  CONDITIONS OF RECORDING:  This is a 16 channel EEG carried out with the patient in the unresponsive state.  DESCRIPTION:  The background activity consists of a polymorphic delta activity that is seen diffusely distributed over all quadrants.  The frequency of the polymorphic delta activity is approximately 2-3 Hz. This activity is poorly organized.  Frequently superimposed on this polymorphic delta activity is a well organized low-voltage alpha rhythm. This is seen intermittently during the tracing and is symmetric over both hemispheres.  The patient is given painful stimuli during the tracing.  Although, the patient grimaces, no change in the background rhythm of the EEG is noted.  Hypoventilation was not performed. Intermittent photic stimulation failed to elicit any change in the tracing.  IMPRESSION:  This is an abnormal EEG secondary to general background slowing.  No epileptiform activity is noted.  This finding is consistent with a diffuse disturbance that is etiologically nonspecific, but may include a metabolic encephalopathy among other possibilities.  COMMENT:  This EEG is unchanged from the patient's EEG of December 27, 2011.          ______________________________ Thana Farr, MD    [id number] D:  01/01/2012 17:22:03  T:  01/01/2012 21:53:24  Job #:  [id number]

## 2012-01-02 NOTE — Progress Notes (Signed)
PROGRESS NOTE  Subjective:   Patient is a 46 y.o. male with a PMHx of drug abuse, who was admitted to [facility] on 12/24/2011 for evaluation of cardiac arrest.  The patient was apparently in a motel room doing drugs. His friend found him to be unconscious. EMS was called and they found him to be in pulseless electrical activity arrest. ACLS was started and the patient was resuscitated. The patient was brought to the closest emergency room and was admitted by the pulmonary critical care team.  Hypothermic cooling was initiated in the field with the infusion of iced saline. He was placed on the [place] protocol and has now been re-warmed.  He became very aggitated when the sedation was stopped so he has be continuously sedated since then.  The patient is sedated and is on the ventilator.  Several attempts to allow him to wake up have failed due to aggitation.  His lung function is very poor ( aspiration pneumonia, long smoking hx)   Objective:    Vital Signs:   Temp:  [99.1 F (37.3 C)-100.4 F (38 C)] 99.3 F (37.4 C) (04/30 0800) Pulse Rate:  [80-101] 83  (04/30 0800) Resp:  [20-33] 20  (04/30 0800) BP: (90-151)/(39-64) 118/63 mmHg (04/30 0800) SpO2:  [93 %-100 %] 100 % (04/30 0800) FiO2 (%):  [10 %-40.2 %] 40 % (04/30 0800) Weight:  [156 lb 15.5 oz (71.2 kg)] 156 lb 15.5 oz (71.2 kg) (04/30 0500)  Last BM Date: 01/01/12   24-hour weight change: Weight change: 10.6 oz (0.3 kg)  Weight trends: Filed Weights   12/31/11 0500 01/01/12 0500 01/02/12 0500  Weight: 168 lb 10.4 oz (76.5 kg) 156 lb 4.9 oz (70.9 kg) 156 lb 15.5 oz (71.2 kg)    Intake/Output:  04/29 0701 - 04/30 0700 In: 2130.8 [I.V.:1939.2; NG/GT:3080; IV Piggyback:950] Out: 5195 [Urine:5195] Total I/O In: 142.5 [I.V.:82.5; NG/GT:60] Out: 325 [Urine:325]   Physical Exam: BP 118/63  Pulse 83  Temp(Src) 99.3 F (37.4 C) (Core (Comment))  Resp 20  Ht 6' (1.829 m)  Wt 156 lb 15.5 oz (71.2 kg)  BMI 21.29 kg/m2   SpO2 100%  General: Vital signs reviewed and noted. sedated.  Head: Normocephalic, atraumatic.  Eyes: Eyes closed  Throat: Oropharynx nonerythematous, no exudate appreciated.   Neck: Supple. Normal carotids. No JVD  Lungs:  On the vent  Heart: Regular rate,  With normal  S1 S2. No murmurs, rubs, or gallops   Abdomen:  Soft, non-tender, non-distended with normoactive bowel sounds. No hepatomegaly. No rebound/guarding. No abdominal masses.  Extremities: No edema.  Distal pedal pulses are 2+ and equal bilaterally.  Neurologic: Sedated, agitated when off sedation  Psych: sedated    Labs: BMET:  Basename 01/02/12 0430 01/01/12 0200  NA 139 140  K 5.2* 4.5  CL 103 102  CO2 29 31  GLUCOSE 92 98  BUN 44* 38*  CREATININE 0.88 0.93  CALCIUM 8.8 8.7  MG 2.2 1.9  PHOS 4.8* 4.7*    Liver function tests:  Basename 01/02/12 0430  AST 34  ALT 75*  ALKPHOS 186*  BILITOT 0.5  PROT 5.8*  ALBUMIN 2.0*   No results found for this basename: LIPASE:2,AMYLASE:2 in the last 72 hours  CBC:  Basename 01/02/12 0430 01/01/12 1039  WBC 13.8* 12.6*  NEUTROABS -- --  HGB 10.6* 9.3*  HCT 31.2* 27.3*  MCV 94.0 95.5  PLT 215 143*    Cardiac Enzymes: No results found for this basename: CKTOTAL:4,CKMB:4,TROPONINI:4  in the last 72 hours  Coagulation Studies: No results found for this basename: LABPROT:5,INR:5 in the last 72 hours  Other:    Tele:  NSR  Medications:    Infusions:    . sodium chloride 500 mL (01/01/12 0052)  . bivalirudin (ANGIOMAX) infusion 0.5 mg/mL (Non-ACS indications) 0.18 mg/kg/hr (01/01/12 1313)  . feeding supplement (OXEPA) 1,000 mL (01/01/12 1304)  . fentaNYL infusion INTRAVENOUS 300 mcg/hr (01/02/12 0400)  . midazolam (VERSED) infusion 6 mg/hr (01/02/12 0429)  . DISCONTD: feeding supplement (OXEPA) 1,000 mL (12/31/11 1603)    Scheduled Medications:    . antiseptic oral rinse  15 mL Mouth Rinse QID  . chlorhexidine  15 mL Mouth Rinse BID  .  enalaprilat  0.625 mg Intravenous Q6H  . feeding supplement  30 mL Per Tube QID  . folic acid  1 mg Oral Daily  . free water  250 mL Per Tube Q4H  . furosemide  40 mg Intravenous Once  . haloperidol lactate  5 mg Intravenous Q6H  . imipenem-cilastatin  500 mg Intravenous Q6H  . insulin aspart  0-9 Units Subcutaneous Q4H  . magnesium sulfate 1 - 4 g bolus IVPB  2 g Intravenous Once  . mulitivitamin  5 mL Oral Daily  . pantoprazole sodium  40 mg Per Tube Q1200  . potassium chloride  40 mEq Per Tube BID  . QUEtiapine  25 mg Oral BID  . sodium chloride  3 mL Intravenous Q12H  . sodium chloride  3 mL Intravenous Q12H  . thiamine  100 mg Oral Daily  . vancomycin  1,250 mg Intravenous Q12H  . DISCONTD: feeding supplement  30 mL Per Tube BID  . DISCONTD: folic acid  1 mg Intravenous Daily  . DISCONTD: furosemide  40 mg Intravenous Q8H  . DISCONTD: furosemide  40 mg Intravenous Q8H  . DISCONTD: imipenem-cilastatin  500 mg Intravenous Q8H  . DISCONTD: thiamine  100 mg Intravenous Daily    Assessment/ Plan:    1. Cardiac: he has CHF with an EF of 25%.  He currently is sedated and is on the vent and has an abnormal EEG.   He is off pressors Will consider further cardiac evaluation once he is off the vent and is not requiring sedation.  No further recs at this point.   He is critically ill from a pulmonary/ sepsis standpoint.  His prognosis is poor.   EEG is pending.  Will review results.  I would not anticipate referring him for cath if the EEG shows significant abnormalities.   Length of Stay: 9  Vesta Mixer, Montez Hageman., MD, [facility] 01/02/2012, 8:29 AM

## 2012-01-02 NOTE — Progress Notes (Signed)
Name: Angel [name] DOB: 12/17/1965 MRN: [id number] PCP: No primary provider on file. ADMIT DATE: 12/24/2011 LOS: 9  PCCM PROGRESS NOTE  Brief Patient Profile:45 YO man with no past medical history. His family states that he abuses some perscription drugs and some occasional alcohol. Smokes 2 PPD history. Daughter tried to call him all day but he was sleeping which is unusual for him. His friend found him shaking violently around 8 PM and called 911 who intubated in the field for agonal breathing. He was unresponsive at the time. He was brought to the ED and placed on hypothermia protocol. No ischemic EKG changes on ED arrival. UDS positive for cocaine and THC.    Best Practice: DVT: SQ Heparin >> 4/27 (due to low plat), ScD 4/7 and Argotraban (mod prob HIT) 4/27 > SUP: protonix Nutrition: Tube feed Glycemic control:  Sedation/analgesia: Cont sedation (off precedex 4/26)  Lines / Drains: ETT 4/21>>>  CVC 4/21>>>   Micro: Blood 4/21>>>HAEMOPHILUS INFLUENZAE Urine 4/21>>>Neg  Sputum 4/21>>>GNR and GPC - NORMAL FLORA ....................... [facility] 4/27 BAL 4/27  Urine 4/27    Lab 12/30/11 1002  LATICACIDVEN 0.9   Recent Results (from the past 240 hour(s))  CULTURE, BLOOD (ROUTINE X 2)     Status: Normal   Collection Time   12/24/11  8:11 PM      Component Value Range Status Comment   Specimen Description BLOOD RIGHT ARM   Final    Special Requests BOTTLES DRAWN AEROBIC ONLY 5CC   Final    Culture  Setup Time [id number]   Final    Culture NO GROWTH 5 DAYS   Final    Report Status 12/31/2011 FINAL   Final   URINE CULTURE     Status: Normal   Collection Time   12/24/11  8:28 PM      Component Value Range Status Comment   Specimen Description URINE, CATHETERIZED   Final    Special Requests NONE   Final    Culture  Setup Time [id number]   Final    Colony Count NO GROWTH   Final    Culture NO GROWTH   Final    Report Status 12/26/2011 FINAL   Final   CULTURE, BLOOD  (ROUTINE X 2)     Status: Normal (Preliminary result)   Collection Time   12/24/11  8:48 PM      Component Value Range Status Comment   Specimen Description BLOOD LEFT HAND   Final    Special Requests BOTTLES DRAWN AEROBIC AND ANAEROBIC 10CC EA   Final    Culture  Setup Time [id number]   Final    Culture     Final    Value: HAEMOPHILUS INFLUENZAE     Note: BETA LACTAMASE NEGATIVE CRITICAL RESULT CALLED TO, READ BACK BY AND VERIFIED WITH: KAMALA GOODE 1450 12/27/11 BY KRAWS FAXED TO BETTY ROGERS AT [facility] BY LYONK Referred to [facility] in [place], [place] Ser[place].     Note: Gram Stain Report Called to,Read Back By and Verified With: CATHERINE COMAN @0218  ON 12/26/11 BY MCLET   Report Status PENDING   Incomplete   MRSA PCR SCREENING     Status: Normal   Collection Time   12/24/11 10:01 PM      Component Value Range Status Comment   MRSA by PCR NEGATIVE  NEGATIVE  Final   CULTURE, RESPIRATORY     Status: Normal   Collection Time   12/24/11 11:13 PM  Component Value Range Status Comment   Specimen Description TRACHEAL ASPIRATE   Final    Special Requests NONE   Final    Gram Stain     Final    Value: FEW WBC PRESENT,BOTH PMN AND MONONUCLEAR     NO SQUAMOUS EPITHELIAL CELLS SEEN     FEW GRAM POSITIVE COCCI IN PAIRS     MODERATE GRAM NEGATIVE RODS   Culture Non-Pathogenic Oropharyngeal-type Flora Isolated.   Final    Report Status 12/26/2011 FINAL   Final   CULTURE, RESPIRATORY     Status: Normal   Collection Time   12/29/11 11:26 AM      Component Value Range Status Comment   Specimen Description OTHER   Final    Special Requests ETT SUCTION   Final    Gram Stain     Final    Value: FEW WBC PRESENT,BOTH PMN AND MONONUCLEAR     NO SQUAMOUS EPITHELIAL CELLS SEEN     NO ORGANISMS SEEN   Culture Non-Pathogenic Oropharyngeal-type Flora Isolated.   Final    Report Status 01/01/2012 FINAL   Final   CULTURE, BLOOD (ROUTINE X 2)     Status: Normal  (Preliminary result)   Collection Time   12/30/11 11:08 AM      Component Value Range Status Comment   Specimen Description BLOOD ARM RIGHT   Final    Special Requests BOTTLES DRAWN AEROBIC AND ANAEROBIC 10CC EACH   Final    Culture  Setup Time [id number]   Final    Culture     Final    Value:        BLOOD CULTURE RECEIVED NO GROWTH TO DATE CULTURE WILL BE HELD FOR 5 DAYS BEFORE ISSUING A FINAL NEGATIVE REPORT   Report Status PENDING   Incomplete   CULTURE, BLOOD (ROUTINE X 2)     Status: Normal (Preliminary result)   Collection Time   12/30/11 11:20 AM      Component Value Range Status Comment   Specimen Description BLOOD ARM RIGHT   Final    Special Requests BOTTLES DRAWN AEROBIC ONLY 10CC    Final    Culture  Setup Time [id number]   Final    Culture     Final    Value:        BLOOD CULTURE RECEIVED NO GROWTH TO DATE CULTURE WILL BE HELD FOR 5 DAYS BEFORE ISSUING A FINAL NEGATIVE REPORT   Report Status PENDING   Incomplete   URINE CULTURE     Status: Normal   Collection Time   12/30/11 11:22 AM      Component Value Range Status Comment   Specimen Description URINE, CATHETERIZED   Final    Special Requests NONE   Final    Culture  Setup Time [id number]   Final    Colony Count NO GROWTH   Final    Culture NO GROWTH   Final    Report Status 12/31/2011 FINAL   Final   CULTURE, BAL-QUANTITATIVE     Status: Normal   Collection Time   12/30/11 12:51 PM      Component Value Range Status Comment   Specimen Description BRONCHIAL ALVEOLAR LAVAGE   Final    Special Requests NONE   Final    Gram Stain     Final    Value: MODERATE WBC PRESENT, PREDOMINANTLY PMN     NO SQUAMOUS EPITHELIAL CELLS SEEN     NO ORGANISMS SEEN  Colony Count 2,000 COLONIES/ML   Final    Culture Non-Pathogenic Oropharyngeal-type Flora Isolated.   Final    Report Status 01/02/2012 FINAL   Final     Antibiotics: Anti-infectives     Start     Dose/Rate Route Frequency Ordered Stop   01/01/12 1200    imipenem-cilastatin (PRIMAXIN) 500 mg in sodium chloride 0.9 % 100 mL IVPB        500 mg 200 mL/hr over 30 Minutes Intravenous 4 times per day 01/01/12 0943     12/31/11 1400   imipenem-cilastatin (PRIMAXIN) 500 mg in sodium chloride 0.9 % 100 mL IVPB  Status:  Discontinued        500 mg 200 mL/hr over 30 Minutes Intravenous 3 times per day 12/31/11 1121 01/01/12 0943   12/31/11 1300   vancomycin (VANCOCIN) 1,250 mg in sodium chloride 0.9 % 250 mL IVPB        1,250 mg 166.7 mL/hr over 90 Minutes Intravenous Every 12 hours 12/31/11 1120     12/25/11 0600   piperacillin-tazobactam (ZOSYN) IVPB 3.375 g  Status:  Discontinued        3.375 g 12.5 mL/hr over 240 Minutes Intravenous 3 times per day 12/24/11 2257 12/31/11 1028   12/24/11 2359   vancomycin (VANCOCIN) IVPB 1000 mg/200 mL premix  Status:  Discontinued        1,000 mg 200 mL/hr over 60 Minutes Intravenous Daily at bedtime 12/24/11 2301 12/29/11 1009   12/24/11 2045  piperacillin-tazobactam (ZOSYN) 3.375 g in dextrose 5 % 50 mL IVPB       3.375 g 100 mL/hr over 30 Minutes Intravenous To [facility] 12/24/11 2011 12/24/11 2206   12/24/11 2015   vancomycin (VANCOCIN) IVPB 1000 mg/200 mL premix  Status:  Discontinued        1,000 mg 200 mL/hr over 60 Minutes Intravenous  Once 12/24/11 2011 12/24/11 2301          Tests / Events:  4/27 - HIT panel 4/28: agitated on wUA but followed commands.    Consults:   Subjective/Overnight/Interval History:  Extremely agitated on WUA. Moves all 4s. Intense Cough + Gag +. Corneals +. Per RN: followed commands x today to neurologist  Per family on 4/27: localized, had tear in eye at mention of youngest daughter  Copious pink secretions from ET tube +  Vital Signs: Temp:  [99.1 F (37.3 C)-100.4 F (38 C)] 99.3 F (37.4 C) (04/30 0800) Pulse Rate:  [80-101] 87  (04/30 0832) Resp:  [20-33] 28  (04/30 0832) BP: (90-151)/(39-64) 118/63 mmHg (04/30 0832) SpO2:  [93 %-100  %] 97 % (04/30 0832) FiO2 (%):  [10 %-40.2 %] 40 % (04/30 0832) Weight:  [71.2 kg (156 lb 15.5 oz)] 71.2 kg (156 lb 15.5 oz) (04/30 0500) I/O last 3 completed shifts: In: 8399.9 [I.V.:3041.9; NG/GT:3950; IV Piggyback:1408] Out: 8595 [Urine:8595]  Intake/Output Summary (Last 24 hours) at 01/02/12 0903 Last data filed at 01/02/12 0800  Gross per 24 hour  Intake 5800.72 ml  Output   4470 ml  Net 1330.72 ml   Physical Examination: General: Critically ill looking male. On ventilator  Neuro: Extremely agitated on WUA. Moving all 4s. Cough +. Corneals +, REports that he followed commands +   HEENT:  corneals +. No jaundice Neck:    Intubated Cardiovascular:  Normal heart sounds.+ Chest: Cleart to ausculatation Lungs:  Normal chest Abdomen:  Soft, non tennder Musculoskeletal:   Normal joints Skin:  intact Extremities:No edema,  no cyanosis, no clubbing.  Ventilator settings: Vent Mode:  [-] CPAP FiO2 (%):  [10 %-40.2 %] 40 % Set Rate:  [20 bmp] 20 bmp Vt Set:  [098 mL] 620 mL PEEP:  [5 cmH20] 5 cmH20 Pressure Support:  [10 cmH20] 10 cmH20 Plateau Pressure:  [18 cmH20-22 cmH20] 20 cmH20  Labs/Radiology: Please see A and P for full labs  Ct Head Wo Contrast  01/01/2012  *RADIOLOGY REPORT*  Clinical Data: Cardiac arrest  CT HEAD WITHOUT CONTRAST  Technique:  Contiguous axial images were obtained from the base of the skull through the vertex without contrast.  Comparison: Head CT 12/24/2011  Findings: No acute intracranial hemorrhage.  No focal mass lesion. No CT evidence of acute infarction.   No midline shift or mass effect.  No hydrocephalus.  Basilar cisterns are patent.  There is extensive fluid within the maxillary sinuses which is increased in the left maxillary sinus.  Small of fluid in the right mastoid air cells which is also new.  IMPRESSION: 1.  No acute intracranial findings. 2.  Sinusitis and fluid in the right mastoid air cells.  Original Report Authenticated By: Genevive Bi, M.D.   Dg Chest Port 1 View  01/02/2012  *RADIOLOGY REPORT*  Clinical Data: Assess endotracheal tube placement.  PORTABLE CHEST - 1 VIEW  Comparison: Chest x-ray 01/01/2012.  Findings: An endotracheal tube is in place with tip 4.3 cm above the carina. There is a right-sided internal jugular central venous catheter with tip terminating in the distal superior vena cava.  A nasogastric tube is seen extending into the stomach.  There continues to be extensive airspace consolidation throughout the right lung, with a moderate right-sided posterior layering pleural effusion.  The left lung appears relatively clear with some mild perihilar vascular prominence.  Heart size is normal.  Mediastinal contours are unremarkable.  IMPRESSION: 1.  Support apparatus, as above. 2.  Persistent right-sided pneumonia with moderate posterior layering right-sided pleural effusion. 3.  Mild pulmonary vascular congestion again noted.  Original Report Authenticated By: Florencia Reasons, M.D.   Dg Chest Port 1 View  01/01/2012  *RADIOLOGY REPORT*  Clinical Data: Pneumonia.  Check endotracheal tube position.  PORTABLE CHEST - 1 VIEW  Comparison: Portable chest 12/31/2011.  Findings: The endotracheal tube terminates at the level of the clavicles, well above the carina.  A right IJ line is stable in the distal SVC.  The NG tube courses off the inferior border of the film.  The heart size is normal.  A right upper lobe pneumonia persists. Moderate pulmonary vascular congestion is noted bilaterally. Generalized density in the right lung likely represents asymmetric edema.  IMPRESSION:  1.  Persistent right upper lobe pneumonia. 2.  The support apparatus is stable and in satisfactory position. 3.  Stable moderate pulmonary vascular congestion.  Original Report Authenticated By: Jamesetta Orleans. MATTERN, M.D.   Dg Chest Port 1 View  12/31/2011  *RADIOLOGY REPORT*  Clinical Data: Acute respiratory failure.  PORTABLE CHEST - 1 VIEW   Comparison: Yesterday  Findings: Endotracheal tube in satisfactory position.  Right jugular catheter tip in the superior vena cava.  Nasogastric tube extending into the stomach.  Normal sized heart.  Decreased airspace opacity in the right upper lung zone and left apical region.  Interval scattered airspace opacities elsewhere in both lungs.  Unremarkable bones.  IMPRESSION:  1.  Improving bilateral upper lobe pneumonia. 2.  Interval scattered airspace opacities in both lungs.  These could represent areas of alveolar  edema or developing pneumonia.  Original Report Authenticated By: Darrol Angel, M.D.    ASSESSMENT AND PLAN 46 year old male with PMH of cocaine, etoh and drug abuse presenting after being found down in a hotel room positive for cocaine and THC. Patient was noted to have vomitus into his airway upon intubation. RUL and RML infiltrate noted. Patient was started on Abx and hypothermia as well ARDS protocol were started.  RESPIRATORY Lab 01/02/12 0443 12/30/11 0335 12/29/11 0349 12/28/11 1130 12/28/11 0942  PHART 7.414 7.418 7.412 7.375 7.415  PCO2ART 44.6 51.4* 41.9 40.2 34.2*  PO2ART 86.3 91.0 104.0* 77.7* 121.0*  HCO3 28.0* 32.9* 26.6* 23.0 22.0  O2SAT 96.1 97.0 98.0 94.4 99.0  A:  Acute Respiratory Failure due to cardiac arrest and pneumonia and suspect chronic lung damage due to drug abuse and smoking. Pland: Full vent support specially that since peak pressure is so high from obstructive lung disease.  Titrate O2 down as tolerated, currently down to 40% with PaO2 of 80.  PS trials to begin today.  No extubation with mental status changes and high peak pressure, will need to speak with family about trach/peg.  Bronchodilators.  Diureses as ordered to address hyperkalemia and fluid overload (acknowledge that CVP is 4 this AM).  CARDIAC Lab 12/30/11 1100  TROPONINI --  PROBNP 2799.0*  A: S/P cardiac arrest on 12/24/2011.  S/p arctic sun. ECHO 4/22: Diffuse cardiomyopathy and  systolic CHF.  BNP high on 4/27 and red secretions from ET tube likely acute pulmonary edema P:  No interventions until extubated and mental status is better.  Lasix as ordered.  Other recommendations per cardiology.  NEUROLOGIC A:  S/p cardiac arrest. Due to coccaine and pneumonia.  Extreme agitation/delirium/encephalopathy.  Failed precedex 4/26.  This is likely a combination of anoxic injury as well as drug withdrawal.  EEG pending. P:  Continue sedation drips.  Haldol PRN.  Appreciate input from neurology.  CT noted and EEG pending.  Add precedex and attempt to decrease versed need to assess mental status.  VASCULAR/HEMODYNAMIC A:  Shock due to sepsis/pneumonia and cardiac arrest resolved but BP remains soft, not good enough to tolerate propofol. P: Monitor, MAP goal > 65  INFECTIOUS DISEASE  Lab 01/02/12 0430 01/01/12 1039 01/01/12 0200 12/31/11 0709 12/30/11 1002 12/30/11 0420  WBC 13.8* 12.6* 14.8* 15.4* -- 20.7*  PROCALCITON -- -- -- -- 6.51 --  LATICACIDVEN -- -- -- -- 0.9 --  A:  Hemophilus influenzae and aspiration pneumonia on 12/24/2011 date of admit P: Change zosyn to vanc and imipenem on 12/31/2011 and final cultures still pending at this point but WBC and fever curves are slightly better.  RENAL  Lab 01/02/12 0430 01/01/12 0200 12/31/11 1226 12/30/11 0420 12/29/11 0400  BUN 44* 38* 37* 34* 36*  CREATININE 0.88 0.93 0.89 0.95 0.94  A:  Normal renal function. P:  Additional lasix but no K as K today is 5.2.  F/U BMET.  ELECTROLYTES  Lab 01/02/12 0430 01/01/12 0200 12/31/11 1226 12/30/11 0420 12/29/11 0400 12/28/11 0545  NA 139 140 141 142 145 --  K 5.2* 4.5 -- -- -- --  CL 103 102 103 102 110 --  CO2 29 31 30 31 26  --  BUN 44* 38* 37* 34* 36* --  CREATININE 0.88 0.93 0.89 0.95 0.94 --  CALCIUM 8.8 8.7 8.6 8.2* 8.0* --  MG 2.2 1.9 -- 1.7 2.1 2.3  PHOS 4.8* 4.7* -- 4.4 2.8 1.3*  A: Hypomagnesemia Mg  of 1.9, will give additional dose today and rechek P:  recheck labs 4/28  HEMATOLOGIC  Lab 01/02/12 0430 01/01/12 1039 01/01/12 0519 01/01/12 0200 12/31/11 2100 12/31/11 0745 12/31/11 0709 12/31/11 0400 12/30/11 0420  HGB 10.6* 9.3* -- 10.8* -- -- 10.2* -- 10.9*  HCT 31.2* 27.3* -- 30.9* -- -- 29.7* -- 31.1*  PLT 215 143* -- 150 -- -- 85* -- 65*  INR -- -- -- -- -- -- -- -- --  APTT 81* -- 82* -- 75* 81* -- 80* --  A:   Anemia of critical illness.  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  Rx angiomax since 4/27. Duplex LE negative 4/27.  Upper ext dopplers with bilateral cephalic vein thrombosis. P: - PRBC for hgb < 7gm%.  - Agree with hold heparin since 4/26 and use scd's.  - Continune angiomax since 4/27 - stop when HIT returns negative but will need anticoagulation due to cephalic vein thrombosis.  Still pending as of 4/30.  GASTROINTESTINAL  Lab 01/02/12 0430  AST 34  ALT 75*  ALKPHOS 186*  BILITOT 0.5  PROT 5.8*  ALBUMIN 2.0*  INR --  AMYLASE --  LIPASE --  A: Shock liver versus chronic etoh abuse. P: Recheck LFT's.  ENDOCRINE  Lab 01/02/12 0744 01/02/12 0306 01/02/12 0018 01/01/12 1946 01/01/12 1626  GLUCAP 100* 83 98 103* 86  TSH -- -- -- -- --  CORTISOL -- -- -- -- --  A:  None P: sliding scale insulin  DERMATOLOGY A: Stage 1 on both big toes and heels. And Stage 2 on sacrum with black center - present since admit per RN P:  Wound care consult called 4/28. Continue air mattress overlay   Family updated bedside.  Family was willing to terminally extubate, however, given withdrawal issues and inability to come off sedation, I can not recommend that based on anoxic injury.  Recommended that we trach/peg and attempt to give some more time unless EEG/neurology recommendations are that anoxic injury is too profound for any meaningful recovery.  CC time 45 minutes.  Madeleine Fenn 01/02/2012, 9:03 AM

## 2012-01-02 NOTE — Progress Notes (Signed)
ANTICOAGULATION CONSULT NOTE - Follow Up Consult  Pharmacy Consult for Heparin Indication: Bilateral cephalic vein thrombosis  No Known Allergies  Patient Measurements: Height: 6' (182.9 cm) Weight: 156 lb 15.5 oz (71.2 kg) IBW/kg (Calculated) : 77.6  Heparin Dosing Weight: 71  Vital Signs: Temp: 100 F (37.8 C) (04/30 1900) Temp src: Core (Comment) (04/30 1900) BP: 110/49 mmHg (04/30 1900) Pulse Rate: 105  (04/30 1900)  Labs:  Basename 01/02/12 0430 01/01/12 1039 01/01/12 0519 01/01/12 0200 12/31/11 2100 12/31/11 1226  HGB 10.6* 9.3* -- -- -- --  HCT 31.2* 27.3* -- 30.9* -- --  PLT 215 143* -- 150 -- --  APTT 81* -- 82* -- 75* --  LABPROT -- -- -- -- -- --  INR -- -- -- -- -- --  HEPARINUNFRC -- -- -- -- -- --  CREATININE 0.88 -- -- 0.93 -- 0.89  CKTOTAL -- -- -- -- -- --  CKMB -- -- -- -- -- --  TROPONINI -- -- -- -- -- --   Estimated Creatinine Clearance: 105.6 ml/min (by C-G formula based on Cr of 0.88).   Assessment: 46 yo M with superficial thrombosis in cephalic vein bilaterally to transition to heparin from Angiomax, now that HIT has been ruled out.  Has not been on heparin infusion (was on heparin sq 4/22-4/27, then Angiomax).    Goal of Therapy:  Heparin level 0.3-0.7 units/ml   Plan:  1.  Begin heparin at 1200 units/hr 2.  Check 6 hour heparin level 3.  Daily heparin level, CBC  Rolland Porter, Pharm.D., BCPS Clinical Pharmacist Pager: [phone] 01/02/2012,8:01 PM

## 2012-01-02 NOTE — Progress Notes (Signed)
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 01/02/2012, 7:50 PM

## 2012-01-03 ENCOUNTER — Inpatient Hospital Stay (HOSPITAL_COMMUNITY): Payer: Medicaid Other

## 2012-01-03 DIAGNOSIS — I509 Heart failure, unspecified: Secondary | ICD-10-CM

## 2012-01-03 HISTORY — PX: CARDIAC CATHETERIZATION: SHX172

## 2012-01-03 LAB — GLUCOSE, CAPILLARY
Glucose-Capillary: 103 mg/dL — ABNORMAL HIGH (ref 70–99)
Glucose-Capillary: 106 mg/dL — ABNORMAL HIGH (ref 70–99)

## 2012-01-03 LAB — POCT I-STAT 3, ART BLOOD GAS (G3+)
O2 Saturation: 97 %
pCO2 arterial: 50.2 mmHg — ABNORMAL HIGH (ref 35.0–45.0)
pO2, Arterial: 91 mmHg (ref 80.0–100.0)

## 2012-01-03 LAB — MAGNESIUM: Magnesium: 1.9 mg/dL (ref 1.5–2.5)

## 2012-01-03 LAB — HEPARIN LEVEL (UNFRACTIONATED)
Heparin Unfractionated: <0.1 IU/mL — ABNORMAL LOW (ref 0.30–0.70)
Heparin Unfractionated: <0.1 IU/mL — ABNORMAL LOW (ref 0.30–0.70)

## 2012-01-03 LAB — BASIC METABOLIC PANEL
Chloride: 97 mEq/L (ref 96–112)
GFR calc Af Amer: >90 mL/min (ref >90)
Potassium: 4 mEq/L (ref 3.5–5.1)

## 2012-01-03 LAB — CBC
Platelets: 282 10*3/uL (ref 150–400)
RDW: 14.2 % (ref 11.5–15.5)
WBC: 13.7 10*3/uL — ABNORMAL HIGH (ref 4.0–10.5)

## 2012-01-03 MED ORDER — HEPARIN BOLUS VIA INFUSION
2000.0000 [IU] | Freq: Once | INTRAVENOUS | Status: AC
  Administered 2012-01-03: 2000 [IU] via INTRAVENOUS
  Filled 2012-01-03: qty 2000

## 2012-01-03 MED ORDER — POTASSIUM CHLORIDE 20 MEQ/15ML (10%) PO LIQD
40.0000 meq | Freq: Three times a day (TID) | ORAL | Status: AC
  Administered 2012-01-03 (×2): 40 meq
  Filled 2012-01-03 (×2): qty 30

## 2012-01-03 MED ORDER — MAGNESIUM SULFATE 40 MG/ML IJ SOLN
2.0000 g | Freq: Once | INTRAMUSCULAR | Status: AC
  Administered 2012-01-03: 2 g via INTRAVENOUS
  Filled 2012-01-03: qty 50

## 2012-01-03 MED ORDER — FUROSEMIDE 10 MG/ML IJ SOLN
40.0000 mg | Freq: Four times a day (QID) | INTRAMUSCULAR | Status: AC
  Administered 2012-01-03 (×3): 40 mg via INTRAVENOUS
  Filled 2012-01-03 (×3): qty 4

## 2012-01-03 NOTE — Progress Notes (Signed)
ANTICOAGULATION CONSULT NOTE - Follow Up Consult  Pharmacy Consult for Heparin Indication: Bilateral cephalic vein thrombosis  No Known Allergies  Patient Measurements: Height: 6' (182.9 cm) Weight: 158 lb 4.6 oz (71.8 kg) IBW/kg (Calculated) : 77.6  Heparin Dosing Weight: 71  Vital Signs: Temp: 99.7 F (37.6 C) (05/01 1900) BP: 104/63 mmHg (05/01 2020) Pulse Rate: 107  (05/01 2020)  Labs:  Basename 01/03/12 1800 01/03/12 1034 01/03/12 0300 01/02/12 0430 01/01/12 1039 01/01/12 0519 01/01/12 0200 12/31/11 2100  HGB -- -- 10.1* 10.6* -- -- -- --  HCT -- -- 29.7* 31.2* 27.3* -- -- --  PLT -- -- 282 215 143* -- -- --  APTT -- -- -- 81* -- 82* -- 75*  LABPROT -- -- -- -- -- -- -- --  INR -- -- -- -- -- -- -- --  HEPARINUNFRC <0.10* <0.10* <0.10* -- -- -- -- --  CREATININE -- -- 0.82 0.88 -- -- 0.93 --  CKTOTAL -- -- -- -- -- -- -- --  CKMB -- -- -- -- -- -- -- --  TROPONINI -- -- -- -- -- -- -- --   Estimated Creatinine Clearance: 114.3 ml/min (by C-G formula based on Cr of 0.82).   Assessment: 46 yo M with superficial thrombosis in cephalic vein bilaterally on IV heparin. Heparin level (< 0.1) x3  Trach planned for tomorrow, heparin to be stopped at 2400  Goal of Therapy:  Heparin level 0.3-0.7 units/ml   Plan:  1. Increase heparin to 2400 units / hr until midnight 2. Follow up after trach  Okey Regal, PharmD 01/03/2012,8:56 PM

## 2012-01-03 NOTE — Progress Notes (Signed)
Increased RR, agitation on 5/5. RT increased PS to 10. Pt didn't tolerate settings/weaning. Pt placed back on full support. RT will continue to monitor.

## 2012-01-03 NOTE — Progress Notes (Signed)
eLink Physician-Brief Progress Note Patient Name: PAULETTE LYNCH DOB: [birth year] MRN: [id number]  Date of Service  01/03/2012   HPI/Events of Note   Vent orders needed correction  eICU Interventions  doig well on peep 5 40%, place order   Intervention Category Minor Interventions: Communication with other healthcare providers and/or family  Nelda Bucks. 01/03/2012, 8:55 PM

## 2012-01-03 NOTE — Progress Notes (Signed)
ANTICOAGULATION CONSULT NOTE - Follow Up Consult  Pharmacy Consult for Heparin Indication: Bilateral cephalic vein thrombosis  No Known Allergies  Patient Measurements: Height: 6' (182.9 cm) Weight: 158 lb 4.6 oz (71.8 kg) IBW/kg (Calculated) : 77.6  Heparin Dosing Weight: 71  Vital Signs: Temp: 100.6 F (38.1 C) (05/01 1100) Temp src: Core (Comment) (05/01 0800) BP: 106/57 mmHg (05/01 1125) Pulse Rate: 103  (05/01 1125)  Labs:  Basename 01/03/12 1034 01/03/12 0300 01/02/12 0430 01/01/12 1039 01/01/12 0519 01/01/12 0200 12/31/11 2100  HGB -- 10.1* 10.6* -- -- -- --  HCT -- 29.7* 31.2* 27.3* -- -- --  PLT -- 282 215 143* -- -- --  APTT -- -- 81* -- 82* -- 75*  LABPROT -- -- -- -- -- -- --  INR -- -- -- -- -- -- --  HEPARINUNFRC <0.10* <0.10* -- -- -- -- --  CREATININE -- 0.82 0.88 -- -- 0.93 --  CKTOTAL -- -- -- -- -- -- --  CKMB -- -- -- -- -- -- --  TROPONINI -- -- -- -- -- -- --   Estimated Creatinine Clearance: 114.3 ml/min (by C-G formula based on Cr of 0.82).   Assessment: 46 yo M with superficial thrombosis in cephalic vein bilaterally on IV heparin. Heparin level (< 0.1) x2, below-goal on 1500 units/hr. Will rebolus and increase rate.  Goal of Therapy:  Heparin level 0.3-0.7 units/ml   Plan:  1. Heparin IV bolus of 2000 units x 1, then increase IV heparin to 1800 units/hr.  2. Heparin level in 6 hours.   Sheppard Coil PharmD 01/03/2012,11:46 AM

## 2012-01-03 NOTE — Plan of Care (Signed)
Problem: Phase I Progression Outcomes Goal: Tracheostomy by Vent Day 14 Outcome: Progressing Plan per Dr. Molli Knock is for trach tomorrow.

## 2012-01-03 NOTE — Progress Notes (Signed)
Name: Angel [name] DOB: 08/28/1966 MRN: [id number] PCP: No primary provider on file. ADMIT DATE: 12/24/2011 LOS: 10  PCCM PROGRESS NOTE  Brief Patient Profile:45 YO man with no past medical history. His family states that he abuses some perscription drugs and some occasional alcohol. Smokes 2 PPD history. Daughter tried to call him all day but he was sleeping which is unusual for him. His friend found him shaking violently around 8 PM and called 911 who intubated in the field for agonal breathing. He was unresponsive at the time. He was brought to the ED and placed on hypothermia protocol. No ischemic EKG changes on ED arrival. UDS positive for cocaine and THC.    Best Practice: DVT: SQ Heparin >> 4/27 (due to low plat), ScD 4/7 and Argotraban (mod prob HIT) 4/27 > SUP: protonix Nutrition: Tube feed Glycemic control:  Sedation/analgesia: Cont sedation (off precedex 4/26)  Lines / Drains: ETT 4/21>>>  CVC 4/21>>>   Micro: Blood 4/21>>>HAEMOPHILUS INFLUENZAE Urine 4/21>>>Neg  Sputum 4/21>>>GNR and GPC - NORMAL FLORA ....................... BC 4/27 NTD BAL 4/27 NTD  Urine 4/27 NTD  Abx Vancomycin 4/21>>>5/1 Primaxin 4/24   Lab 12/30/11 1002  LATICACIDVEN 0.9   Recent Results (from the past 240 hour(s))  CULTURE, BLOOD (ROUTINE X 2)     Status: Normal   Collection Time   12/24/11  8:11 PM      Component Value Range Status Comment   Specimen Description BLOOD RIGHT ARM   Final    Special Requests BOTTLES DRAWN AEROBIC ONLY 5CC   Final    Culture  Setup Time [id number]   Final    Culture NO GROWTH 5 DAYS   Final    Report Status 12/31/2011 FINAL   Final   URINE CULTURE     Status: Normal   Collection Time   12/24/11  8:28 PM      Component Value Range Status Comment   Specimen Description URINE, CATHETERIZED   Final    Special Requests NONE   Final    Culture  Setup Time [id number]   Final    Colony Count NO GROWTH   Final    Culture NO GROWTH   Final    Report  Status 12/26/2011 FINAL   Final   CULTURE, BLOOD (ROUTINE X 2)     Status: Normal (Preliminary result)   Collection Time   12/24/11  8:48 PM      Component Value Range Status Comment   Specimen Description BLOOD LEFT HAND   Final    Special Requests BOTTLES DRAWN AEROBIC AND ANAEROBIC 10CC EA   Final    Culture  Setup Time [id number]   Final    Culture     Final    Value: HAEMOPHILUS INFLUENZAE     Note: BETA LACTAMASE NEGATIVE CRITICAL RESULT CALLED TO, READ BACK BY AND VERIFIED WITH: KAMALA GOODE 1450 12/27/11 BY KRAWS FAXED TO BETTY ROGERS AT [facility] BY LYONK Referred to [facility] in [place], [place] Ser[place].     Note: Gram Stain Report Called to,Read Back By and Verified With: CATHERINE COMAN @0218  ON 12/26/11 BY MCLET   Report Status PENDING   Incomplete   MRSA PCR SCREENING     Status: Normal   Collection Time   12/24/11 10:01 PM      Component Value Range Status Comment   MRSA by PCR NEGATIVE  NEGATIVE  Final   CULTURE, RESPIRATORY     Status: Normal  Collection Time   12/24/11 11:13 PM      Component Value Range Status Comment   Specimen Description TRACHEAL ASPIRATE   Final    Special Requests NONE   Final    Gram Stain     Final    Value: FEW WBC PRESENT,BOTH PMN AND MONONUCLEAR     NO SQUAMOUS EPITHELIAL CELLS SEEN     FEW GRAM POSITIVE COCCI IN PAIRS     MODERATE GRAM NEGATIVE RODS   Culture Non-Pathogenic Oropharyngeal-type Flora Isolated.   Final    Report Status 12/26/2011 FINAL   Final   CULTURE, RESPIRATORY     Status: Normal   Collection Time   12/29/11 11:26 AM      Component Value Range Status Comment   Specimen Description OTHER   Final    Special Requests ETT SUCTION   Final    Gram Stain     Final    Value: FEW WBC PRESENT,BOTH PMN AND MONONUCLEAR     NO SQUAMOUS EPITHELIAL CELLS SEEN     NO ORGANISMS SEEN   Culture Non-Pathogenic Oropharyngeal-type Flora Isolated.   Final    Report Status 01/01/2012 FINAL   Final     CULTURE, BLOOD (ROUTINE X 2)     Status: Normal (Preliminary result)   Collection Time   12/30/11 11:08 AM      Component Value Range Status Comment   Specimen Description BLOOD ARM RIGHT   Final    Special Requests BOTTLES DRAWN AEROBIC AND ANAEROBIC 10CC EACH   Final    Culture  Setup Time [id number]   Final    Culture     Final    Value:        BLOOD CULTURE RECEIVED NO GROWTH TO DATE CULTURE WILL BE HELD FOR 5 DAYS BEFORE ISSUING A FINAL NEGATIVE REPORT   Report Status PENDING   Incomplete   CULTURE, BLOOD (ROUTINE X 2)     Status: Normal (Preliminary result)   Collection Time   12/30/11 11:20 AM      Component Value Range Status Comment   Specimen Description BLOOD ARM RIGHT   Final    Special Requests BOTTLES DRAWN AEROBIC ONLY 10CC    Final    Culture  Setup Time [id number]   Final    Culture     Final    Value:        BLOOD CULTURE RECEIVED NO GROWTH TO DATE CULTURE WILL BE HELD FOR 5 DAYS BEFORE ISSUING A FINAL NEGATIVE REPORT   Report Status PENDING   Incomplete   URINE CULTURE     Status: Normal   Collection Time   12/30/11 11:22 AM      Component Value Range Status Comment   Specimen Description URINE, CATHETERIZED   Final    Special Requests NONE   Final    Culture  Setup Time [id number]   Final    Colony Count NO GROWTH   Final    Culture NO GROWTH   Final    Report Status 12/31/2011 FINAL   Final   CULTURE, BAL-QUANTITATIVE     Status: Normal   Collection Time   12/30/11 12:51 PM      Component Value Range Status Comment   Specimen Description BRONCHIAL ALVEOLAR LAVAGE   Final    Special Requests NONE   Final    Gram Stain     Final    Value: MODERATE WBC PRESENT, PREDOMINANTLY PMN  NO SQUAMOUS EPITHELIAL CELLS SEEN     NO ORGANISMS SEEN   Colony Count 2,000 COLONIES/ML   Final    Culture Non-Pathogenic Oropharyngeal-type Flora Isolated.   Final    Report Status 01/02/2012 FINAL   Final   CULTURE, BAL-QUANTITATIVE     Status: Normal (Preliminary  result)   Collection Time   01/02/12  3:05 PM      Component Value Range Status Comment   Specimen Description BRONCHIAL ALVEOLAR LAVAGE LUNG RIGHT LOWER   Final    Special Requests NONE   Final    Gram Stain     Final    Value: RARE WBC PRESENT,BOTH PMN AND MONONUCLEAR     NO SQUAMOUS EPITHELIAL CELLS SEEN     NO ORGANISMS SEEN   Colony Count PENDING   Incomplete    Culture NO GROWTH   Final    Report Status PENDING   Incomplete     Antibiotics: Anti-infectives     Start     Dose/Rate Route Frequency Ordered Stop   01/01/12 1200   imipenem-cilastatin (PRIMAXIN) 500 mg in sodium chloride 0.9 % 100 mL IVPB        500 mg 200 mL/hr over 30 Minutes Intravenous 4 times per day 01/01/12 0943     12/31/11 1400   imipenem-cilastatin (PRIMAXIN) 500 mg in sodium chloride 0.9 % 100 mL IVPB  Status:  Discontinued        500 mg 200 mL/hr over 30 Minutes Intravenous 3 times per day 12/31/11 1121 01/01/12 0943   12/31/11 1300   vancomycin (VANCOCIN) 1,250 mg in sodium chloride 0.9 % 250 mL IVPB        1,250 mg 166.7 mL/hr over 90 Minutes Intravenous Every 12 hours 12/31/11 1120     12/25/11 0600   piperacillin-tazobactam (ZOSYN) IVPB 3.375 g  Status:  Discontinued        3.375 g 12.5 mL/hr over 240 Minutes Intravenous 3 times per day 12/24/11 2257 12/31/11 1028   12/24/11 2359   vancomycin (VANCOCIN) IVPB 1000 mg/200 mL premix  Status:  Discontinued        1,000 mg 200 mL/hr over 60 Minutes Intravenous Daily at bedtime 12/24/11 2301 12/29/11 1009   12/24/11 2045  piperacillin-tazobactam (ZOSYN) 3.375 g in dextrose 5 % 50 mL IVPB       3.375 g 100 mL/hr over 30 Minutes Intravenous To [facility] 12/24/11 2011 12/24/11 2206   12/24/11 2015   vancomycin (VANCOCIN) IVPB 1000 mg/200 mL premix  Status:  Discontinued        1,000 mg 200 mL/hr over 60 Minutes Intravenous  Once 12/24/11 2011 12/24/11 2301         BMET    Component Value Date/Time   NA 135 01/03/2012 0300   K 4.0  01/03/2012 0300   CL 97 01/03/2012 0300   CO2 29 01/03/2012 0300   GLUCOSE 96 01/03/2012 0300   BUN 45* 01/03/2012 0300   CREATININE 0.82 01/03/2012 0300   CALCIUM 8.8 01/03/2012 0300   GFRNONAA >90 01/03/2012 0300   GFRAA >90 01/03/2012 0300   CBC    Component Value Date/Time   WBC 13.7* 01/03/2012 0300   RBC 3.12* 01/03/2012 0300   HGB 10.1* 01/03/2012 0300   HCT 29.7* 01/03/2012 0300   PLT 282 01/03/2012 0300   MCV 95.2 01/03/2012 0300   MCH 32.4 01/03/2012 0300   MCHC 34.0 01/03/2012 0300   RDW 14.2 01/03/2012 0300   LYMPHSABS  0.8 12/24/2011 2009   MONOABS 0.1 12/24/2011 2009   EOSABS 0.0 12/24/2011 2009   BASOSABS 0.0 12/24/2011 2009   Tests / Events:  4/27 - HIT panel negative, off angiomax back to heparin. 4/28: agitated on wUA but followed commands.    Consults:   Subjective/Overnight/Interval History:  Extremely agitated on WUA. Moves all 4s. Intense Cough + Gag +. Corneals +. Per RN: followed commands x today to neurologist  Per family on 4/27: localized, had tear in eye at mention of youngest daughter  Copious pink secretions from ET tube +  Vital Signs: Temp:  [99.3 F (37.4 C)-100.8 F (38.2 C)] 100.6 F (38.1 C) (05/01 1000) Pulse Rate:  [78-105] 104  (05/01 1013) Resp:  [16-37] 25  (05/01 1013) BP: (94-151)/(48-82) 131/68 mmHg (05/01 1013) SpO2:  [88 %-100 %] 97 % (05/01 1013) FiO2 (%):  [39.9 %-100 %] 40 % (05/01 1013) Weight:  [71.8 kg (158 lb 4.6 oz)] 71.8 kg (158 lb 4.6 oz) (05/01 0600) I/O last 3 completed shifts: In: 8848.7 [P.O.:1185; I.V.:2891.7; NG/GT:3330; IV Piggyback:1442] Out: 6445 [Urine:6445]  Intake/Output Summary (Last 24 hours) at 01/03/12 1054 Last data filed at 01/03/12 1000  Gross per 24 hour  Intake 5932.29 ml  Output   4735 ml  Net 1197.29 ml   Physical Examination: General: Critically ill looking male. On ventilator  Neuro: Extremely agitated on WUA. Moving all 4s. Cough +. Corneals +, REports that he followed commands +   HEENT:  corneals +. No  jaundice Neck:    Intubated Cardiovascular:  Normal heart sounds.+ Chest: Cleart to ausculatation Lungs:  Normal chest Abdomen:  Soft, non tennder Musculoskeletal:   Normal joints Skin:  intact Extremities:No edema, no cyanosis, no clubbing.  Ventilator settings: Vent Mode:  [-] PRVC FiO2 (%):  [39.9 %-100 %] 40 % Set Rate:  [20 bmp] 20 bmp Vt Set:  [0 mL-620 mL] 620 mL PEEP:  [5 cmH20-10 cmH20] 5 cmH20 Pressure Support:  [5 cmH20-10 cmH20] 10 cmH20 Plateau Pressure:  [15 cmH20-29 cmH20] 19 cmH20  Labs/Radiology: Please see A and P for full labs  Ct Head Wo Contrast  01/01/2012  *RADIOLOGY REPORT*  Clinical Data: Cardiac arrest  CT HEAD WITHOUT CONTRAST  Technique:  Contiguous axial images were obtained from the base of the skull through the vertex without contrast.  Comparison: Head CT 12/24/2011  Findings: No acute intracranial hemorrhage.  No focal mass lesion. No CT evidence of acute infarction.   No midline shift or mass effect.  No hydrocephalus.  Basilar cisterns are patent.  There is extensive fluid within the maxillary sinuses which is increased in the left maxillary sinus.  Small of fluid in the right mastoid air cells which is also new.  IMPRESSION: 1.  No acute intracranial findings. 2.  Sinusitis and fluid in the right mastoid air cells.  Original Report Authenticated By: Genevive Bi, M.D.   Dg Chest Port 1 View  01/03/2012  *RADIOLOGY REPORT*  Clinical Data: Evaluate endotracheal tube position  PORTABLE CHEST - 1 VIEW  Comparison: Portable chest x-ray of 01/02/2012  Findings: The tip of the endotracheal tube is approximately 5.1 cm above the carina.  Patchy airspace disease primarily in the right upper, mid, and lower lung field is again noted although aeration of the left upper lung has improved in the interval.  Heart size is stable.  An NG tube is present.  Right IJ central venous line is unchanged in position.  IMPRESSION:  1.  Some improvement  in patchy airspace  disease particularly in the left upper lung field. 2.  Tip of endotracheal tube is 5.1 cm above the carina.  Original Report Authenticated By: Juline Patch, M.D.   Dg Chest Port 1 View  01/02/2012  *RADIOLOGY REPORT*  Clinical Data: Post bronchoscopy.  PORTABLE CHEST - 1 VIEW  Comparison: 01/02/2012  Findings: Endotracheal tube and NG tube are unchanged.  No pneumothorax.  Bilateral airspace disease again noted, diffuse on the right and increasing in the left upper lobe.  Heart is normal size.  IMPRESSION: No pneumothorax following bronchoscopy.  Endotracheal tube remains approximately 4 cm above the carina.  Stable right lung airspace disease.  Increasing left upper lobe opacity.  Original Report Authenticated By: Cyndie Chime, M.D.   Dg Chest Port 1 View  01/02/2012  *RADIOLOGY REPORT*  Clinical Data: Assess endotracheal tube placement.  PORTABLE CHEST - 1 VIEW  Comparison: Chest x-ray 01/01/2012.  Findings: An endotracheal tube is in place with tip 4.3 cm above the carina. There is a right-sided internal jugular central venous catheter with tip terminating in the distal superior vena cava.  A nasogastric tube is seen extending into the stomach.  There continues to be extensive airspace consolidation throughout the right lung, with a moderate right-sided posterior layering pleural effusion.  The left lung appears relatively clear with some mild perihilar vascular prominence.  Heart size is normal.  Mediastinal contours are unremarkable.  IMPRESSION: 1.  Support apparatus, as above. 2.  Persistent right-sided pneumonia with moderate posterior layering right-sided pleural effusion. 3.  Mild pulmonary vascular congestion again noted.  Original Report Authenticated By: Florencia Reasons, M.D.    ASSESSMENT AND PLAN 46 year old male with PMH of cocaine, etoh and drug abuse presenting after being found down in a hotel room positive for cocaine and THC. Patient was noted to have vomitus into his airway upon  intubation. RUL and RML infiltrate noted. Patient was started on Abx and hypothermia as well ARDS protocol were started.  Neuro: following commands now but requiring large amount of sedation due to history of significant drug abuse and alcoholism.  EEG shows slowness but likely multifactorial with metabolic, septic and anoxic encephalopathy. Plan: Neuro input appreciated.  Attempt to minimize sedation.  Cardiac: s/p cardiac arrest.  Shock resolved.  Off pressors. Plan: Begin diureses gently today.  Will monitor BP.  Cath once critical illness has resolved per cardiology.  Pulmonary: began weaning today but clearly fluid overloaded and very agitated with evidence of drug withdrawal. Plan: Begin PS trials today.  Gentle diureses.  F/U on BAL cultures from yesterday.  Plan on trach in AM.  GI: protein calorie malnutrition. Plan: Continue TF.  Hold heparin and TF after midnight for trach.  Renal:  No active issues, will begin diureses today.  ID: PNA, likely aspiration.  H flu in BAL. Plan: D/C vancomycin (completed 10 days)  Continue primaxin for 14 days.  F/U on BAL culture from 4/30.  Heme: DVT in bilateral upper ext and low platelet.  Neg HITT panel. Plan: Continue heparin.  Hold heparin after midnight for trach in AM.  Endo: ISS  Spoke with mother, exwife and three daughters today.  Patient is not weaning well.  Mental status remains questionable.  Today is day 12 on vent.  Will trach in AM.  CC time 45 minutes.  Elmira Olkowski 01/03/2012, 10:54 AM

## 2012-01-03 NOTE — Progress Notes (Signed)
ANTICOAGULATION CONSULT NOTE - Follow Up Consult  Pharmacy Consult for Heparin Indication: Bilateral cephalic vein thrombosis  No Known Allergies  Patient Measurements: Height: 6' (182.9 cm) Weight: 156 lb 15.5 oz (71.2 kg) IBW/kg (Calculated) : 77.6  Heparin Dosing Weight: 71  Vital Signs: Temp: 99.5 F (37.5 C) (05/01 0252) Temp src: Oral (05/01 0252) BP: 118/59 mmHg (05/01 0340) Pulse Rate: 85  (05/01 0340)  Labs:  Basename 01/03/12 0300 01/02/12 0430 01/01/12 1039 01/01/12 0519 01/01/12 0200 12/31/11 2100 12/31/11 1226  HGB 10.1* 10.6* -- -- -- -- --  HCT 29.7* 31.2* 27.3* -- -- -- --  PLT 282 215 143* -- -- -- --  APTT -- 81* -- 82* -- 75* --  LABPROT -- -- -- -- -- -- --  INR -- -- -- -- -- -- --  HEPARINUNFRC <0.10* -- -- -- -- -- --  CREATININE -- 0.88 -- -- 0.93 -- 0.89  CKTOTAL -- -- -- -- -- -- --  CKMB -- -- -- -- -- -- --  TROPONINI -- -- -- -- -- -- --   Estimated Creatinine Clearance: 105.6 ml/min (by C-G formula based on Cr of 0.88).   Assessment: 46 yo M with superficial thrombosis in cephalic vein bilaterally on IV heparin. Heparin level (< 0.1) is below-goal on 1200 units/hr.   Goal of Therapy:  Heparin level 0.3-0.7 units/ml   Plan:  1. Heparin IV bolus of 2000 units x 1, then increase IV heparin to 1500 units/hr.  2. Heparin level in 6 hours.   Lorre Munroe, PharmD 01/03/2012,4:25 AM

## 2012-01-03 NOTE — Progress Notes (Signed)
PROGRESS NOTE  Subjective:   Patient is a 46 y.o. male with a PMHx of drug abuse, who was admitted to [facility] on 12/24/2011 for evaluation of cardiac arrest.  The patient was apparently in a motel room doing drugs. His friend found him to be unconscious. EMS was called and they found him to be in pulseless electrical activity arrest. ACLS was started and the patient was resuscitated. The patient was brought to the closest emergency room and was admitted by the pulmonary critical care team.  Hypothermic cooling was initiated in the field with the infusion of iced saline. He was placed on the [place] protocol and has now been re-warmed.  He was more awake this am.  He responded to basic questions.   Objective:    Vital Signs:   Temp:  [99 F (37.2 C)-100.6 F (38.1 C)] 100.4 F (38 C) (05/01 0800) Pulse Rate:  [78-105] 98  (05/01 0800) Resp:  [16-28] 20  (05/01 0800) BP: (94-159)/(48-82) 106/55 mmHg (05/01 0800) SpO2:  [88 %-100 %] 95 % (05/01 0800) FiO2 (%):  [39.9 %-100 %] 39.9 % (05/01 0800) Weight:  [158 lb 4.6 oz (71.8 kg)] 158 lb 4.6 oz (71.8 kg) (05/01 0600)  Last BM Date: 01/02/12   24-hour weight change: Weight change: 1 lb 5.2 oz (0.6 kg)  Weight trends: Filed Weights   01/01/12 0500 01/02/12 0500 01/03/12 0600  Weight: 156 lb 4.9 oz (70.9 kg) 156 lb 15.5 oz (71.2 kg) 158 lb 4.6 oz (71.8 kg)    Intake/Output:  04/30 0701 - 05/01 0700 In: 5893.7 [P.O.:1185; I.V.:1901.7; NG/GT:1815; IV Piggyback:992] Out: 4920 [Urine:4920]     Physical Exam: BP 106/55  Pulse 98  Temp(Src) 100.4 F (38 C) (Core (Comment))  Resp 20  Ht 6' (1.829 m)  Wt 158 lb 4.6 oz (71.8 kg)  BMI 21.47 kg/m2  SpO2 95%  General: Vital signs reviewed and noted. sedated.  Head: Normocephalic, atraumatic.  Eyes: Eyes closed  Throat: Oropharynx nonerythematous, no exudate appreciated.   Neck: Supple. Normal carotids. No JVD  Lungs:  On the vent, rhonchi  Heart: Regular rate,  With normal   S1 S2. No murmurs, rubs, or gallops   Abdomen:  Soft, non-tender, non-distended with normoactive bowel sounds. No hepatomegaly. No rebound/guarding. No abdominal masses.  Extremities: No edema.  Distal pedal pulses are 2+ and equal bilaterally.  Neurologic: Sedated, agitated when off sedation  Psych: sedated    Labs: BMET:  Basename 01/03/12 0300 01/02/12 0430  NA 135 139  K 4.0 5.2*  CL 97 103  CO2 29 29  GLUCOSE 96 92  BUN 45* 44*  CREATININE 0.82 0.88  CALCIUM 8.8 8.8  MG 1.9 2.2  PHOS 4.3 4.8*    Liver function tests:  Basename 01/02/12 0430  AST 34  ALT 75*  ALKPHOS 186*  BILITOT 0.5  PROT 5.8*  ALBUMIN 2.0*   No results found for this basename: LIPASE:2,AMYLASE:2 in the last 72 hours  CBC:  Basename 01/03/12 0300 01/02/12 0430  WBC 13.7* 13.8*  NEUTROABS -- --  HGB 10.1* 10.6*  HCT 29.7* 31.2*  MCV 95.2 94.0  PLT 282 215    Cardiac Enzymes: No results found for this basename: CKTOTAL:4,CKMB:4,TROPONINI:4 in the last 72 hours  Coagulation Studies: No results found for this basename: LABPROT:5,INR:5 in the last 72 hours  Other:    Tele:  NSR  Medications:    Infusions:    . sodium chloride 5 mL/hr (01/03/12 0603)  .  dexmedetomidine (PRECEDEX) IV infusion Stopped (01/02/12 1300)  . feeding supplement (OXEPA) 1,000 mL (01/02/12 1105)  . fentaNYL infusion INTRAVENOUS 300 mcg/hr (01/03/12 0643)  . heparin 1,500 Units/hr (01/03/12 0440)  . midazolam (VERSED) infusion 6 mg/hr (01/03/12 0139)  . DISCONTD: bivalirudin (ANGIOMAX) infusion 0.5 mg/mL (Non-ACS indications) Stopped (01/02/12 2015)    Scheduled Medications:    . antiseptic oral rinse  15 mL Mouth Rinse QID  . chlorhexidine  15 mL Mouth Rinse BID  . enalaprilat  0.625 mg Intravenous Q6H  . etomidate      . feeding supplement  30 mL Per Tube QID  . folic acid  1 mg Oral Daily  . free water  250 mL Per Tube Q4H  . furosemide  40 mg Intravenous Q8H  . haloperidol lactate  5 mg  Intravenous Q6H  . heparin  2,000 Units Intravenous Once  . imipenem-cilastatin  500 mg Intravenous Q6H  . insulin aspart  0-9 Units Subcutaneous Q4H  . lidocaine (cardiac) 100 mg/33ml      . mulitivitamin  5 mL Oral Daily  . pantoprazole sodium  40 mg Per Tube Q1200  . QUEtiapine  25 mg Oral BID  . rocuronium      . sodium chloride  3 mL Intravenous Q12H  . succinylcholine      . thiamine  100 mg Oral Daily  . vancomycin  1,250 mg Intravenous Q12H  . DISCONTD: potassium chloride  40 mEq Per Tube BID  . DISCONTD: sodium chloride  3 mL Intravenous Q12H    Assessment/ Plan:    1. Cardiac: he has CHF with an EF of 25%.  He currently is sedated and is on the vent and has an abnormal EEG.   He is waking up some  He is off pressors Will consider further cardiac evaluation once he is off the vent and is not requiring sedation.  No further recs at this point.   I talked with mother.  Explained that we will wait and let him get off vent and wake up more before doing any further cardiac evaluation.   Length of Stay: 10  Vesta Mixer, Montez Hageman., MD, [facility] 01/03/2012, 8:22 AM

## 2012-01-04 ENCOUNTER — Inpatient Hospital Stay (HOSPITAL_COMMUNITY): Payer: Medicaid Other

## 2012-01-04 DIAGNOSIS — I429 Cardiomyopathy, unspecified: Secondary | ICD-10-CM

## 2012-01-04 LAB — CBC
HCT: 29.9 % — ABNORMAL LOW (ref 39.0–52.0)
Hemoglobin: 10.1 g/dL — ABNORMAL LOW (ref 13.0–17.0)
MCH: 31.8 pg (ref 26.0–34.0)
MCHC: 33.8 g/dL (ref 30.0–36.0)

## 2012-01-04 LAB — CULTURE, BAL-QUANTITATIVE W GRAM STAIN: Culture: NO GROWTH

## 2012-01-04 LAB — BASIC METABOLIC PANEL
CO2: 30 mEq/L (ref 19–32)
Calcium: 9 mg/dL (ref 8.4–10.5)
Creatinine, Ser: 0.87 mg/dL (ref 0.50–1.35)
Glucose, Bld: 89 mg/dL (ref 70–99)

## 2012-01-04 LAB — BLOOD GAS, ARTERIAL
MECHVT: 620 mL
PEEP: 5 cmH2O
Patient temperature: 100.4
pH, Arterial: 7.506 — ABNORMAL HIGH (ref 7.350–7.450)

## 2012-01-04 LAB — GLUCOSE, CAPILLARY
Glucose-Capillary: 92 mg/dL (ref 70–99)
Glucose-Capillary: 86 mg/dL (ref 70–99)

## 2012-01-04 MED ORDER — FUROSEMIDE 10 MG/ML IJ SOLN
20.0000 mg | Freq: Four times a day (QID) | INTRAMUSCULAR | Status: AC
  Administered 2012-01-04 – 2012-01-05 (×3): 20 mg via INTRAVENOUS
  Filled 2012-01-04 (×3): qty 2

## 2012-01-04 MED ORDER — POTASSIUM CHLORIDE 20 MEQ/15ML (10%) PO LIQD
40.0000 meq | Freq: Three times a day (TID) | ORAL | Status: AC
  Administered 2012-01-04: 40 meq
  Filled 2012-01-04 (×2): qty 30

## 2012-01-04 MED ORDER — ROCURONIUM BROMIDE 50 MG/5ML IV SOLN
INTRAVENOUS | Status: AC
  Administered 2012-01-04: 80 mg
  Filled 2012-01-04: qty 2

## 2012-01-04 MED ORDER — SUCCINYLCHOLINE CHLORIDE 20 MG/ML IJ SOLN
INTRAMUSCULAR | Status: AC
  Filled 2012-01-04: qty 10

## 2012-01-04 MED ORDER — LIDOCAINE HCL (CARDIAC) 20 MG/ML IV SOLN
INTRAVENOUS | Status: AC
  Filled 2012-01-04: qty 5

## 2012-01-04 MED ORDER — ETOMIDATE 2 MG/ML IV SOLN
INTRAVENOUS | Status: AC
  Administered 2012-01-04: 20 mg
  Filled 2012-01-04: qty 20

## 2012-01-04 MED ORDER — HEPARIN (PORCINE) IN NACL 100-0.45 UNIT/ML-% IJ SOLN
2350.0000 [IU]/h | INTRAMUSCULAR | Status: AC
  Administered 2012-01-04: 2200 [IU]/h via INTRAVENOUS
  Administered 2012-01-05 (×2): 2350 [IU]/h via INTRAVENOUS
  Filled 2012-01-04 (×3): qty 250

## 2012-01-04 NOTE — Procedures (Signed)
Bedside Tracheostomy Insertion Procedure Note   Patient Details:   Name: Angel [name] DOB: 1966-05-22 MRN: [id number]  Procedure: Tracheostomy  Pre Procedure Assessment: ET Tube Size: ET Tube secured at lip (cm): Bite block in place: Yes Breath Sounds: Rhonch  Post Procedure Assessment: BP 117/70  Pulse 99  Temp(Src) 99.2 F (37.3 C) (Oral)  Resp 22  Ht 6' (1.829 m)  Wt 148 lb 13 oz (67.5 kg)  BMI 20.18 kg/m2  SpO2 99% O2 sats: stable throughout Complications: No apparent complications Patient did tolerate procedure well Tracheostomy Brand:Shiley Tracheostomy Style:Cuffed Tracheostomy Size: 8 Tracheostomy Secured QMV:HQIONGE, velcro Tracheostomy Placement Confirmation:Trach cuff visualized and in place. cxr taken for placement    Azaiah Mello Ann 01/04/2012, 3:18 PM

## 2012-01-04 NOTE — Progress Notes (Signed)
Name: Angel [name] DOB: 1966-01-06 MRN: [id number] PCP: No primary provider on file. ADMIT DATE: 12/24/2011 LOS: 11  PCCM PROGRESS NOTE  Brief Patient Profile:46 YO man with no past medical history. His family states that he abuses some perscription drugs and some occasional alcohol. Smokes 2 PPD history. Daughter tried to call him all day but he was sleeping which is unusual for him. His friend found him shaking violently around 8 PM and called 911 who intubated in the field for agonal breathing. He was unresponsive at the time. He was brought to the ED and placed on hypothermia protocol. No ischemic EKG changes on ED arrival. UDS positive for cocaine and THC.    Best Practice: DVT: SQ Heparin >> 4/27 (due to low plat), ScD 4/7 and Argotraban (mod prob HIT) 4/27 > SUP: protonix Nutrition: Tube feed Glycemic control:  Sedation/analgesia: Cont sedation (off precedex 4/26)  Lines / Drains: ETT 4/21>>> 5/2 CVC 4/21>>> Janina Mayo Molli Knock) 5/2>>>  Micro: Blood 4/21>>>HAEMOPHILUS INFLUENZAE Urine 4/21>>>Neg  Sputum 4/21>>>GNR and GPC - NORMAL FLORA ....................... BC 4/27 NTD BAL 4/27 NTD  Urine 4/27 NTD BAL 4/30>>>NTD  Abx Vancomycin 4/21>>>5/1 Primaxin 4/24>>>   Lab 12/30/11 1002  LATICACIDVEN 0.9   Recent Results (from the past 240 hour(s))  CULTURE, RESPIRATORY     Status: Normal   Collection Time   12/29/11 11:26 AM      Component Value Range Status Comment   Specimen Description OTHER   Final    Special Requests ETT SUCTION   Final    Gram Stain     Final    Value: FEW WBC PRESENT,BOTH PMN AND MONONUCLEAR     NO SQUAMOUS EPITHELIAL CELLS SEEN     NO ORGANISMS SEEN   Culture Non-Pathogenic Oropharyngeal-type Flora Isolated.   Final    Report Status 01/01/2012 FINAL   Final   CULTURE, BLOOD (ROUTINE X 2)     Status: Normal (Preliminary result)   Collection Time   12/30/11 11:08 AM      Component Value Range Status Comment   Specimen Description BLOOD ARM RIGHT    Final    Special Requests BOTTLES DRAWN AEROBIC AND ANAEROBIC 10CC EACH   Final    Culture  Setup Time [id number]   Final    Culture     Final    Value:        BLOOD CULTURE RECEIVED NO GROWTH TO DATE CULTURE WILL BE HELD FOR 5 DAYS BEFORE ISSUING A FINAL NEGATIVE REPORT   Report Status PENDING   Incomplete   CULTURE, BLOOD (ROUTINE X 2)     Status: Normal (Preliminary result)   Collection Time   12/30/11 11:20 AM      Component Value Range Status Comment   Specimen Description BLOOD ARM RIGHT   Final    Special Requests BOTTLES DRAWN AEROBIC ONLY 10CC    Final    Culture  Setup Time [id number]   Final    Culture     Final    Value:        BLOOD CULTURE RECEIVED NO GROWTH TO DATE CULTURE WILL BE HELD FOR 5 DAYS BEFORE ISSUING A FINAL NEGATIVE REPORT   Report Status PENDING   Incomplete   URINE CULTURE     Status: Normal   Collection Time   12/30/11 11:22 AM      Component Value Range Status Comment   Specimen Description URINE, CATHETERIZED   Final    Special Requests NONE  Final    Culture  Setup Time [id number]   Final    Colony Count NO GROWTH   Final    Culture NO GROWTH   Final    Report Status 12/31/2011 FINAL   Final   CULTURE, BAL-QUANTITATIVE     Status: Normal   Collection Time   12/30/11 12:51 PM      Component Value Range Status Comment   Specimen Description BRONCHIAL ALVEOLAR LAVAGE   Final    Special Requests NONE   Final    Gram Stain     Final    Value: MODERATE WBC PRESENT, PREDOMINANTLY PMN     NO SQUAMOUS EPITHELIAL CELLS SEEN     NO ORGANISMS SEEN   Colony Count 2,000 COLONIES/ML   Final    Culture Non-Pathogenic Oropharyngeal-type Flora Isolated.   Final    Report Status 01/02/2012 FINAL   Final   AFB CULTURE WITH SMEAR     Status: Normal (Preliminary result)   Collection Time   01/02/12  3:05 PM      Component Value Range Status Comment   Specimen Description BRONCHIAL ALVEOLAR LAVAGE   Final    Special Requests ADDED 01/02/12 1552   Final     ACID FAST SMEAR NO ACID FAST BACILLI SEEN   Final    Culture     Final    Value: CULTURE WILL BE EXAMINED FOR 6 WEEKS BEFORE ISSUING A FINAL REPORT   Report Status PENDING   Incomplete   FUNGUS CULTURE W SMEAR     Status: Normal (Preliminary result)   Collection Time   01/02/12  3:05 PM      Component Value Range Status Comment   Specimen Description BRONCHIAL ALVEOLAR LAVAGE   Final    Special Requests ADDED 01/02/12 1552   Final    Fungal Smear NO YEAST OR FUNGAL ELEMENTS SEEN   Final    Culture CULTURE IN PROGRESS FOR FOUR WEEKS   Final    Report Status PENDING   Incomplete   CULTURE, BAL-QUANTITATIVE     Status: Normal   Collection Time   01/02/12  3:05 PM      Component Value Range Status Comment   Specimen Description BRONCHIAL ALVEOLAR LAVAGE LUNG RIGHT LOWER   Final    Special Requests NONE   Final    Gram Stain     Final    Value: RARE WBC PRESENT,BOTH PMN AND MONONUCLEAR     NO SQUAMOUS EPITHELIAL CELLS SEEN     NO ORGANISMS SEEN   Colony Count NO GROWTH   Final    Culture NO GROWTH 2 DAYS   Final    Report Status 01/04/2012 FINAL   Final     Antibiotics: Anti-infectives     Start     Dose/Rate Route Frequency Ordered Stop   01/01/12 1200   imipenem-cilastatin (PRIMAXIN) 500 mg in sodium chloride 0.9 % 100 mL IVPB        500 mg 200 mL/hr over 30 Minutes Intravenous 4 times per day 01/01/12 0943     12/31/11 1400   imipenem-cilastatin (PRIMAXIN) 500 mg in sodium chloride 0.9 % 100 mL IVPB  Status:  Discontinued        500 mg 200 mL/hr over 30 Minutes Intravenous 3 times per day 12/31/11 1121 01/01/12 0943   12/31/11 1300   vancomycin (VANCOCIN) 1,250 mg in sodium chloride 0.9 % 250 mL IVPB  Status:  Discontinued  1,250 mg 166.7 mL/hr over 90 Minutes Intravenous Every 12 hours 12/31/11 1120 01/03/12 1105   12/25/11 0600   piperacillin-tazobactam (ZOSYN) IVPB 3.375 g  Status:  Discontinued        3.375 g 12.5 mL/hr over 240 Minutes Intravenous 3 times per day  12/24/11 2257 12/31/11 1028   12/24/11 2359   vancomycin (VANCOCIN) IVPB 1000 mg/200 mL premix  Status:  Discontinued        1,000 mg 200 mL/hr over 60 Minutes Intravenous Daily at bedtime 12/24/11 2301 12/29/11 1009   12/24/11 2045  piperacillin-tazobactam (ZOSYN) 3.375 g in dextrose 5 % 50 mL IVPB       3.375 g 100 mL/hr over 30 Minutes Intravenous To [facility] 12/24/11 2011 12/24/11 2206   12/24/11 2015   vancomycin (VANCOCIN) IVPB 1000 mg/200 mL premix  Status:  Discontinued        1,000 mg 200 mL/hr over 60 Minutes Intravenous  Once 12/24/11 2011 12/24/11 2301         BMET    Component Value Date/Time   NA 141 01/04/2012 0411   K 3.8 01/04/2012 0411   CL 100 01/04/2012 0411   CO2 30 01/04/2012 0411   GLUCOSE 89 01/04/2012 0411   BUN 44* 01/04/2012 0411   CREATININE 0.87 01/04/2012 0411   CALCIUM 9.0 01/04/2012 0411   GFRNONAA >90 01/04/2012 0411   GFRAA >90 01/04/2012 0411   CBC    Component Value Date/Time   WBC 14.1* 01/04/2012 0411   RBC 3.18* 01/04/2012 0411   HGB 10.1* 01/04/2012 0411   HCT 29.9* 01/04/2012 0411   PLT 447* 01/04/2012 0411   MCV 94.0 01/04/2012 0411   MCH 31.8 01/04/2012 0411   MCHC 33.8 01/04/2012 0411   RDW 13.9 01/04/2012 0411   LYMPHSABS 0.8 12/24/2011 2009   MONOABS 0.1 12/24/2011 2009   EOSABS 0.0 12/24/2011 2009   BASOSABS 0.0 12/24/2011 2009   Tests / Events:  4/27 - HIT panel negative, off angiomax back to heparin. 4/28: agitated on wUA but followed commands.   Consults: Neuro (signed off 5/1).  Subjective/Overnight/Interval History:  Extremely agitated on WUA. Moves all 4s. Intense Cough + Gag +. Corneals +. Per RN: followed commands x today to neurologist  Per family on 4/27: localized, had tear in eye at mention of youngest daughter  Copious pink secretions from ET tube +  Vital Signs: Temp:  [98.8 F (37.1 C)-100.9 F (38.3 C)] 99.2 F (37.3 C) (05/02 0800) Pulse Rate:  [84-112] 91  (05/02 1200) Resp:  [19-42] 20  (05/02 1200) BP:  (91-173)/(49-96) 117/63 mmHg (05/02 1200) SpO2:  [92 %-100 %] 92 % (05/02 1200) FiO2 (%):  [29.9 %-40.5 %] 30 % (05/02 1200) Weight:  [67.5 kg (148 lb 13 oz)] 67.5 kg (148 lb 13 oz) (05/02 0300) I/O last 3 completed shifts: In: 6383.9 [I.V.:2423.9; Other:250; NG/GT:2910; IV Piggyback:800] Out: 8325 [Urine:8325]  Intake/Output Summary (Last 24 hours) at 01/04/12 1228 Last data filed at 01/04/12 1000  Gross per 24 hour  Intake 2756.31 ml  Output   6195 ml  Net -3438.69 ml   Physical Examination: General: Critically ill looking male. On ventilator and sedated.  Neuro: Extremely agitated on WUA. Moving all 4s. Cough +. Corneals +, REports that he followed commands +   HEENT:  corneals +. No jaundice Neck:    Intubated Cardiovascular:  Normal heart sounds.+ Chest: Cleart to ausculatation Lungs:  Normal chest Abdomen:  Soft, non tennder Musculoskeletal:  Normal joints Skin:  intact Extremities:No edema, no cyanosis, no clubbing.  Ventilator settings: Vent Mode:  [-] PRVC FiO2 (%):  [29.9 %-40.5 %] 30 % Set Rate:  [20 bmp] 20 bmp Vt Set:  [045 mL] 620 mL PEEP:  [5 cmH20] 5 cmH20 Plateau Pressure:  [16 cmH20-23 cmH20] 17 cmH20  Labs/Radiology: Please see A and P for full labs  Dg Chest Port 1 View  01/04/2012  *RADIOLOGY REPORT*  Clinical Data: ET tube placement  PORTABLE CHEST - 1 VIEW  Comparison: 01/03/2012  Findings: Endotracheal tube terminates at the thoracic inlet.  Stable right IJ venous catheter and enteric tube coursing below the diaphragm.  Multifocal patchy right lung opacities, suspicious for pneumonia or aspiration, grossly unchanged from the most recent study but improved from priors.  No pleural effusion or pneumothorax.  The heart is normal in size.  IMPRESSION: Endotracheal tube terminates at the thoracic inlet.  Multifocal patchy right lung opacities, suspicious for pneumonia or aspiration, grossly unchanged from the most recent study but improved from priors.   Original Report Authenticated By: Charline Bills, M.D.   Dg Chest Port 1 View  01/03/2012  *RADIOLOGY REPORT*  Clinical Data: Evaluate endotracheal tube position  PORTABLE CHEST - 1 VIEW  Comparison: Portable chest x-ray of 01/02/2012  Findings: The tip of the endotracheal tube is approximately 5.1 cm above the carina.  Patchy airspace disease primarily in the right upper, mid, and lower lung field is again noted although aeration of the left upper lung has improved in the interval.  Heart size is stable.  An NG tube is present.  Right IJ central venous line is unchanged in position.  IMPRESSION:  1.  Some improvement in patchy airspace disease particularly in the left upper lung field. 2.  Tip of endotracheal tube is 5.1 cm above the carina.  Original Report Authenticated By: Juline Patch, M.D.   Dg Chest Port 1 View  01/02/2012  *RADIOLOGY REPORT*  Clinical Data: Post bronchoscopy.  PORTABLE CHEST - 1 VIEW  Comparison: 01/02/2012  Findings: Endotracheal tube and NG tube are unchanged.  No pneumothorax.  Bilateral airspace disease again noted, diffuse on the right and increasing in the left upper lobe.  Heart is normal size.  IMPRESSION: No pneumothorax following bronchoscopy.  Endotracheal tube remains approximately 4 cm above the carina.  Stable right lung airspace disease.  Increasing left upper lobe opacity.  Original Report Authenticated By: Cyndie Chime, M.D.    ASSESSMENT AND PLAN 46 year old male with PMH of cocaine, etoh and drug abuse presenting after being found down in a hotel room positive for cocaine and THC. Patient was noted to have vomitus into his airway upon intubation. RUL and RML infiltrate noted. Patient was started on Abx and hypothermia as well ARDS protocol were started.  Neuro: following commands now but requiring large amount of sedation due to history of significant drug abuse and alcoholism.  EEG shows slowness but likely multifactorial with metabolic, septic and anoxic  encephalopathy. Plan: Neuro input appreciated.  Attempt to minimize sedation but will work on that more aggressively once trach is in.  Cardiac: s/p cardiac arrest.  Shock resolved.  Off pressors. Plan: Additional diureses gently today.  Will monitor BP.  Cath once critical illness has resolved per cardiology.  Pulmonary: began weaning today but clearly fluid overloaded and very agitated with evidence of drug withdrawal. Plan: Begin PS trials today.  Trach today.  F/U on BAL cultures from 4/30.  SBT in  AM.  Titrate O2 down.  GI: protein calorie malnutrition. Plan: Continue TF after trach and panda placement.  Renal:  No active issues, will begin diureses today.  ID: PNA, likely aspiration.  H flu in BAL. Plan: D/C vancomycin (completed 10 days)  Continue primaxin for 9/14 days.  F/U on BAL culture from 4/30.  Heme: DVT in bilateral upper ext and low platelet.  Neg HITT panel. Plan: Continue heparin.  Hold heparin after midnight for trach in AM.  Endo: ISS  Family updated bedside.  Will trach today and pending mental status will decide on PEG.  CC time 45 minutes.  Shterna Laramee 01/04/2012, 12:28 PM

## 2012-01-04 NOTE — Procedures (Signed)
Bedside Percutaneous Tracheostomy Placement  Consent obtained, anticoagulation held, TF held, area cleaned, lidocaine injected, skin incision in place followed by blunt dissection, hemostasis achieved, needle inserted followed by catheter and wire, visualized bronchoscopically, dilated then trach placed and suture.  Scope placed through tracheosotomy and was 6 cm above carina.  Minimal blood loss.  Patient tolerated the procedure well.  Alyson Reedy, M.D. [facility] Pulmonary/Critical Care Medicine. Pager: [phone]. After hours pager: [phone].

## 2012-01-04 NOTE — Progress Notes (Signed)
[name] Resident Note  Subjective:   Patient is a 46 y.o. male with a PMHx of drug abuse, who was admitted to [facility] on 12/24/2011 for evaluation of cardiac arrest.  The patient was apparently in a motel room doing drugs. His friend found him to be unconscious. EMS was called and they found him to be in pulseless electrical activity arrest. ACLS was started and the patient was resuscitated. The patient was brought to the closest emergency room and was admitted by the pulmonary critical care team.  Hypothermic cooling was initiated in the field with the infusion of iced saline. He was placed on the Artic Sun protocol and has now been re-warmed.  Patient intubated and sedated. Continue to be agitated if not sedated otherwise no events over night.   Objective:  Vital Signs in the last 24 hours: Filed Vitals:   01/04/12 0400 01/04/12 0500 01/04/12 0515 01/04/12 0600  BP: 111/74 93/61 119/68 99/56  Pulse: 94 90 90 94  Temp: 99.5 F (37.5 C) 99.9 F (37.7 C) 99.7 F (37.6 C) 99 F (37.2 C)  TempSrc: Core (Comment)     Resp: 20 20 20 20   Height:      Weight:      SpO2: 99% 98% 98% 98%   Intake/Output from previous day: 05/01 0701 - 05/02 0700 In: 3578.6 [I.V.:1448.6; NG/GT:1530; IV Piggyback:350] Out: 6440 [Urine:6440]   Physical Exam: Pt is intubated and sedated.   HEENT: normal Neck: No JVP  normal, carotids 2+without bruits Lungs: On the vent,rhonchi present CV: RRR without murmur or gallop Abd: soft, nontender, Positive BS,  Ext: no edema, distal pulses intact and equal Neuro: Sedated.   Lab Results:  Basename 01/04/12 0411 01/03/12 0300  WBC 14.1* 13.7*  HGB 10.1* 10.1*  PLT 447* 282    Basename 01/04/12 0411 01/03/12 0300  NA 141 135  K 3.8 4.0  CL 100 97  CO2 30 29  GLUCOSE 89 96  BUN 44* 45*  CREATININE 0.87 0.82   Basename 01/04/12 0343 01/03/12 2341 01/03/12 1957 01/03/12 1537 01/03/12 1212 01/03/12 0812  GLUCAP 91 108* 107* 102*  106* 106*    Tele:NSR  Scheduled Meds:   . antiseptic oral rinse  15 mL Mouth Rinse QID  . chlorhexidine  15 mL Mouth Rinse BID  . enalaprilat  0.625 mg Intravenous Q6H  . feeding supplement  30 mL Per Tube QID  . folic acid  1 mg Oral Daily  . free water  250 mL Per Tube Q4H  . furosemide  40 mg Intravenous Q6H  . haloperidol lactate  5 mg Intravenous Q6H  . heparin  2,000 Units Intravenous Once  . imipenem-cilastatin  500 mg Intravenous Q6H  . insulin aspart  0-9 Units Subcutaneous Q4H  . magnesium sulfate 1 - 4 g bolus IVPB  2 g Intravenous Once  . mulitivitamin  5 mL Oral Daily  . pantoprazole sodium  40 mg Per Tube Q1200  . potassium chloride  40 mEq Per Tube TID  . QUEtiapine  25 mg Oral BID  . sodium chloride  3 mL Intravenous Q12H  . thiamine  100 mg Oral Daily  . DISCONTD: vancomycin  1,250 mg Intravenous Q12H   Continuous Infusions:   . sodium chloride 5 mL/hr (01/03/12 0603)  . dexmedetomidine (PRECEDEX) IV infusion Stopped (01/02/12 1300)  . feeding supplement (OXEPA) 1,000 mL (01/03/12 1237)  . fentaNYL infusion INTRAVENOUS 275 mcg/hr (01/04/12 0422)  . heparin 2,200 Units/hr (  01/03/12 2137)  . midazolam (VERSED) infusion 8 mg/hr (01/04/12 0514)   PRN Meds:.sodium chloride, sodium chloride, acetaminophen (TYLENOL) oral liquid 160 mg/5 mL, fentaNYL, midazolam, sodium chloride  Imaging: Dg Chest Port 1 View  01/03/2012  *RADIOLOGY REPORT*  Clinical Data: Evaluate endotracheal tube position  PORTABLE CHEST - 1 VIEW  Comparison: Portable chest x-ray of 01/02/2012  Findings: The tip of the endotracheal tube is approximately 5.1 cm above the carina.  Patchy airspace disease primarily in the right upper, mid, and lower lung field is again noted although aeration of the left upper lung has improved in the interval.  Heart size is stable.  An NG tube is present.  Right IJ central venous line is unchanged in position.  IMPRESSION:  1.  Some improvement in patchy airspace  disease particularly in the left upper lung field. 2.  Tip of endotracheal tube is 5.1 cm above the carina.  Original Report Authenticated By: Juline Patch, M.D.   Dg Chest Port 1 View  01/02/2012  *RADIOLOGY REPORT*  Clinical Data: Post bronchoscopy.  PORTABLE CHEST - 1 VIEW  Comparison: 01/02/2012  Findings: Endotracheal tube and NG tube are unchanged.  No pneumothorax.  Bilateral airspace disease again noted, diffuse on the right and increasing in the left upper lobe.  Heart is normal size.  IMPRESSION: No pneumothorax following bronchoscopy.  Endotracheal tube remains approximately 4 cm above the carina.  Stable right lung airspace disease.  Increasing left upper lobe opacity.  Original Report Authenticated By: Cyndie Chime, M.D.   Assessment/Plan:   Cardiac:s/p PEA arrest and Artic Sun protocol 4/21 .   CHF with an EF of 25%. He currently on  Vent. He is off pressors . Plan for Tach today.  Will consider further cardiac evaluation once he is off the vent and is not requiring sedation.  No further recs at this point.   Patient history and plan of care reviewed with attending, Dr.Beecher Furio  Length of Stay: [name], M.D. 01/04/2012, 7:18 AM  History reviewed with the patient, no changes to be made. Awake.  No distress The patient exam reveals Lungs transmitted upper airway sounds.  COR RRR, ABD Positive bowel sounds, no rebound no guarding  Ext:  No edema.  All available labs, radiology testing, previous records reviewed. Agree with documented assessment and plan.  The patient is tolerating ACE inhibitor.  Probably start low dose beta blocker in the am.   Rollene Rotunda  8:20 AM 07/21/2011

## 2012-01-04 NOTE — Progress Notes (Signed)
Clinical Social Work Department BRIEF PSYCHOSOCIAL ASSESSMENT 01/04/2012  Patient:  Angel [name], Angel [name]     Account Number:  [id number]     Admit date:  12/24/2011  Clinical Social Worker:  Margaree Mackintosh  Date/Time:  01/04/2012 03:42 PM  Referred by:  Physician  Date Referred:  01/03/2012  Other Referral:   Interview type:  Family Other interview type:   Mother at bedside.    PSYCHOSOCIAL DATA Living Status:  ALONE Admitted from facility:   Level of care:   Primary support name:  [phone] Primary support relationship to patient:  PARENT Degree of support available:   Adequate.    CURRENT CONCERNS Current Concerns  Adjustment to Illness  Post-Acute Placement   Other Concerns:    SOCIAL WORK ASSESSMENT / PLAN Clinical Social Worker met with pt's mom, pt currently vented and sedated.  CSW introduced self and explained role.  Mother shared that pt has three children, the oldest being 23 years old.  Mother reports that the family is making decisions together for pt.  Mother inquired about rehab for pt at dc.  CSW spent time explaining post dc options for pt.  All questions/concerns were addressed. Mother to call financial counselors to begin [facility] [place] application.  CSW provided [place] Medicaid Application to mother.  CSW to continue to follow and assist as needed.   Assessment/plan status:  Information/Referral to [facility] Other assessment/ plan:   Information/referral to community resources:   [facility]  SNF  MEDICAID    PATIENT'S/FAMILY'S RESPONSE TO PLAN OF CARE: Pt currently unable to participate in assessment.  Mother appeared receptive to CSW intervention.        Angelia Mould, MSW, [place] [phone]

## 2012-01-04 NOTE — Procedures (Signed)
Bronchoscopy Procedure Note Angel [name] [id number] [birth year]  Procedure: Bronchoscopy Indications: Diagnostic evaluation of the airways to assist with percutaneous tracheostomy insertion  Procedure Details Consent: Risks of procedure as well as the alternatives and risks of each were explained to the (patient/caregiver).  Consent for procedure obtained. Time Out: Verified patient identification, verified procedure, site/side was marked, verified correct patient position, special equipment/implants available, medications/allergies/relevent history reviewed, required imaging and test results available.  Performed  In preparation for procedure, patient was given 100% FiO2. Sedation: Benzodiazepines, Muscle relaxants and Etomidate  Airway entered and the following bronchi were examined: RUL, RML, RLL, LUL and LLL.   Procedures performed: Brushings performed Bronchoscope removed.    Evaluation Hemodynamic Status: BP stable throughout; O2 sats: stable throughout Patient's Current Condition: stable Specimens:  None Complications: No apparent complications Patient did tolerate procedure well.  Patient with cardiac arrest, and prolonged need for ventilatory support.  Bronchoscope entered through ETT.  Good visualization of airway.  ETT withdrawn to 17 cm at lip.  Good visualization of tracheostomy entering airway w/o evidence for trauma.  Bronchoscope then entered through tracheostomy tube.  Tracheostomy in good position above carina w/o evidence for significant bleeding.  Post-procedure chest xray pending.  Coralyn Helling, MD 01/04/2012, 2:22 PM

## 2012-01-04 NOTE — Progress Notes (Signed)
ANTICOAGULATION & ANTIBIOTIC CONSULT NOTE - Follow Up Consult  Pharmacy Consult for Heparin/  Primaxin Indication:  Bilateral cephalic vein thrombosis/  Aspiration pneumonia coverage  No Known Allergies  Patient Measurements: Height: 6' (182.9 cm) Weight: 148 lb 13 oz (67.5 kg) IBW/kg (Calculated) : 77.6  Heparin Dosing Weight: 67.5 kg  Vital Signs: Temp: 99.2 F (37.3 C) (05/02 0800) Temp src: Oral (05/02 0800) BP: 117/70 mmHg (05/02 1445) Pulse Rate: 99  (05/02 1445)  Labs:  Basename 01/04/12 0411 01/03/12 1800 01/03/12 1034 01/03/12 0300 01/02/12 0430  HGB 10.1* -- -- 10.1* --  HCT 29.9* -- -- 29.7* 31.2*  PLT 447* -- -- 282 215  APTT -- -- -- -- 81*  LABPROT -- -- -- -- --  INR -- -- -- -- --  HEPARINUNFRC <0.10* <0.10* <0.10* -- --  CREATININE 0.87 -- -- 0.82 0.88  CKTOTAL -- -- -- -- --  CKMB -- -- -- -- --  TROPONINI -- -- -- -- --   Estimated Creatinine Clearance: 101.3 ml/min (by C-G formula based on Cr of 0.87).  Assessment:   Heparin drip was at 2200 units/hr until ~midnight last night, when it was turned off for trach today.  Heparin level this am was <0.1, but drip had been off for ~4 hrs. Prior levels had also been <0.1 x3, and heparin infusion rate successively increased.  Heparin to resume at 9pm tonight, 6 hours post-trach.    Day #4 Primaxin.  BAL culture 4/30 - no growth    Tmax 100.9, WBC 14.1      Goal of Therapy:  Heparin level 0.3-0.7 units/ml Appropriate Primaxin dose for renal function & infection   Plan:     Will resume heparin at 9pm at prior rate of 2200 units/hr.  Heparin level 6 hrs later.       Continue Primaxin 500 mg IV q6hrs.  Dennie Fetters, RPh Pager: [phone] 01/04/2012,2:57 PM

## 2012-01-05 ENCOUNTER — Inpatient Hospital Stay (HOSPITAL_COMMUNITY): Payer: Medicaid Other

## 2012-01-05 LAB — CBC
HCT: 31.7 % — ABNORMAL LOW (ref 39.0–52.0)
Hemoglobin: 10.8 g/dL — ABNORMAL LOW (ref 13.0–17.0)
MCH: 32.5 pg (ref 26.0–34.0)
MCHC: 34.1 g/dL (ref 30.0–36.0)

## 2012-01-05 LAB — HEPARIN LEVEL (UNFRACTIONATED): Heparin Unfractionated: 0.37 IU/mL (ref 0.30–0.70)

## 2012-01-05 LAB — BLOOD GAS, ARTERIAL
Acid-Base Excess: 3 mmol/L — ABNORMAL HIGH (ref 0.0–2.0)
FIO2: 0.3 %
MECHVT: 620 mL
RATE: 20 resp/min
pCO2 arterial: 37.5 mmHg (ref 35.0–45.0)

## 2012-01-05 LAB — CULTURE, BLOOD (ROUTINE X 2)
Culture  Setup Time: 201304271719
Culture: NO GROWTH

## 2012-01-05 LAB — GLUCOSE, CAPILLARY: Glucose-Capillary: 101 mg/dL — ABNORMAL HIGH (ref 70–99)

## 2012-01-05 LAB — BASIC METABOLIC PANEL
BUN: 45 mg/dL — ABNORMAL HIGH (ref 6–23)
Calcium: 9.2 mg/dL (ref 8.4–10.5)
GFR calc non Af Amer: >90 mL/min (ref >90)
Glucose, Bld: 88 mg/dL (ref 70–99)
Potassium: 4.2 mEq/L (ref 3.5–5.1)

## 2012-01-05 NOTE — Progress Notes (Signed)
ANTICOAGULATION CONSULT NOTE - Follow Up Consult  Pharmacy Consult for Heparin Indication:  Bilateral cephalic vein thrombosis  No Known Allergies  Patient Measurements: Height: 6' (182.9 cm) Weight: 148 lb 13 oz (67.5 kg) IBW/kg (Calculated) : 77.6  Heparin Dosing Weight: 67.5 kg  Vital Signs: Temp: 98.6 F (37 C) (05/03 0000) Temp src: Oral (05/03 0000) BP: 109/68 mmHg (05/03 0300) Pulse Rate: 94  (05/03 0300)  Labs:  Basename 01/05/12 0254 01/04/12 0411 01/03/12 1800 01/03/12 0300 01/02/12 0430  HGB 10.8* 10.1* -- -- --  HCT 31.7* 29.9* -- 29.7* --  PLT 551* 447* -- 282 --  APTT -- -- -- -- 81*  LABPROT -- -- -- -- --  INR -- -- -- -- --  HEPARINUNFRC 0.21* <0.10* <0.10* -- --  CREATININE 0.80 0.87 -- 0.82 --  CKTOTAL -- -- -- -- --  CKMB -- -- -- -- --  TROPONINI -- -- -- -- --   Estimated Creatinine Clearance: 109 ml/min (by C-G formula based on Cr of 0.8).  Assessment: 46 yo male with cephalic vein thrombosis, s/p trach yesterday, for Heparin      Goal of Therapy:  Heparin level 0.3-0.7 units/ml    Plan:  Increase Heparin 2350 units/hr Check heparin level in 8 hours.  Eddie Candle 01/05/2012 4:00 AM

## 2012-01-05 NOTE — Progress Notes (Addendum)
Name: Angel [name] DOB: 1966/02/07 MRN: [id number] PCP: No primary provider on file. ADMIT DATE: 12/24/2011 LOS: 12  PCCM PROGRESS NOTE  Brief Patient Profile:46 YO man with no past medical history. His family states that he abuses some perscription drugs and some occasional alcohol. Smokes 2 PPD history. Daughter tried to call him all day but he was sleeping which is unusual for him. His friend found him shaking violently around 8 PM and called 911 who intubated in the field for agonal breathing. He was unresponsive at the time. He was brought to the ED and placed on hypothermia protocol. No ischemic EKG changes on ED arrival. UDS positive for cocaine and THC.    Best Practice: DVT: SQ Heparin >> 4/27 (due to low plat), ScD 4/7 and Argotraban (mod prob HIT) 4/27 > SUP: protonix Nutrition: Tube feed Glycemic control:  Sedation/analgesia: Cont sedation (off precedex 4/26)  Lines / Drains: ETT 4/21>>> 5/2 CVC 4/21>>> Janina Mayo Molli Knock) 5/2>>>  Micro: Blood 4/21>>>HAEMOPHILUS INFLUENZAE Urine 4/21>>>Neg  Sputum 4/21>>>GNR and GPC - NORMAL FLORA ....................... BC 4/27 NTD BAL 4/27 NTD  Urine 4/27 NTD BAL 4/30>>>NTD  Abx Vancomycin 4/21>>>5/1 Primaxin 4/24>>>   Lab 12/30/11 1002  LATICACIDVEN 0.9   Recent Results (from the past 240 hour(s))  CULTURE, RESPIRATORY     Status: Normal   Collection Time   12/29/11 11:26 AM      Component Value Range Status Comment   Specimen Description OTHER   Final    Special Requests ETT SUCTION   Final    Gram Stain     Final    Value: FEW WBC PRESENT,BOTH PMN AND MONONUCLEAR     NO SQUAMOUS EPITHELIAL CELLS SEEN     NO ORGANISMS SEEN   Culture Non-Pathogenic Oropharyngeal-type Flora Isolated.   Final    Report Status 01/01/2012 FINAL   Final   CULTURE, BLOOD (ROUTINE X 2)     Status: Normal   Collection Time   12/30/11 11:08 AM      Component Value Range Status Comment   Specimen Description BLOOD ARM RIGHT   Final    Special  Requests BOTTLES DRAWN AEROBIC AND ANAEROBIC [facility]   Final    Culture  Setup Time [id number]   Final    Culture NO GROWTH 5 DAYS   Final    Report Status 01/05/2012 FINAL   Final   CULTURE, BLOOD (ROUTINE X 2)     Status: Normal   Collection Time   12/30/11 11:20 AM      Component Value Range Status Comment   Specimen Description BLOOD ARM RIGHT   Final    Special Requests BOTTLES DRAWN AEROBIC ONLY 10CC    Final    Culture  Setup Time [id number]   Final    Culture NO GROWTH 5 DAYS   Final    Report Status 01/05/2012 FINAL   Final   URINE CULTURE     Status: Normal   Collection Time   12/30/11 11:22 AM      Component Value Range Status Comment   Specimen Description URINE, CATHETERIZED   Final    Special Requests NONE   Final    Culture  Setup Time [id number]   Final    Colony Count NO GROWTH   Final    Culture NO GROWTH   Final    Report Status 12/31/2011 FINAL   Final   CULTURE, BAL-QUANTITATIVE     Status: Normal   Collection Time  12/30/11 12:51 PM      Component Value Range Status Comment   Specimen Description BRONCHIAL ALVEOLAR LAVAGE   Final    Special Requests NONE   Final    Gram Stain     Final    Value: MODERATE WBC PRESENT, PREDOMINANTLY PMN     NO SQUAMOUS EPITHELIAL CELLS SEEN     NO ORGANISMS SEEN   Colony Count 2,000 COLONIES/ML   Final    Culture Non-Pathogenic Oropharyngeal-type Flora Isolated.   Final    Report Status 01/02/2012 FINAL   Final   AFB CULTURE WITH SMEAR     Status: Normal (Preliminary result)   Collection Time   01/02/12  3:05 PM      Component Value Range Status Comment   Specimen Description BRONCHIAL ALVEOLAR LAVAGE   Final    Special Requests ADDED 01/02/12 1552   Final    ACID FAST SMEAR NO ACID FAST BACILLI SEEN   Final    Culture     Final    Value: CULTURE WILL BE EXAMINED FOR 6 WEEKS BEFORE ISSUING A FINAL REPORT   Report Status PENDING   Incomplete   FUNGUS CULTURE W SMEAR     Status: Normal (Preliminary result)    Collection Time   01/02/12  3:05 PM      Component Value Range Status Comment   Specimen Description BRONCHIAL ALVEOLAR LAVAGE   Final    Special Requests ADDED 01/02/12 1552   Final    Fungal Smear NO YEAST OR FUNGAL ELEMENTS SEEN   Final    Culture CULTURE IN PROGRESS FOR FOUR WEEKS   Final    Report Status PENDING   Incomplete   CULTURE, BAL-QUANTITATIVE     Status: Normal   Collection Time   01/02/12  3:05 PM      Component Value Range Status Comment   Specimen Description BRONCHIAL ALVEOLAR LAVAGE LUNG RIGHT LOWER   Final    Special Requests NONE   Final    Gram Stain     Final    Value: RARE WBC PRESENT,BOTH PMN AND MONONUCLEAR     NO SQUAMOUS EPITHELIAL CELLS SEEN     NO ORGANISMS SEEN   Colony Count NO GROWTH   Final    Culture NO GROWTH 2 DAYS   Final    Report Status 01/04/2012 FINAL   Final     Antibiotics: Anti-infectives     Start     Dose/Rate Route Frequency Ordered Stop   01/01/12 1200   imipenem-cilastatin (PRIMAXIN) 500 mg in sodium chloride 0.9 % 100 mL IVPB        500 mg 200 mL/hr over 30 Minutes Intravenous 4 times per day 01/01/12 0943     12/31/11 1400   imipenem-cilastatin (PRIMAXIN) 500 mg in sodium chloride 0.9 % 100 mL IVPB  Status:  Discontinued        500 mg 200 mL/hr over 30 Minutes Intravenous 3 times per day 12/31/11 1121 01/01/12 0943   12/31/11 1300   vancomycin (VANCOCIN) 1,250 mg in sodium chloride 0.9 % 250 mL IVPB  Status:  Discontinued        1,250 mg 166.7 mL/hr over 90 Minutes Intravenous Every 12 hours 12/31/11 1120 01/03/12 1105   12/25/11 0600   piperacillin-tazobactam (ZOSYN) IVPB 3.375 g  Status:  Discontinued        3.375 g 12.5 mL/hr over 240 Minutes Intravenous 3 times per day 12/24/11 2257 12/31/11 1028   12/24/11  2359   vancomycin (VANCOCIN) IVPB 1000 mg/200 mL premix  Status:  Discontinued        1,000 mg 200 mL/hr over 60 Minutes Intravenous Daily at bedtime 12/24/11 2301 12/29/11 1009   12/24/11 2045   piperacillin-tazobactam (ZOSYN) 3.375 g in dextrose 5 % 50 mL IVPB       3.375 g 100 mL/hr over 30 Minutes Intravenous [facility] 12/24/11 2011 12/24/11 2206   12/24/11 2015   vancomycin (VANCOCIN) IVPB 1000 mg/200 mL premix  Status:  Discontinued        1,000 mg 200 mL/hr over 60 Minutes Intravenous  Once 12/24/11 2011 12/24/11 2301         BMET    Component Value Date/Time   NA 141 01/05/2012 0254   K 4.2 01/05/2012 0254   CL 103 01/05/2012 0254   CO2 27 01/05/2012 0254   GLUCOSE 88 01/05/2012 0254   BUN 45* 01/05/2012 0254   CREATININE 0.80 01/05/2012 0254   CALCIUM 9.2 01/05/2012 0254   GFRNONAA >90 01/05/2012 0254   GFRAA >90 01/05/2012 0254   CBC    Component Value Date/Time   WBC 13.5* 01/05/2012 0254   RBC 3.32* 01/05/2012 0254   HGB 10.8* 01/05/2012 0254   HCT 31.7* 01/05/2012 0254   PLT 551* 01/05/2012 0254   MCV 95.5 01/05/2012 0254   MCH 32.5 01/05/2012 0254   MCHC 34.1 01/05/2012 0254   RDW 13.9 01/05/2012 0254   LYMPHSABS 0.8 12/24/2011 2009   MONOABS 0.1 12/24/2011 2009   EOSABS 0.0 12/24/2011 2009   BASOSABS 0.0 12/24/2011 2009   Tests / Events:  4/27 - HIT panel negative, off angiomax back to heparin. 4/28: agitated on wUA but followed commands.   Consults: Neuro (signed off 5/1).  Subjective/Overnight/Interval History:  Extremely agitated on WUA. Moves all 4s. Intense Cough + Gag +. Corneals +. Per RN: followed commands x today to neurologist  Per family on 4/27: localized, had tear in eye at mention of youngest daughter  Copious pink secretions from ET tube +  Vital Signs: Temp:  [98.2 F (36.8 C)-99.2 F (37.3 C)] 98.8 F (37.1 C) (05/03 0800) Pulse Rate:  [78-109] 98  (05/03 1100) Resp:  [19-41] 41  (05/03 1100) BP: (90-149)/(46-85) 112/64 mmHg (05/03 1100) SpO2:  [92 %-100 %] 93 % (05/03 1100) FiO2 (%):  [30 %-100 %] 30 % (05/03 1000) Weight:  [61.8 kg (136 lb 3.9 oz)] 61.8 kg (136 lb 3.9 oz) (05/03 0500) I/O last 3 completed shifts: In: 4570.1  [I.V.:2391.1; Other:250; NG/GT:1425; IV Piggyback:504] Out: 6850 [Urine:6850]  Intake/Output Summary (Last 24 hours) at 01/05/12 1138 Last data filed at 01/05/12 1000  Gross per 24 hour  Intake 3353.3 ml  Output   4885 ml  Net -1531.7 ml   Physical Examination: General: Critically ill looking male. On TC.  Neuro: Extremely agitated on WUA. Moving all 4s. Cough +. Corneals +, REports that he followed commands +   HEENT:  corneals +. No jaundice Neck: Trach and clearn Cardiovascular:  Normal heart sounds.+ Chest: Cleart to ausculatation Lungs:  Normal chest Abdomen:  Soft, non tennder Musculoskeletal:   Normal joints Skin:  intact Extremities:No edema, no cyanosis, no clubbing.  Ventilator settings: Vent Mode:  [-] PSV FiO2 (%):  [30 %-100 %] 30 % Set Rate:  [20 bmp] 20 bmp Vt Set:  [620 mL] 620 mL PEEP:  [5 cmH20] 5 cmH20 Pressure Support:  [5 cmH20] 5 cmH20 Plateau Pressure:  [16 cmH20-17  cmH20] 16 cmH20  Labs/Radiology: Please see A and P for full labs  Dg Chest Port 1 View  01/05/2012  *RADIOLOGY REPORT*  Clinical Data: Trach placement  PORTABLE CHEST - 1 VIEW  Comparison: 01/04/2012  Findings: Stable tracheostomy at the thoracic inlet.  Weighted feeding tube terminating in the gastric cardia.  Stable right IJ venous catheter.  Patchy opacities in the right mid/upper lung lungs, possibly reflecting pneumonia or aspiration, improved.  No pleural effusion or pneumothorax.  The heart is normal in size.  IMPRESSION: Patchy opacities in the right mid/upper lungs, possibly reflecting pneumonia or aspiration, improved.  Stable tracheostomy at the thoracic inlet.  Stable support apparatus.  Original Report Authenticated By: Charline Bills, M.D.   Chest Portable 1 View To Assess Tube Placement And Rule-out Pneumothorax  01/04/2012  *RADIOLOGY REPORT*  Clinical Data: Tube placement.  Evaluate for pneumothorax.  PORTABLE CHEST - 1 VIEW  Comparison: Chest x-ray 01/04/2012.  Findings:  There has been interval placement of a tracheostomy tube, the tip of which lies 5.6 cm above the carina. There is a a right- sided internal jugular central venous catheter with tip terminating in the distal superior vena cava. There are patchy ill-defined bibasilar opacities (right greater than left) which is similar to the recent prior study.  Obscuration of the mid right hemidiaphragm is again noted.  Pulmonary vasculature appears normal.  No definite pleural effusions.  No pneumothorax.  Heart size and mediastinal contours are within normal limits.  IMPRESSION: 1.  Postoperative changes and support apparatus, as above. 2.  Patchy ill-defined opacities throughout the mid and lower lungs bilaterally (right greater than left), suspicious for multilobar pneumonia or sequelae of aspiration.  Overall, aeration has slightly worsened, particularly in the right lower lobe.  Original Report Authenticated By: Florencia Reasons, M.D.   Dg Chest Port 1 View  01/04/2012  *RADIOLOGY REPORT*  Clinical Data: ET tube placement  PORTABLE CHEST - 1 VIEW  Comparison: 01/03/2012  Findings: Endotracheal tube terminates at the thoracic inlet.  Stable right IJ venous catheter and enteric tube coursing below the diaphragm.  Multifocal patchy right lung opacities, suspicious for pneumonia or aspiration, grossly unchanged from the most recent study but improved from priors.  No pleural effusion or pneumothorax.  The heart is normal in size.  IMPRESSION: Endotracheal tube terminates at the thoracic inlet.  Multifocal patchy right lung opacities, suspicious for pneumonia or aspiration, grossly unchanged from the most recent study but improved from priors.  Original Report Authenticated By: Charline Bills, M.D.   Dg Abd Portable 1v  01/04/2012  *RADIOLOGY REPORT*  Clinical Data: Panda tube placement.  PORTABLE ABDOMEN - 1 VIEW  Comparison: None  Findings: The Panda tube tip is in the fundal region of the stomach laterally.  IMPRESSION:  Panda tube tip in the fundal region of the stomach.  Original Report Authenticated By: P. Loralie Champagne, M.D.    ASSESSMENT AND PLAN 46 year old male with PMH of cocaine, etoh and drug abuse presenting after being found down in a hotel room positive for cocaine and THC. Patient was noted to have vomitus into his airway upon intubation. RUL and RML infiltrate noted. Patient was started on Abx and hypothermia as well ARDS protocol were started.  Neuro: following commands now but requiring large amount of sedation due to history of significant drug abuse and alcoholism.  EEG shows slowness but likely multifactorial with metabolic, septic and anoxic encephalopathy. Plan: Change sedation to PRN  Cardiac: s/p cardiac arrest.  Shock resolved.  Off pressors. Plan: Hold diureses for today.  Will monitor BP.  Cath once critical illness has resolved per cardiology.  Pulmonary: began weaning today but clearly fluid overloaded and very agitated with evidence of drug withdrawal. Plan: Begin TC trials today.  F/U on BAL cultures from 4/30.  Titrate O2 down.  GI: protein calorie malnutrition. Plan: Continue TF, if able to stay off the vent x48 hours will assess ability to swallow.  Renal:  No active issues, hold diureses today.  BMET in AM.  ID: PNA, likely aspiration.  H flu in BAL. Plan: D/C vancomycin (completed 10 days)  Continue primaxin for 10/14 days.  F/U on BAL culture from 4/30.  Heme: DVT in bilateral upper ext and low platelet.  Neg HITT panel. Plan: Continue heparin.  Hold coumadin for now until PEG situation is assessed, if PEG is needed then will start coumadin after PEG placement.  Endo: ISS  Family updated bedside.  CC time 35 min.  Aysen Shieh 01/05/2012, 11:38 AM

## 2012-01-05 NOTE — Progress Notes (Signed)
[facility] Resident Note  Subjective:  Patient is a 46 y.o. male with a PMHx of drug abuse, who was admitted to [facility] on 12/24/2011 for evaluation of cardiac arrest.  The patient was apparently in a motel room doing drugs. His friend found him to be unconscious. EMS was called and they found him to be in pulseless electrical activity arrest. ACLS was started and the patient was resuscitated. The patient was brought to the closest emergency room and was admitted by the pulmonary critical care team.  Hypothermic cooling was initiated in the field with the infusion of iced saline. He was placed on the Artic Sun protocol and has now been re-warmed.    Trach placed on 5/2 by PCCM. Patient intubated and sedated. Continue to be agitated if not sedated .  Objective:  Vital Signs in the last 24 hours: Filed Vitals:   01/05/12 0400 01/05/12 0405 01/05/12 0500 01/05/12 0600  BP: 104/59 104/59 92/60 96/62   Pulse: 100 100 86 83  Temp: 98.5 F (36.9 C)     TempSrc: Oral     Resp: 20  20 20   Height:      Weight:   136 lb 3.9 oz (61.8 kg)   SpO2: 100%  100% 100%    Intake/Output from previous day: 05/02 0701 - 05/03 0700 In: 3078.8 [I.V.:1574.8; NG/GT:1200; IV Piggyback:304] Out: 5155 [Urine:5155]  24-hour weight change: Weight change: -12 lb 9.1 oz (-5.7 kg)  Weight trends: Filed Weights   01/03/12 0600 01/04/12 0300 01/05/12 0500  Weight: 158 lb 4.6 oz (71.8 kg) 148 lb 13 oz (67.5 kg) 136 lb 3.9 oz (61.8 kg)    Physical Exam: Patient intubated and sedated Neck:  No JVP, carotids 2+ equal without bruits Lungs: On the vent. Rhonchi present throughout.  CV: RRR without murmur or gallop Abd: soft, Positive BS,  Ext: no C/C/E, distal pulses intact and equal Neuro: sedated  Lab Results:  Basename 01/05/12 0254 01/04/12 0411  WBC 13.5* 14.1*  HGB 10.8* 10.1*  PLT 551* 447*    Basename 01/05/12 0254 01/04/12 0411  NA 141 141  K 4.2 3.8  CL 103 100  CO2 27 30    GLUCOSE 88 89  BUN 45* 44*  CREATININE 0.80 0.87    Basename 01/05/12 0420 01/04/12 2355 01/04/12 1934 01/04/12 1659 01/04/12 1331 01/04/12 0823  GLUCAP 110* 97 86 92 92 92    Tele:NSL  Scheduled Meds:   . antiseptic oral rinse  15 mL Mouth Rinse QID  . chlorhexidine  15 mL Mouth Rinse BID  . enalaprilat  0.625 mg Intravenous Q6H  . etomidate      . feeding supplement  30 mL Per Tube QID  . folic acid  1 mg Oral Daily  . free water  250 mL Per Tube Q4H  . furosemide  20 mg Intravenous Q6H  . haloperidol lactate  5 mg Intravenous Q6H  . imipenem-cilastatin  500 mg Intravenous Q6H  . insulin aspart  0-9 Units Subcutaneous Q4H  . lidocaine (cardiac) 100 mg/29ml      . mulitivitamin  5 mL Oral Daily  . pantoprazole sodium  40 mg Per Tube Q1200  . potassium chloride  40 mEq Per Tube TID  . QUEtiapine  25 mg Oral BID  . rocuronium      . sodium chloride  3 mL Intravenous Q12H  . succinylcholine      . thiamine  100 mg Oral Daily   Continuous  Infusions:   . sodium chloride 5 mL/hr (01/03/12 0603)  . dexmedetomidine (PRECEDEX) IV infusion Stopped (01/02/12 1300)  . feeding supplement (OXEPA) 1,000 mL (01/03/12 1237)  . fentaNYL infusion INTRAVENOUS 300 mcg/hr (01/05/12 0600)  . heparin 2,350 Units/hr (01/05/12 0405)  . midazolam (VERSED) infusion 8 mg/hr (01/05/12 0600)   PRN Meds:.sodium chloride, sodium chloride, acetaminophen (TYLENOL) oral liquid 160 mg/5 mL, fentaNYL, midazolam, sodium chloride  Imaging: Chest Portable 1 View To Assess Tube Placement And Rule-out Pneumothorax  01/04/2012  *RADIOLOGY REPORT*  Clinical Data: Tube placement.  Evaluate for pneumothorax.  PORTABLE CHEST - 1 VIEW  Comparison: Chest x-ray 01/04/2012.  Findings: There has been interval placement of a tracheostomy tube, the tip of which lies 5.6 cm above the carina. There is a a right- sided internal jugular central venous catheter with tip terminating in the distal superior vena cava. There are  patchy ill-defined bibasilar opacities (right greater than left) which is similar to the recent prior study.  Obscuration of the mid right hemidiaphragm is again noted.  Pulmonary vasculature appears normal.  No definite pleural effusions.  No pneumothorax.  Heart size and mediastinal contours are within normal limits.  IMPRESSION: 1.  Postoperative changes and support apparatus, as above. 2.  Patchy ill-defined opacities throughout the mid and lower lungs bilaterally (right greater than left), suspicious for multilobar pneumonia or sequelae of aspiration.  Overall, aeration has slightly worsened, particularly in the right lower lobe.  Original Report Authenticated By: Florencia Reasons, M.D.   Dg Chest Port 1 View  01/04/2012  *RADIOLOGY REPORT*  Clinical Data: ET tube placement  PORTABLE CHEST - 1 VIEW  Comparison: 01/03/2012  Findings: Endotracheal tube terminates at the thoracic inlet.  Stable right IJ venous catheter and enteric tube coursing below the diaphragm.  Multifocal patchy right lung opacities, suspicious for pneumonia or aspiration, grossly unchanged from the most recent study but improved from priors.  No pleural effusion or pneumothorax.  The heart is normal in size.  IMPRESSION: Endotracheal tube terminates at the thoracic inlet.  Multifocal patchy right lung opacities, suspicious for pneumonia or aspiration, grossly unchanged from the most recent study but improved from priors.  Original Report Authenticated By: Charline Bills, M.D.   Dg Abd Portable 1v  01/04/2012  *RADIOLOGY REPORT*  Clinical Data: Panda tube placement.  PORTABLE ABDOMEN - 1 VIEW  Comparison: None  Findings: The Panda tube tip is in the fundal region of the stomach laterally.  IMPRESSION: Panda tube tip in the fundal region of the stomach.  Original Report Authenticated By: P. Loralie Champagne, M.D.   Assessment/Plan:   Patient is a 46 y.o. male with a PMHx of drug abuse, who was admitted to [facility] on 12/24/2011 for  evaluation of cardiac arrest.   Cardiac:s/p PEA arrest and Artic Sun protocol 4/21 .  Questionable new onset of CHF with an EF of 25%. He currently on Vent. He is off pressors . Tolerating ACE inhibitor. Consider to start low dose beta blocker today.  Will consider further cardiac evaluation once he is off the vent and is not requiring sedation.     Length of Stay: 12 days  Patient history and plan of care reviewed with attending, Dr. Clement Sayres, M.D. 01/05/2012, 6:43 AM   Patient examined and agree except changes made.  Valera Castle, MD 01/05/2012 10:21 AM

## 2012-01-05 NOTE — Progress Notes (Signed)
ANTICOAGULATION & ANTIBIOTIC CONSULT NOTE - Follow Up Consult  Pharmacy Consult for Heparin/  Primaxin Indication:  Bilateral cephalic vein thrombosis/  Aspiration pneumonia coverage  No Known Allergies  Patient Measurements: Height: 6' (182.9 cm) Weight: 136 lb 3.9 oz (61.8 kg) IBW/kg (Calculated) : 77.6  Heparin Dosing Weight: 62 kg  Vital Signs: Temp: 98.8 F (37.1 C) (05/03 0800) Temp src: Oral (05/03 0800) BP: 112/64 mmHg (05/03 1100) Pulse Rate: 95  (05/03 1245)  Labs:  Basename 01/05/12 1230 01/05/12 0254 01/04/12 0411 01/03/12 0300  HGB -- 10.8* 10.1* --  HCT -- 31.7* 29.9* 29.7*  PLT -- 551* 447* 282  APTT -- -- -- --  LABPROT -- -- -- --  INR -- -- -- --  HEPARINUNFRC 0.37 0.21* <0.10* --  CREATININE -- 0.80 0.87 0.82  CKTOTAL -- -- -- --  CKMB -- -- -- --  TROPONINI -- -- -- --   Estimated Creatinine Clearance: 99.8 ml/min (by C-G formula based on Cr of 0.8).  Assessment:   Heparin level is now therapeutic on 2350 units/hr.    Day #5 Primaxin (after 6 days of Zosyn).   BAL culture 4/30 - no growth.   Tmax 99.2, WBC 13.5.   Expecting 14 days of antibiotics.      Goal of Therapy:  Heparin level 0.3-0.7 units/ml Appropriate Primaxin dose for renal function & infection   Plan:      Continue heparin at 2350 units/hr.     Next heparin level and CBC in am.       Continue Primaxin 500 mg IV q6hrs.  Dennie Fetters, RPh Pager: [phone] 01/05/2012,1:36 PM

## 2012-01-06 ENCOUNTER — Inpatient Hospital Stay (HOSPITAL_COMMUNITY): Payer: Medicaid Other

## 2012-01-06 LAB — CBC
HCT: 35.1 % — ABNORMAL LOW (ref 39.0–52.0)
Hemoglobin: 11.3 g/dL — ABNORMAL LOW (ref 13.0–17.0)
MCV: 95.6 fL (ref 78.0–100.0)
RBC: 3.67 MIL/uL — ABNORMAL LOW (ref 4.22–5.81)
RDW: 13.5 % (ref 11.5–15.5)
WBC: 15.6 10*3/uL — ABNORMAL HIGH (ref 4.0–10.5)

## 2012-01-06 LAB — GLUCOSE, CAPILLARY
Glucose-Capillary: 96 mg/dL (ref 70–99)
Glucose-Capillary: 109 mg/dL — ABNORMAL HIGH (ref 70–99)

## 2012-01-06 LAB — PHOSPHORUS: Phosphorus: 4.1 mg/dL (ref 2.3–4.6)

## 2012-01-06 LAB — BASIC METABOLIC PANEL
BUN: 38 mg/dL — ABNORMAL HIGH (ref 6–23)
CO2: 25 mEq/L (ref 19–32)
Chloride: 102 mEq/L (ref 96–112)
Glucose, Bld: 103 mg/dL — ABNORMAL HIGH (ref 70–99)
Potassium: 3.5 mEq/L (ref 3.5–5.1)
Sodium: 141 mEq/L (ref 135–145)

## 2012-01-06 LAB — HEPARIN LEVEL (UNFRACTIONATED): Heparin Unfractionated: 0.61 IU/mL (ref 0.30–0.70)

## 2012-01-06 MED ORDER — ONDANSETRON HCL 4 MG/2ML IJ SOLN
4.0000 mg | INTRAMUSCULAR | Status: DC | PRN
  Administered 2012-01-06: 4 mg via INTRAVENOUS

## 2012-01-06 MED ORDER — HEPARIN (PORCINE) IN NACL 100-0.45 UNIT/ML-% IJ SOLN
2050.0000 [IU]/h | INTRAMUSCULAR | Status: DC
  Administered 2012-01-06 – 2012-01-07 (×2): 2500 [IU]/h via INTRAVENOUS
  Administered 2012-01-08: 2050 [IU]/h via INTRAVENOUS
  Administered 2012-01-08: 2300 [IU]/h via INTRAVENOUS
  Administered 2012-01-08: 2150 [IU]/h via INTRAVENOUS
  Administered 2012-01-09: 2050 [IU]/h via INTRAVENOUS
  Filled 2012-01-06 (×11): qty 250

## 2012-01-06 MED ORDER — METOPROLOL TARTRATE 1 MG/ML IV SOLN
5.0000 mg | Freq: Four times a day (QID) | INTRAVENOUS | Status: DC
  Administered 2012-01-06 – 2012-01-07 (×2): 5 mg via INTRAVENOUS
  Filled 2012-01-06 (×6): qty 5

## 2012-01-06 MED ORDER — ONDANSETRON HCL 4 MG/2ML IJ SOLN
4.0000 mg | Freq: Three times a day (TID) | INTRAMUSCULAR | Status: DC | PRN
  Filled 2012-01-06: qty 2

## 2012-01-06 MED ORDER — ONDANSETRON HCL 4 MG/2ML IJ SOLN
INTRAMUSCULAR | Status: AC
  Filled 2012-01-06: qty 2

## 2012-01-06 NOTE — Progress Notes (Signed)
ANTICOAGULATION & ANTIBIOTIC CONSULT NOTE - Follow Up Consult  Pharmacy Consult for Heparin/Primaxin Indication:  Bilateral cephalic vein thrombosis/  Aspiration pneumonia coverage  Assessment: 46 yo male with significant history of IV drug abuse & bilateral cephalic vein thrombosis, continues on primaxin Day #6 (after 6 days of Zosyn) & IV heparin with subtherapeutic level (HL=0.19).  No bleeding issues reported.  Tm 100.1, WBC increased to 15.6, SCr stable at 0.69, BP 117-131/68-72 HR 110  Antibiotics: Vanc 4/22 >> 4/25, restart 4/28>>5/1 Zosyn 4/22 >>4/28 Imipenem 4/28>>  Cultures:  BCx 4/21: H flu x 1. Beta-lactamase neg, serotype pending UCx 4/21: neg Trach asp 4/21: normal flora Resp 4/26-nf UCx 4/27-ng BAL 4/27-nf BCx 4/27-ngtd  Goal of Therapy:  Heparin level 0.3-0.7 units/ml Primaxin dosed for renal function    Plan:  1. Increase IV heparin rate to 2500 units/hr.  Check 8-hour heparin level. 2. Continue Primaxin 500mg  IV q6h  Bayard Beaver. Saul Fordyce, PharmD Pager: [phone] 01/06/2012,8:41 AM  No Known Allergies  Patient Measurements: Height: 6' (182.9 cm) Weight: 136 lb 3.9 oz (61.8 kg) IBW/kg (Calculated) : 77.6  Heparin Dosing Weight: 62 kg  Vital Signs: Temp: 100.1 F (37.8 C) (05/04 0414) Temp src: Oral (05/04 0414) BP: 117/68 mmHg (05/04 0815) Pulse Rate: 110  (05/04 0815)  Labs:  Basename 01/06/12 0730 01/05/12 1230 01/05/12 0254 01/04/12 0411  HGB 11.3* -- 10.8* --  HCT 35.1* -- 31.7* 29.9*  PLT 778* -- 551* 447*  APTT -- -- -- --  LABPROT -- -- -- --  INR -- -- -- --  HEPARINUNFRC 0.19* 0.37 0.21* --  CREATININE -- -- 0.80 0.87  CKTOTAL -- -- -- --  CKMB -- -- -- --  TROPONINI -- -- -- --   Estimated Creatinine Clearance: 99.8 ml/min (by C-G formula based on Cr of 0.8).

## 2012-01-06 NOTE — Progress Notes (Signed)
ANTICOAGULATION CONSULT NOTE - Follow Up Consult  Pharmacy Consult for heparin Indication: bilateral cephalic vein thrombosis  No Known Allergies  Patient Measurements: Height: 6' (182.9 cm) Weight: 136 lb 3.9 oz (61.8 kg) IBW/kg (Calculated) : 77.6  Heparin Dosing Weight: 62kg  Vital Signs: BP: 110/65 mmHg (05/04 1141) Pulse Rate: 96  (05/04 1141)  Labs:  Basename 01/06/12 1623 01/06/12 0730 01/05/12 1230 01/05/12 0254 01/04/12 0411  HGB -- 11.3* -- 10.8* --  HCT -- 35.1* -- 31.7* 29.9*  PLT -- 778* -- 551* 447*  APTT -- -- -- -- --  LABPROT -- -- -- -- --  INR -- -- -- -- --  HEPARINUNFRC 0.61 0.19* 0.37 -- --  CREATININE -- 0.69 -- 0.80 0.87  CKTOTAL -- -- -- -- --  CKMB -- -- -- -- --  TROPONINI -- -- -- -- --   Estimated Creatinine Clearance: 99.8 ml/min (by C-G formula based on Cr of 0.69).   Medications:  Infusions:    . sodium chloride 5 mL/hr (01/03/12 0603)  . feeding supplement (OXEPA) 1,000 mL (01/05/12 2029)  . heparin 2,350 Units/hr (01/05/12 2220)  . heparin 2,500 Units/hr (01/06/12 1610)    Assessment: 45 yom with significant history of IV drug abuse and bilateral cephalic vein thrombosis continues on IV heparin. Heparin level is therapeutic at 0.61. No bleeding noted.   Goal of Therapy:  Heparin level 0.3-0.7 units/ml   Plan:  1. Continue heparin gtt at 2500units/hr 2. F/u AM heparin level  Om Lizotte, Drake Leach 01/06/2012,5:03 PM

## 2012-01-06 NOTE — Progress Notes (Signed)
Name: Angel [name] DOB: 08-Apr-1966 MRN: [id number] PCP: No primary provider on file. ADMIT DATE: 12/24/2011 LOS: 13  PCCM PROGRESS NOTE  Brief Patient Profile:46 YO man with no past medical history. His family states that he abuses some perscription drugs and some occasional alcohol. Smokes 2 PPD history. Daughter tried to call him all day but he was sleeping which is unusual for him. His friend found him shaking violently around 8 PM and called 911 who intubated in the field for agonal breathing. He was unresponsive at the time. He was brought to the ED and placed on hypothermia protocol. No ischemic EKG changes on ED arrival. UDS positive for cocaine and THC.    Best Practice: DVT: SQ Heparin >> 4/27 (due to low plat), ScD 4/7 and Argotraban (mod prob HIT) 4/27 > SUP: protonix Nutrition: Tube feed Glycemic control:  Sedation/analgesia: Cont sedation (off precedex 4/26)  Lines / Drains: ETT 4/21>>> 5/2 CVC 4/21>>> Janina Mayo Molli Knock) 5/2>>>  Micro: Blood 4/21>>>HAEMOPHILUS INFLUENZAE Urine 4/21>>>Neg  Sputum 4/21>>>GNR and GPC - NORMAL FLORA ....................... BC 4/27 NTD BAL 4/27 NTD  Urine 4/27 NTD BAL 4/30>>>NTD  Abx Vancomycin 4/21>>>5/1 Primaxin 4/24>>>   Lab 12/30/11 1002  LATICACIDVEN 0.9   Recent Results (from the past 240 hour(s))  CULTURE, RESPIRATORY     Status: Normal   Collection Time   12/29/11 11:26 AM      Component Value Range Status Comment   Specimen Description OTHER   Final    Special Requests ETT SUCTION   Final    Gram Stain     Final    Value: FEW WBC PRESENT,BOTH PMN AND MONONUCLEAR     NO SQUAMOUS EPITHELIAL CELLS SEEN     NO ORGANISMS SEEN   Culture Non-Pathogenic Oropharyngeal-type Flora Isolated.   Final    Report Status 01/01/2012 FINAL   Final   CULTURE, BLOOD (ROUTINE X 2)     Status: Normal   Collection Time   12/30/11 11:08 AM      Component Value Range Status Comment   Specimen Description BLOOD ARM RIGHT   Final    Special  Requests BOTTLES DRAWN AEROBIC AND ANAEROBIC [facility]   Final    Culture  Setup Time [id number]   Final    Culture NO GROWTH 5 DAYS   Final    Report Status 01/05/2012 FINAL   Final   CULTURE, BLOOD (ROUTINE X 2)     Status: Normal   Collection Time   12/30/11 11:20 AM      Component Value Range Status Comment   Specimen Description BLOOD ARM RIGHT   Final    Special Requests BOTTLES DRAWN AEROBIC ONLY 10CC    Final    Culture  Setup Time [id number]   Final    Culture NO GROWTH 5 DAYS   Final    Report Status 01/05/2012 FINAL   Final   URINE CULTURE     Status: Normal   Collection Time   12/30/11 11:22 AM      Component Value Range Status Comment   Specimen Description URINE, CATHETERIZED   Final    Special Requests NONE   Final    Culture  Setup Time [id number]   Final    Colony Count NO GROWTH   Final    Culture NO GROWTH   Final    Report Status 12/31/2011 FINAL   Final   CULTURE, BAL-QUANTITATIVE     Status: Normal   Collection Time  12/30/11 12:51 PM      Component Value Range Status Comment   Specimen Description BRONCHIAL ALVEOLAR LAVAGE   Final    Special Requests NONE   Final    Gram Stain     Final    Value: MODERATE WBC PRESENT, PREDOMINANTLY PMN     NO SQUAMOUS EPITHELIAL CELLS SEEN     NO ORGANISMS SEEN   Colony Count 2,000 COLONIES/ML   Final    Culture Non-Pathogenic Oropharyngeal-type Flora Isolated.   Final    Report Status 01/02/2012 FINAL   Final   AFB CULTURE WITH SMEAR     Status: Normal (Preliminary result)   Collection Time   01/02/12  3:05 PM      Component Value Range Status Comment   Specimen Description BRONCHIAL ALVEOLAR LAVAGE   Final    Special Requests ADDED 01/02/12 1552   Final    ACID FAST SMEAR NO ACID FAST BACILLI SEEN   Final    Culture     Final    Value: CULTURE WILL BE EXAMINED FOR 6 WEEKS BEFORE ISSUING A FINAL REPORT   Report Status PENDING   Incomplete   FUNGUS CULTURE W SMEAR     Status: Normal (Preliminary result)    Collection Time   01/02/12  3:05 PM      Component Value Range Status Comment   Specimen Description BRONCHIAL ALVEOLAR LAVAGE   Final    Special Requests ADDED 01/02/12 1552   Final    Fungal Smear NO YEAST OR FUNGAL ELEMENTS SEEN   Final    Culture CULTURE IN PROGRESS FOR FOUR WEEKS   Final    Report Status PENDING   Incomplete   CULTURE, BAL-QUANTITATIVE     Status: Normal   Collection Time   01/02/12  3:05 PM      Component Value Range Status Comment   Specimen Description BRONCHIAL ALVEOLAR LAVAGE LUNG RIGHT LOWER   Final    Special Requests NONE   Final    Gram Stain     Final    Value: RARE WBC PRESENT,BOTH PMN AND MONONUCLEAR     NO SQUAMOUS EPITHELIAL CELLS SEEN     NO ORGANISMS SEEN   Colony Count NO GROWTH   Final    Culture NO GROWTH 2 DAYS   Final    Report Status 01/04/2012 FINAL   Final     Antibiotics: Anti-infectives     Start     Dose/Rate Route Frequency Ordered Stop   01/01/12 1200   imipenem-cilastatin (PRIMAXIN) 500 mg in sodium chloride 0.9 % 100 mL IVPB        500 mg 200 mL/hr over 30 Minutes Intravenous 4 times per day 01/01/12 0943     12/31/11 1400   imipenem-cilastatin (PRIMAXIN) 500 mg in sodium chloride 0.9 % 100 mL IVPB  Status:  Discontinued        500 mg 200 mL/hr over 30 Minutes Intravenous 3 times per day 12/31/11 1121 01/01/12 0943   12/31/11 1300   vancomycin (VANCOCIN) 1,250 mg in sodium chloride 0.9 % 250 mL IVPB  Status:  Discontinued        1,250 mg 166.7 mL/hr over 90 Minutes Intravenous Every 12 hours 12/31/11 1120 01/03/12 1105   12/25/11 0600   piperacillin-tazobactam (ZOSYN) IVPB 3.375 g  Status:  Discontinued        3.375 g 12.5 mL/hr over 240 Minutes Intravenous 3 times per day 12/24/11 2257 12/31/11 1028   12/24/11  2359   vancomycin (VANCOCIN) IVPB 1000 mg/200 mL premix  Status:  Discontinued        1,000 mg 200 mL/hr over 60 Minutes Intravenous Daily at bedtime 12/24/11 2301 12/29/11 1009   12/24/11 2045   piperacillin-tazobactam (ZOSYN) 3.375 g in dextrose 5 % 50 mL IVPB       3.375 g 100 mL/hr over 30 Minutes Intravenous [facility] 12/24/11 2011 12/24/11 2206   12/24/11 2015   vancomycin (VANCOCIN) IVPB 1000 mg/200 mL premix  Status:  Discontinued        1,000 mg 200 mL/hr over 60 Minutes Intravenous  Once 12/24/11 2011 12/24/11 2301         BMET    Component Value Date/Time   NA 141 01/06/2012 0730   K 3.5 01/06/2012 0730   CL 102 01/06/2012 0730   CO2 25 01/06/2012 0730   GLUCOSE 103* 01/06/2012 0730   BUN 38* 01/06/2012 0730   CREATININE 0.69 01/06/2012 0730   CALCIUM 9.4 01/06/2012 0730   GFRNONAA >90 01/06/2012 0730   GFRAA >90 01/06/2012 0730   CBC    Component Value Date/Time   WBC 15.6* 01/06/2012 0730   RBC 3.67* 01/06/2012 0730   HGB 11.3* 01/06/2012 0730   HCT 35.1* 01/06/2012 0730   PLT 778* 01/06/2012 0730   MCV 95.6 01/06/2012 0730   MCH 30.8 01/06/2012 0730   MCHC 32.2 01/06/2012 0730   RDW 13.5 01/06/2012 0730   LYMPHSABS 0.8 12/24/2011 2009   MONOABS 0.1 12/24/2011 2009   EOSABS 0.0 12/24/2011 2009   BASOSABS 0.0 12/24/2011 2009   Tests / Events:  4/27 - HIT panel negative, off angiomax back to heparin. 4/28: agitated on wUA but followed commands.   Consults: Neuro (signed off 5/1).  Subjective/Overnight/Interval History:  Extremely agitated on WUA. Moves all 4s. Intense Cough + Gag +. Corneals +. Per RN: followed commands x today to neurologist  Per family on 4/27: localized, had tear in eye at mention of youngest daughter  Copious pink secretions from ET tube +  Fall overnight on 5/3 with N/V, head CT negative and physical exam unchanged from a neuro standpoint.  Vital Signs: Temp:  [98.3 F (36.8 C)-100.1 F (37.8 C)] 100.1 F (37.8 C) (05/04 0414) Pulse Rate:  [89-110] 110  (05/04 0815) Resp:  [20-45] 25  (05/04 0815) BP: (103-131)/(46-83) 117/68 mmHg (05/04 0815) SpO2:  [93 %-100 %] 100 % (05/04 0815) FiO2 (%):  [29.6 %-40.3 %] 35 % (05/04 0815) I/O last  3 completed shifts: In: 4149.6 [I.V.:1938.1; NG/GT:1807.5; IV Piggyback:404] Out: 7829 [FAOZH:0865; Emesis/NG output:1; Stool:120]  Intake/Output Summary (Last 24 hours) at 01/06/12 0852 Last data filed at 01/06/12 0700  Gross per 24 hour  Intake 1548.1 ml  Output   2371 ml  Net -822.9 ml   Physical Examination: General: Critically ill looking male. On PS.  Neuro: Extremely agitated on WUA. Moving all 4s. Cough +. Corneals +, REports that he followed commands +   HEENT:  corneals +. No jaundice Neck: Trach and clearn Cardiovascular:  Normal heart sounds.+ Chest: Cleart to ausculatation Lungs:  Normal chest Abdomen:  Soft, non tennder Musculoskeletal:   Normal joints Skin:  intact Extremities:No edema, no cyanosis, no clubbing.  Ventilator settings: Vent Mode:  [-] PRVC FiO2 (%):  [29.6 %-40.3 %] 35 % Set Rate:  [20 bmp] 20 bmp Vt Set:  [620 mL] 620 mL PEEP:  [5 cmH20] 5 cmH20 Plateau Pressure:  [14 cmH20-18 cmH20] 14 cmH20  Labs/Radiology: Please see A and P for full labs  Dg Abd 1 View  01/06/2012  *RADIOLOGY REPORT*  Clinical Data: Nausea, feeding tube placement  ABDOMEN - 1 VIEW  Comparison: 01/05/2012  Findings: Panda type feeding tube extends to the gastric antrum. There is mild gaseous distention of the stomach and a few proximal small bowel loops.  Colon is decompressed.  Lower abdomen is excluded.  IMPRESSION:  1.  Panda to the gastric antrum.  Original Report Authenticated By: Osa Craver, M.D.   Ct Head Wo Contrast  01/06/2012  *RADIOLOGY REPORT*  Clinical Data: Nausea, vomiting, post fall  CT HEAD WITHOUT CONTRAST  Technique:  Contiguous axial images were obtained from the base of the skull through the vertex without contrast.  Comparison: 01/01/2012  Findings: Bilateral maxillary sinus disease, left worse than right. Nasogastric tube is partially seen. There is no evidence of acute intracranial hemorrhage, brain edema, mass lesion, acute infarction,   mass  effect, or midline shift. Acute infarct may be inapparent on noncontrast CT.  No other intra-axial abnormalities are seen, and the ventricles and sulci are within normal limits in size and symmetry.   No abnormal extra-axial fluid collections or masses are identified.  No significant calvarial abnormality.  IMPRESSION: 1. Negative for bleed or other acute intracranial process. 2.  Bilateral maxillary sinus disease.  Original Report Authenticated By: Osa Craver, M.D.   Dg Chest Port 1 View  01/05/2012  *RADIOLOGY REPORT*  Clinical Data: Trach placement  PORTABLE CHEST - 1 VIEW  Comparison: 01/04/2012  Findings: Stable tracheostomy at the thoracic inlet.  Weighted feeding tube terminating in the gastric cardia.  Stable right IJ venous catheter.  Patchy opacities in the right mid/upper lung lungs, possibly reflecting pneumonia or aspiration, improved.  No pleural effusion or pneumothorax.  The heart is normal in size.  IMPRESSION: Patchy opacities in the right mid/upper lungs, possibly reflecting pneumonia or aspiration, improved.  Stable tracheostomy at the thoracic inlet.  Stable support apparatus.  Original Report Authenticated By: Charline Bills, M.D.   Chest Portable 1 View To Assess Tube Placement And Rule-out Pneumothorax  01/04/2012  *RADIOLOGY REPORT*  Clinical Data: Tube placement.  Evaluate for pneumothorax.  PORTABLE CHEST - 1 VIEW  Comparison: Chest x-ray 01/04/2012.  Findings: There has been interval placement of a tracheostomy tube, the tip of which lies 5.6 cm above the carina. There is a a right- sided internal jugular central venous catheter with tip terminating in the distal superior vena cava. There are patchy ill-defined bibasilar opacities (right greater than left) which is similar to the recent prior study.  Obscuration of the mid right hemidiaphragm is again noted.  Pulmonary vasculature appears normal.  No definite pleural effusions.  No pneumothorax.  Heart size and  mediastinal contours are within normal limits.  IMPRESSION: 1.  Postoperative changes and support apparatus, as above. 2.  Patchy ill-defined opacities throughout the mid and lower lungs bilaterally (right greater than left), suspicious for multilobar pneumonia or sequelae of aspiration.  Overall, aeration has slightly worsened, particularly in the right lower lobe.  Original Report Authenticated By: Florencia Reasons, M.D.   Dg Abd Portable 1v  01/05/2012  *RADIOLOGY REPORT*  Clinical Data: Panda tube placement  PORTABLE ABDOMEN - 1 VIEW  Comparison: Jan 04, 2012  Findings: The panda tube tip is coiled within the body of the stomach.  The stool and bowel gas pattern is within normal limits with no evidence of obstruction or gross  pneumoperitoneum.  IMPRESSION: Panda tube tip in gastric body.  Original Report Authenticated By: Brandon Melnick, M.D.   Dg Abd Portable 1v  01/04/2012  *RADIOLOGY REPORT*  Clinical Data: Panda tube placement.  PORTABLE ABDOMEN - 1 VIEW  Comparison: None  Findings: The Panda tube tip is in the fundal region of the stomach laterally.  IMPRESSION: Panda tube tip in the fundal region of the stomach.  Original Report Authenticated By: P. Loralie Champagne, M.D.    ASSESSMENT AND PLAN 46 year old male with PMH of cocaine, etoh and drug abuse presenting after being found down in a hotel room positive for cocaine and THC. Patient was noted to have vomitus into his airway upon intubation. RUL and RML infiltrate noted. Patient was started on Abx and hypothermia as well ARDS protocol were started.  Neuro: following commands now and sedation needs decreasing but has a significant history of drug abuse and alcoholism.  EEG shows slowness but likely multifactorial with metabolic, septic and anoxic encephalopathy. Plan: Continue PRN sedation.  PRN Haldol.  Cardiac: s/p cardiac arrest.  Shock resolved.  Off pressors. Plan: Hold diureses for today.  Will monitor BP.  Cath once critical  illness has resolved per cardiology.  Beta blockers for sinus tachycardia.  Pulmonary: began weaning today but clearly fluid overloaded and very agitated with evidence of drug withdrawal. Plan: TC trials today.  F/U on BAL cultures from 4/30, NTD.  Titrate O2 down.  GI: protein calorie malnutrition.  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 Plan: Hold TF for today, will continue zofran and restart TF again in AM.  Renal:  No active issues, hold diureses today.  BMET in AM.  ID: PNA, likely aspiration.  H flu in BAL. Plan: D/C vancomycin (completed 10 days)  Continue primaxin for 11/14 days.  Heme: DVT in bilateral upper ext and low platelet.  Neg HITT panel. Plan: Continue heparin.  Hold coumadin for now until PEG situation is assessed, if PEG is needed then will start coumadin after PEG placement.  Endo: ISS  Xena Propst 01/06/2012, 8:52 AM

## 2012-01-06 NOTE — Progress Notes (Signed)
Patient ID: Angel [name], male   DOB: [birth year], 46 y.o.   MRN: [id number]   Patient Name: Angel [name] Date of Encounter: 01/06/2012    SUBJECTIVE  Patient is being weaned from the vent. Tracheotomy 2 days ago. He is alert and oriented. He vomited this morning. Respiratory at the bedside. Remains mildly sinus tach.  CURRENT MEDS    . antiseptic oral rinse  15 mL Mouth Rinse QID  . chlorhexidine  15 mL Mouth Rinse BID  . enalaprilat  0.625 mg Intravenous Q6H  . feeding supplement  30 mL Per Tube QID  . folic acid  1 mg Oral Daily  . free water  250 mL Per Tube Q4H  . haloperidol lactate  5 mg Intravenous Q6H  . imipenem-cilastatin  500 mg Intravenous Q6H  . insulin aspart  0-9 Units Subcutaneous Q4H  . mulitivitamin  5 mL Oral Daily  . ondansetron      . pantoprazole sodium  40 mg Per Tube Q1200  . potassium chloride  40 mEq Per Tube TID  . QUEtiapine  25 mg Oral BID  . sodium chloride  3 mL Intravenous Q12H  . thiamine  100 mg Oral Daily    OBJECTIVE  Filed Vitals:   01/06/12 0414 01/06/12 0500 01/06/12 0600 01/06/12 0700  BP:  127/63 121/67 105/58  Pulse:      Temp: 100.1 F (37.8 C)     TempSrc: Oral     Resp:  25 22 20   Height:      Weight:      SpO2:        Intake/Output Summary (Last 24 hours) at 01/06/12 0833 Last data filed at 01/06/12 0700  Gross per 24 hour  Intake 1548.1 ml  Output   2371 ml  Net -822.9 ml   Filed Weights   01/03/12 0600 01/04/12 0300 01/05/12 0500  Weight: 158 lb 4.6 oz (71.8 kg) 148 lb 13 oz (67.5 kg) 136 lb 3.9 oz (61.8 kg)    PHYSICAL EXAM  General: Pleasant, NAD. Neuro: Alert and oriented X 3. Moves all extremities spontaneously.  HEENT:  Normal  Neck: Supple without bruits or JVD, tracheotomy intact Lungs:  Resp regular and unlabored, CTA. Heart: RR no s3, s4, or murmurs. Abdomen: Soft, non-tender, non-distended, BS + x 4.  Extremities: No clubbing, cyanosis or edema. DP/PT/Radials 2+ and equal  bilaterally.  Accessory Clinical Findings  CBC  Basename 01/06/12 0730 01/05/12 0254  WBC 15.6* 13.5*  NEUTROABS -- --  HGB 11.3* 10.8*  HCT 35.1* 31.7*  MCV 95.6 95.5  PLT 778* 551*   Basic Metabolic Panel  Basename 01/05/12 0254 01/04/12 0411  NA 141 141  K 4.2 3.8  CL 103 100  CO2 27 30  GLUCOSE 88 89  BUN 45* 44*  CREATININE 0.80 0.87  CALCIUM 9.2 9.0  MG 2.3 2.3  PHOS 3.8 3.9   Liver Function Tests No results found for this basename: AST:2,ALT:2,ALKPHOS:2,BILITOT:2,PROT:2,ALBUMIN:2 in the last 72 hours No results found for this basename: LIPASE:2,AMYLASE:2 in the last 72 hours Cardiac Enzymes No results found for this basename: CKTOTAL:3,CKMB:3,CKMBINDEX:3,TROPONINI:3 in the last 72 hours BNP No components found with this basename: POCBNP:3 D-Dimer No results found for this basename: DDIMER:2 in the last 72 hours Hemoglobin A1C No results found for this basename: HGBA1C in the last 72 hours Fasting Lipid Panel No results found for this basename: CHOL,HDL,LDLCALC,TRIG,CHOLHDL,LDLDIRECT in the last 72 hours Thyroid Function Tests No results found for this basename:  TSH,T4TOTAL,FREET3,T3FREE,THYROIDAB in the last 72 hours  TELE  Sinus tachycardia  ECG   Radiology/Studies  Dg Abd 1 View  01/06/2012  *RADIOLOGY REPORT*  Clinical Data: Nausea, feeding tube placement  ABDOMEN - 1 VIEW  Comparison: 01/05/2012  Findings: Panda type feeding tube extends to the gastric antrum. There is mild gaseous distention of the stomach and a few proximal small bowel loops.  Colon is decompressed.  Lower abdomen is excluded.  IMPRESSION:  1.  Panda to the gastric antrum.  Original Report Authenticated By: Osa Craver, M.D.   Ct Head Wo Contrast  01/06/2012  *RADIOLOGY REPORT*  Clinical Data: Nausea, vomiting, post fall  CT HEAD WITHOUT CONTRAST  Technique:  Contiguous axial images were obtained from the base of the skull through the vertex without contrast.  Comparison:  01/01/2012  Findings: Bilateral maxillary sinus disease, left worse than right. Nasogastric tube is partially seen. There is no evidence of acute intracranial hemorrhage, brain edema, mass lesion, acute infarction,   mass effect, or midline shift. Acute infarct may be inapparent on noncontrast CT.  No other intra-axial abnormalities are seen, and the ventricles and sulci are within normal limits in size and symmetry.   No abnormal extra-axial fluid collections or masses are identified.  No significant calvarial abnormality.  IMPRESSION: 1. Negative for bleed or other acute intracranial process. 2.  Bilateral maxillary sinus disease.  Original Report Authenticated By: Osa Craver, M.D.   Ct Head Wo Contrast  01/01/2012  *RADIOLOGY REPORT*  Clinical Data: Cardiac arrest  CT HEAD WITHOUT CONTRAST  Technique:  Contiguous axial images were obtained from the base of the skull through the vertex without contrast.  Comparison: Head CT 12/24/2011  Findings: No acute intracranial hemorrhage.  No focal mass lesion. No CT evidence of acute infarction.   No midline shift or mass effect.  No hydrocephalus.  Basilar cisterns are patent.  There is extensive fluid within the maxillary sinuses which is increased in the left maxillary sinus.  Small of fluid in the right mastoid air cells which is also new.  IMPRESSION: 1.  No acute intracranial findings. 2.  Sinusitis and fluid in the right mastoid air cells.  Original Report Authenticated By: Genevive Bi, M.D.   Ct Head Wo Contrast  12/24/2011  *RADIOLOGY REPORT*  Clinical Data: Ventilated, post CPR, altered mental status  CT HEAD WITHOUT CONTRAST  Technique:  Contiguous axial images were obtained from the base of the skull through the vertex without contrast.  Comparison: Report of prior non digital study 01/03/2000 is reviewed.  Findings: No acute hemorrhage, acute infarction, or mass lesion is identified.  No midline shift.  No ventriculomegaly.  No skull  fracture.  Bilateral maxillary sinus air fluid levels are noted. Partial opacification of the ethmoid and sphenoid sinuses.  Orbits are unremarkable.  IMPRESSION: No acute intracranial finding.  Sinusitis.  Original Report Authenticated By: Harrel Lemon, M.D.   Dg Chest Port 1 View  01/05/2012  *RADIOLOGY REPORT*  Clinical Data: Trach placement  PORTABLE CHEST - 1 VIEW  Comparison: 01/04/2012  Findings: Stable tracheostomy at the thoracic inlet.  Weighted feeding tube terminating in the gastric cardia.  Stable right IJ venous catheter.  Patchy opacities in the right mid/upper lung lungs, possibly reflecting pneumonia or aspiration, improved.  No pleural effusion or pneumothorax.  The heart is normal in size.  IMPRESSION: Patchy opacities in the right mid/upper lungs, possibly reflecting pneumonia or aspiration, improved.  Stable tracheostomy at the thoracic  inlet.  Stable support apparatus.  Original Report Authenticated By: Charline Bills, M.D.   Chest Portable 1 View To Assess Tube Placement And Rule-out Pneumothorax  01/04/2012  *RADIOLOGY REPORT*  Clinical Data: Tube placement.  Evaluate for pneumothorax.  PORTABLE CHEST - 1 VIEW  Comparison: Chest x-ray 01/04/2012.  Findings: There has been interval placement of a tracheostomy tube, the tip of which lies 5.6 cm above the carina. There is a a right- sided internal jugular central venous catheter with tip terminating in the distal superior vena cava. There are patchy ill-defined bibasilar opacities (right greater than left) which is similar to the recent prior study.  Obscuration of the mid right hemidiaphragm is again noted.  Pulmonary vasculature appears normal.  No definite pleural effusions.  No pneumothorax.  Heart size and mediastinal contours are within normal limits.  IMPRESSION: 1.  Postoperative changes and support apparatus, as above. 2.  Patchy ill-defined opacities throughout the mid and lower lungs bilaterally (right greater than left),  suspicious for multilobar pneumonia or sequelae of aspiration.  Overall, aeration has slightly worsened, particularly in the right lower lobe.  Original Report Authenticated By: Florencia Reasons, M.D.   Dg Chest Port 1 View  01/04/2012  *RADIOLOGY REPORT*  Clinical Data: ET tube placement  PORTABLE CHEST - 1 VIEW  Comparison: 01/03/2012  Findings: Endotracheal tube terminates at the thoracic inlet.  Stable right IJ venous catheter and enteric tube coursing below the diaphragm.  Multifocal patchy right lung opacities, suspicious for pneumonia or aspiration, grossly unchanged from the most recent study but improved from priors.  No pleural effusion or pneumothorax.  The heart is normal in size.  IMPRESSION: Endotracheal tube terminates at the thoracic inlet.  Multifocal patchy right lung opacities, suspicious for pneumonia or aspiration, grossly unchanged from the most recent study but improved from priors.  Original Report Authenticated By: Charline Bills, M.D.   Dg Chest Port 1 View  01/03/2012  *RADIOLOGY REPORT*  Clinical Data: Evaluate endotracheal tube position  PORTABLE CHEST - 1 VIEW  Comparison: Portable chest x-ray of 01/02/2012  Findings: The tip of the endotracheal tube is approximately 5.1 cm above the carina.  Patchy airspace disease primarily in the right upper, mid, and lower lung field is again noted although aeration of the left upper lung has improved in the interval.  Heart size is stable.  An NG tube is present.  Right IJ central venous line is unchanged in position.  IMPRESSION:  1.  Some improvement in patchy airspace disease particularly in the left upper lung field. 2.  Tip of endotracheal tube is 5.1 cm above the carina.  Original Report Authenticated By: Juline Patch, M.D.   Dg Chest Port 1 View  01/02/2012  *RADIOLOGY REPORT*  Clinical Data: Post bronchoscopy.  PORTABLE CHEST - 1 VIEW  Comparison: 01/02/2012  Findings: Endotracheal tube and NG tube are unchanged.  No  pneumothorax.  Bilateral airspace disease again noted, diffuse on the right and increasing in the left upper lobe.  Heart is normal size.  IMPRESSION: No pneumothorax following bronchoscopy.  Endotracheal tube remains approximately 4 cm above the carina.  Stable right lung airspace disease.  Increasing left upper lobe opacity.  Original Report Authenticated By: Cyndie Chime, M.D.   Dg Chest Port 1 View  01/02/2012  *RADIOLOGY REPORT*  Clinical Data: Assess endotracheal tube placement.  PORTABLE CHEST - 1 VIEW  Comparison: Chest x-ray 01/01/2012.  Findings: An endotracheal tube is in place with tip 4.3 cm above  the carina. There is a right-sided internal jugular central venous catheter with tip terminating in the distal superior vena cava.  A nasogastric tube is seen extending into the stomach.  There continues to be extensive airspace consolidation throughout the right lung, with a moderate right-sided posterior layering pleural effusion.  The left lung appears relatively clear with some mild perihilar vascular prominence.  Heart size is normal.  Mediastinal contours are unremarkable.  IMPRESSION: 1.  Support apparatus, as above. 2.  Persistent right-sided pneumonia with moderate posterior layering right-sided pleural effusion. 3.  Mild pulmonary vascular congestion again noted.  Original Report Authenticated By: Florencia Reasons, M.D.   Dg Chest Port 1 View  01/01/2012  *RADIOLOGY REPORT*  Clinical Data: Pneumonia.  Check endotracheal tube position.  PORTABLE CHEST - 1 VIEW  Comparison: Portable chest 12/31/2011.  Findings: The endotracheal tube terminates at the level of the clavicles, well above the carina.  A right IJ line is stable in the distal SVC.  The NG tube courses off the inferior border of the film.  The heart size is normal.  A right upper lobe pneumonia persists. Moderate pulmonary vascular congestion is noted bilaterally. Generalized density in the right lung likely represents asymmetric  edema.  IMPRESSION:  1.  Persistent right upper lobe pneumonia. 2.  The support apparatus is stable and in satisfactory position. 3.  Stable moderate pulmonary vascular congestion.  Original Report Authenticated By: Jamesetta Orleans. MATTERN, M.D.   Dg Chest Port 1 View  12/31/2011  *RADIOLOGY REPORT*  Clinical Data: Acute respiratory failure.  PORTABLE CHEST - 1 VIEW  Comparison: Yesterday  Findings: Endotracheal tube in satisfactory position.  Right jugular catheter tip in the superior vena cava.  Nasogastric tube extending into the stomach.  Normal sized heart.  Decreased airspace opacity in the right upper lung zone and left apical region.  Interval scattered airspace opacities elsewhere in both lungs.  Unremarkable bones.  IMPRESSION:  1.  Improving bilateral upper lobe pneumonia. 2.  Interval scattered airspace opacities in both lungs.  These could represent areas of alveolar edema or developing pneumonia.  Original Report Authenticated By: Darrol Angel, M.D.   Dg Chest Port 1 View  12/30/2011  *RADIOLOGY REPORT*  Clinical Data: Respiratory failure with ARDS and aspiration pneumonia.  PORTABLE CHEST - 1 VIEW  Comparison: 12/29/2011  Findings: Endotracheal tube remains present with tip approximately 3 cm above the carina.  Central line and nasogastric tube positioning are stable.  Aeration of the left lung shows improvement since prior study.  There remains airspace disease and pneumonia in the right upper lung.  No gross pulmonary edema.  IMPRESSION: Improved aeration of the left lung.  Persistent pneumonia in the right lung.  Original Report Authenticated By: Reola Calkins, M.D.   Dg Chest Port 1 View  12/29/2011  *RADIOLOGY REPORT*  Clinical Data: Endotracheal tube positioning  PORTABLE CHEST - 1 VIEW  Comparison: 12/28/2011  Findings: The endotracheal tube is stable in position, 4 cm above the level of the carina.  Right internal jugular CVP and nasogastric tube remain in place.  Lower lung  volumes are present taking this into consideration the heart size is within normal limits. Increase in pulmonary vascular congestion is noted.  Persistent asymmetry of density in the right hemithorax is noted with new increased density at the left lung base and perihilar zones.  A veiling density overlying the right lower lung zones suggests the possibility of a posteriorly layering effusion.  This appearance may  be the result of asymmetric pulmonary edema of new effusion with pneumonia or ARDS also possible.  IMPRESSION: Lower lung volumes with interval increase in bibasilar density and probable new posteriorly layering right effusion.  Stable support tubes  Original Report Authenticated By: Bertha Stakes, M.D.   Dg Chest Port 1 View  12/28/2011  *RADIOLOGY REPORT*  Clinical Data: Evaluate endotracheal tube placement  PORTABLE CHEST - 1 VIEW  Comparison: 12/27/2011; 12/26/2011; 12/25/2011  Findings:  Grossly unchanged cardiac silhouette and mediastinal contours. Stable positioning of support apparatus.  No pneumothorax. Continued minimal improved aeration of the right upper lung with persistent right upper and mid lung heterogeneous airspace opacities.  No new focal airspace opacities.  No definite pleural effusion.  Grossly unchanged bones.  IMPRESSION: 1.  Stable positioning of support apparatus.  No pneumothorax. 2.  Continued improved aeration of right upper lung with persistent right upper and mid lung heterogeneous air space opacities worrisome for infection.  Original Report Authenticated By: Waynard Reeds, M.D.   Dg Chest Port 1 View  12/27/2011  *RADIOLOGY REPORT*  Clinical Data: Endotracheal tube placement. Shortness of breath.  PORTABLE CHEST - 1 VIEW  Comparison: 12/26/2011.  Findings: Rotation to the left.  Endotracheal tube tip 5.4 cm above the carina.  Right central line tip mid superior vena cava level. Nasogastric tube courses below the diaphragm.  The tip is not included on this exam.   Asymmetric air space disease greater on the right most notable in the right upper lobe without significant change.  Appearance suggestive of infectious infiltrate superimposed upon pulmonary vascular congestion.  Recommend follow-up until clearance.  No gross pneumothorax.  Heart size within normal limits.  IMPRESSION: Endotracheal tube tip 5.4 cm above the carina.  Asymmetric air space disease without significant change.  Please see above.  Original Report Authenticated By: Fuller Canada, M.D.   Dg Chest Port 1 View  12/26/2011  *RADIOLOGY REPORT*  Clinical Data: Evaluate endotracheal tube.  PORTABLE CHEST - 1 VIEW  Comparison: 12/25/2011  Findings: Endotracheal tube is 6.8 cm above the carina.  Jugular central venous catheter the SVC region.  The airspace disease in the right lung has slightly improved, particularly in the lower aspect.  Heart and mediastinum are stable.  Nasogastric tube extends into the abdomen.  There are vague hazy densities in the left perihilar region.  No evidence for a pneumothorax.  IMPRESSION: Decreasing airspace disease in the right lung.  Support apparatuses as described.  Original Report Authenticated By: Richarda Overlie, M.D.   Dg Chest Port 1 View  12/25/2011  *RADIOLOGY REPORT*  Clinical Data: Evaluate endotracheal tube  PORTABLE CHEST - 1 VIEW  Comparison: 12/24/2011;  Findings: Grossly unchanged cardiac silhouette and mediastinal contours.  Stable positioning of support apparatus including endotracheal tube overlying tracheal air column with tip approximately 7.6 cm from the carina.  No pneumothorax.  No change to minimal increase in consolidation of right upper and mid lung airspace opacities.  The left hemithorax is unchanged.  No new focal airspace opacities.  No definite pleural effusion.  Grossly unchanged bones.  IMPRESSION: 1.  Stable positioning of support apparatus.  No pneumothorax. 2.  No change to minimal increase in consolidation of air space opacities involving  the right upper and mid lung, with differential considerations including infection, aspiration or less likely pulmonary hemorrhage.  Original Report Authenticated By: Waynard Reeds, M.D.   Dg Chest Port 1 View  12/24/2011  *RADIOLOGY REPORT*  Clinical Data:  Central line placement.  PORTABLE CHEST - 1 VIEW  Comparison: 12/24/2011 at 2054 hours  Findings: Endotracheal tube tip measures about 5.6 cm above the carina.  Interval placement of right central venous catheter with tip over the mid SVC region.  No pneumothorax.  Bilateral perihilar airspace infiltrates with slight improvement since previous study. No blunting of costophrenic angles.  Normal heart size and pulmonary vascularity.  IMPRESSION: Right central venous catheter placed with tip over the mid SVC region.  No pneumothorax.  Endotracheal and enteric tubes remain in place.  Bilateral perihilar and upper lung airspace disease.  Original Report Authenticated By: Marlon Pel, M.D.   Dg Chest Portable 1 View  12/24/2011  *RADIOLOGY REPORT*  Clinical Data: The patient found unresponsive.  Status post CPR.  PORTABLE CHEST - 1 VIEW  Comparison: None.  Findings: Shallow inspiration.  Normal heart size.  Diffuse bilateral perihilar and upper lobe airspace disease.  Changes could represent infection, aspiration, edema, or ARDS.  No evidence of pleural effusion or pneumothorax.  Endotracheal tube is in place with tip about 4.9 cm above the carina.  An enteric tube is in place with tip not visualized but below the level of the left hemidiaphragm consistent with location at least in the stomach.  No obvious displaced rib fractures.  IMPRESSION: Endotracheal tube tip is 4.9 cm above the carina.  Enteric tube tip is not visualized but is below the left hemidiaphragm.  Bilateral perihilar and upper lung airspace disease.  Original Report Authenticated By: Marlon Pel, M.D.   Dg Abd Portable 1v  01/05/2012  *RADIOLOGY REPORT*  Clinical Data: Panda  tube placement  PORTABLE ABDOMEN - 1 VIEW  Comparison: Jan 04, 2012  Findings: The panda tube tip is coiled within the body of the stomach.  The stool and bowel gas pattern is within normal limits with no evidence of obstruction or gross pneumoperitoneum.  IMPRESSION: Panda tube tip in gastric body.  Original Report Authenticated By: Brandon Melnick, M.D.   Dg Abd Portable 1v  01/04/2012  *RADIOLOGY REPORT*  Clinical Data: Panda tube placement.  PORTABLE ABDOMEN - 1 VIEW  Comparison: None  Findings: The Panda tube tip is in the fundal region of the stomach laterally.  IMPRESSION: Panda tube tip in the fundal region of the stomach.  Original Report Authenticated By: P. Loralie Champagne, M.D.    ASSESSMENT AND PLAN  Active Problems:  Acute respiratory failure  Pneumonia, organism unspecified  Acidosis  Altered mental status  Cardiac arrest  Anoxic encephalopathy  Thrombocytopenia  Cardiomyopathy secondary    Improving overall. Being weaned from ventilator. He has sinus tach and with his LV dysfunction we'll add beta blocker in the form of metoprolol 5 mg IV every 6 hours. No further cardiac input today. Other plans per critical care.  Signed, Valera Castle MD

## 2012-01-06 NOTE — Progress Notes (Signed)
Pt jumped over the bed and landed back on the floor, pulling out his internal jugular line, peripheral IV line and panda tube in the process. The bed  had all 4 sides rails with exit bed alarm armed. Pt is post [place] sun and act impulsively. Vitals signs were stable and pt was able to move all four extremities. MD informed and attempts made to contact family about the incident. Orders will be carried out per MD.

## 2012-01-07 LAB — BASIC METABOLIC PANEL
Calcium: 9.3 mg/dL (ref 8.4–10.5)
Chloride: 105 mEq/L (ref 96–112)
Creatinine, Ser: 0.91 mg/dL (ref 0.50–1.35)
GFR calc Af Amer: >90 mL/min (ref >90)
GFR calc non Af Amer: >90 mL/min (ref >90)

## 2012-01-07 LAB — CBC
MCH: 32.3 pg (ref 26.0–34.0)
MCHC: 33.4 g/dL (ref 30.0–36.0)
Platelets: 889 10*3/uL — ABNORMAL HIGH (ref 150–400)
RBC: 3.84 MIL/uL — ABNORMAL LOW (ref 4.22–5.81)
RDW: 13.6 % (ref 11.5–15.5)

## 2012-01-07 LAB — GLUCOSE, CAPILLARY: Glucose-Capillary: 112 mg/dL — ABNORMAL HIGH (ref 70–99)

## 2012-01-07 LAB — MAGNESIUM: Magnesium: 2.3 mg/dL (ref 1.5–2.5)

## 2012-01-07 LAB — HEPARIN LEVEL (UNFRACTIONATED): Heparin Unfractionated: 0.72 IU/mL — ABNORMAL HIGH (ref 0.30–0.70)

## 2012-01-07 MED ORDER — OXEPA PO LIQD
1000.0000 mL | ORAL | Status: DC
  Administered 2012-01-07 – 2012-01-11 (×5): 1000 mL
  Filled 2012-01-07 (×8): qty 1000

## 2012-01-07 MED ORDER — PRO-STAT SUGAR FREE PO LIQD
30.0000 mL | Freq: Four times a day (QID) | ORAL | Status: DC
  Administered 2012-01-07 – 2012-01-11 (×15): 30 mL
  Filled 2012-01-07 (×19): qty 30

## 2012-01-07 MED ORDER — METOPROLOL TARTRATE 1 MG/ML IV SOLN
5.0000 mg | INTRAVENOUS | Status: DC
  Administered 2012-01-07 – 2012-01-10 (×18): 5 mg via INTRAVENOUS
  Filled 2012-01-07 (×18): qty 5

## 2012-01-07 NOTE — Progress Notes (Signed)
Name: Angel [name] DOB: 1966-02-14 MRN: [id number] PCP: No primary provider on file. ADMIT DATE: 12/24/2011 LOS: 14  PCCM PROGRESS NOTE  Brief Patient Profile:45 YO man with no past medical history. His family states that he abuses some perscription drugs and some occasional alcohol. Smokes 2 PPD history. Daughter tried to call him all day but he was sleeping which is unusual for him. His friend found him shaking violently around 8 PM and called 911 who intubated in the field for agonal breathing. He was unresponsive at the time. He was brought to the ED and placed on hypothermia protocol. No ischemic EKG changes on ED arrival. UDS positive for cocaine and THC.    Best Practice: DVT: SQ Heparin >> 4/27 (due to low plat), ScD 4/7 and Argotraban (mod prob HIT) 4/27 > SUP: protonix Nutrition: Tube feed Glycemic control:  Sedation/analgesia: Cont sedation (off precedex 4/26)  Lines / Drains: ETT 4/21>>> 5/2 CVC 4/21>>> Janina Mayo Molli Knock) 5/2>>>  Micro: Blood 4/21>>>HAEMOPHILUS INFLUENZAE Urine 4/21>>>Neg  Sputum 4/21>>>GNR and GPC - NORMAL FLORA ....................... BC 4/27 NTD BAL 4/27 NTD  Urine 4/27 NTD BAL 4/30>>>NTD  Abx Vancomycin 4/21>>>5/1 Primaxin 4/24>>>  No results found for this basename: LATICACIDVEN:5,PCT:5 in the last 168 hours Recent Results (from the past 240 hour(s))  CULTURE, RESPIRATORY     Status: Normal   Collection Time   12/29/11 11:26 AM      Component Value Range Status Comment   Specimen Description OTHER   Final    Special Requests ETT SUCTION   Final    Gram Stain     Final    Value: FEW WBC PRESENT,BOTH PMN AND MONONUCLEAR     NO SQUAMOUS EPITHELIAL CELLS SEEN     NO ORGANISMS SEEN   Culture Non-Pathogenic Oropharyngeal-type Flora Isolated.   Final    Report Status 01/01/2012 FINAL   Final   CULTURE, BLOOD (ROUTINE X 2)     Status: Normal   Collection Time   12/30/11 11:08 AM      Component Value Range Status Comment   Specimen Description  BLOOD ARM RIGHT   Final    Special Requests BOTTLES DRAWN AEROBIC AND ANAEROBIC [facility]   Final    Culture  Setup Time [id number]   Final    Culture NO GROWTH 5 DAYS   Final    Report Status 01/05/2012 FINAL   Final   CULTURE, BLOOD (ROUTINE X 2)     Status: Normal   Collection Time   12/30/11 11:20 AM      Component Value Range Status Comment   Specimen Description BLOOD ARM RIGHT   Final    Special Requests BOTTLES DRAWN AEROBIC ONLY 10CC    Final    Culture  Setup Time [id number]   Final    Culture NO GROWTH 5 DAYS   Final    Report Status 01/05/2012 FINAL   Final   URINE CULTURE     Status: Normal   Collection Time   12/30/11 11:22 AM      Component Value Range Status Comment   Specimen Description URINE, CATHETERIZED   Final    Special Requests NONE   Final    Culture  Setup Time [id number]   Final    Colony Count NO GROWTH   Final    Culture NO GROWTH   Final    Report Status 12/31/2011 FINAL   Final   CULTURE, BAL-QUANTITATIVE     Status: Normal   Collection  Time   12/30/11 12:51 PM      Component Value Range Status Comment   Specimen Description BRONCHIAL ALVEOLAR LAVAGE   Final    Special Requests NONE   Final    Gram Stain     Final    Value: MODERATE WBC PRESENT, PREDOMINANTLY PMN     NO SQUAMOUS EPITHELIAL CELLS SEEN     NO ORGANISMS SEEN   Colony Count 2,000 COLONIES/ML   Final    Culture Non-Pathogenic Oropharyngeal-type Flora Isolated.   Final    Report Status 01/02/2012 FINAL   Final   AFB CULTURE WITH SMEAR     Status: Normal (Preliminary result)   Collection Time   01/02/12  3:05 PM      Component Value Range Status Comment   Specimen Description BRONCHIAL ALVEOLAR LAVAGE   Final    Special Requests ADDED 01/02/12 1552   Final    ACID FAST SMEAR NO ACID FAST BACILLI SEEN   Final    Culture     Final    Value: CULTURE WILL BE EXAMINED FOR 6 WEEKS BEFORE ISSUING A FINAL REPORT   Report Status PENDING   Incomplete   FUNGUS CULTURE W SMEAR     Status:  Normal (Preliminary result)   Collection Time   01/02/12  3:05 PM      Component Value Range Status Comment   Specimen Description BRONCHIAL ALVEOLAR LAVAGE   Final    Special Requests ADDED 01/02/12 1552   Final    Fungal Smear NO YEAST OR FUNGAL ELEMENTS SEEN   Final    Culture CULTURE IN PROGRESS FOR FOUR WEEKS   Final    Report Status PENDING   Incomplete   CULTURE, BAL-QUANTITATIVE     Status: Normal   Collection Time   01/02/12  3:05 PM      Component Value Range Status Comment   Specimen Description BRONCHIAL ALVEOLAR LAVAGE LUNG RIGHT LOWER   Final    Special Requests NONE   Final    Gram Stain     Final    Value: RARE WBC PRESENT,BOTH PMN AND MONONUCLEAR     NO SQUAMOUS EPITHELIAL CELLS SEEN     NO ORGANISMS SEEN   Colony Count NO GROWTH   Final    Culture NO GROWTH 2 DAYS   Final    Report Status 01/04/2012 FINAL   Final     Antibiotics: Anti-infectives     Start     Dose/Rate Route Frequency Ordered Stop   01/01/12 1200   imipenem-cilastatin (PRIMAXIN) 500 mg in sodium chloride 0.9 % 100 mL IVPB        500 mg 200 mL/hr over 30 Minutes Intravenous 4 times per day 01/01/12 0943     12/31/11 1400   imipenem-cilastatin (PRIMAXIN) 500 mg in sodium chloride 0.9 % 100 mL IVPB  Status:  Discontinued        500 mg 200 mL/hr over 30 Minutes Intravenous 3 times per day 12/31/11 1121 01/01/12 0943   12/31/11 1300   vancomycin (VANCOCIN) 1,250 mg in sodium chloride 0.9 % 250 mL IVPB  Status:  Discontinued        1,250 mg 166.7 mL/hr over 90 Minutes Intravenous Every 12 hours 12/31/11 1120 01/03/12 1105   12/25/11 0600   piperacillin-tazobactam (ZOSYN) IVPB 3.375 g  Status:  Discontinued        3.375 g 12.5 mL/hr over 240 Minutes Intravenous 3 times per day 12/24/11 2257 12/31/11 1028  12/24/11 2359   vancomycin (VANCOCIN) IVPB 1000 mg/200 mL premix  Status:  Discontinued        1,000 mg 200 mL/hr over 60 Minutes Intravenous Daily at bedtime 12/24/11 2301 12/29/11 1009    12/24/11 2045  piperacillin-tazobactam (ZOSYN) 3.375 g in dextrose 5 % 50 mL IVPB       3.375 g 100 mL/hr over 30 Minutes Intravenous [facility] 12/24/11 2011 12/24/11 2206   12/24/11 2015   vancomycin (VANCOCIN) IVPB 1000 mg/200 mL premix  Status:  Discontinued        1,000 mg 200 mL/hr over 60 Minutes Intravenous  Once 12/24/11 2011 12/24/11 2301         BMET    Component Value Date/Time   NA 142 01/07/2012 0545   K 3.9 01/07/2012 0545   CL 105 01/07/2012 0545   CO2 24 01/07/2012 0545   GLUCOSE 113* 01/07/2012 0545   BUN 50* 01/07/2012 0545   CREATININE 0.91 01/07/2012 0545   CALCIUM 9.3 01/07/2012 0545   GFRNONAA >90 01/07/2012 0545   GFRAA >90 01/07/2012 0545   CBC    Component Value Date/Time   WBC 14.6* 01/07/2012 0545   RBC 3.84* 01/07/2012 0545   HGB 12.4* 01/07/2012 0545   HCT 37.1* 01/07/2012 0545   PLT 889* 01/07/2012 0545   MCV 96.6 01/07/2012 0545   MCH 32.3 01/07/2012 0545   MCHC 33.4 01/07/2012 0545   RDW 13.6 01/07/2012 0545   LYMPHSABS 0.8 12/24/2011 2009   MONOABS 0.1 12/24/2011 2009   EOSABS 0.0 12/24/2011 2009   BASOSABS 0.0 12/24/2011 2009   Tests / Events:  4/27 - HIT panel negative, off angiomax back to heparin. 4/28: agitated on wUA but followed commands.   Consults: Neuro (signed off 5/1).  Subjective/Overnight/Interval History: No events overnight.  Vital Signs: Temp:  [98.4 F (36.9 C)-102 F (38.9 C)] 99.1 F (37.3 C) (05/05 0751) Pulse Rate:  [74-110] 74  (05/05 0600) Resp:  [19-44] 20  (05/05 0600) BP: (100-130)/(51-77) 130/70 mmHg (05/05 0700) SpO2:  [95 %-100 %] 99 % (05/05 0600) FiO2 (%):  [30 %-40 %] 30 % (05/05 0822) Weight:  [60.3 kg (132 lb 15 oz)] 60.3 kg (132 lb 15 oz) (05/05 0600) I/O last 3 completed shifts: In: 1032.4 [I.V.:529.9; NG/GT:202.5; IV Piggyback:300] Out: 2461 [Urine:2460; Emesis/NG output:1]  Intake/Output Summary (Last 24 hours) at 01/07/12 0900 Last data filed at 01/07/12 9811  Gross per 24 hour  Intake 224.35 ml    Output   1400 ml  Net -1175.65 ml   Physical Examination: General: Critically ill looking male. On PS.  Neuro: Extremely agitated on WUA. Moving all 4s. Cough +. Corneals +, REports that he followed commands +   HEENT:  corneals +. No jaundice Neck: Trach and clearn Cardiovascular:  Normal heart sounds.+ Chest: Cleart to ausculatation Lungs:  Normal chest Abdomen:  Soft, non tennder Musculoskeletal:   Normal joints Skin:  intact Extremities:No edema, no cyanosis, no clubbing.  Ventilator settings: Vent Mode:  [-] PRVC FiO2 (%):  [30 %-40 %] 30 % Set Rate:  [20 bmp] 20 bmp Vt Set:  [914 mL] 620 mL PEEP:  [5 cmH20] 5 cmH20 Plateau Pressure:  [14 cmH20-15 cmH20] 15 cmH20  Labs/Radiology: Please see A and P for full labs  Dg Abd 1 View  01/06/2012  *RADIOLOGY REPORT*  Clinical Data: Nausea, feeding tube placement  ABDOMEN - 1 VIEW  Comparison: 01/05/2012  Findings: Panda type feeding tube extends to the  gastric antrum. There is mild gaseous distention of the stomach and a few proximal small bowel loops.  Colon is decompressed.  Lower abdomen is excluded.  IMPRESSION:  1.  Panda to the gastric antrum.  Original Report Authenticated By: Osa Craver, M.D.   Ct Head Wo Contrast  01/06/2012  *RADIOLOGY REPORT*  Clinical Data: Nausea, vomiting, post fall  CT HEAD WITHOUT CONTRAST  Technique:  Contiguous axial images were obtained from the base of the skull through the vertex without contrast.  Comparison: 01/01/2012  Findings: Bilateral maxillary sinus disease, left worse than right. Nasogastric tube is partially seen. There is no evidence of acute intracranial hemorrhage, brain edema, mass lesion, acute infarction,   mass effect, or midline shift. Acute infarct may be inapparent on noncontrast CT.  No other intra-axial abnormalities are seen, and the ventricles and sulci are within normal limits in size and symmetry.   No abnormal extra-axial fluid collections or masses are  identified.  No significant calvarial abnormality.  IMPRESSION: 1. Negative for bleed or other acute intracranial process. 2.  Bilateral maxillary sinus disease.  Original Report Authenticated By: Osa Craver, M.D.   Dg Abd Portable 1v  01/05/2012  *RADIOLOGY REPORT*  Clinical Data: Panda tube placement  PORTABLE ABDOMEN - 1 VIEW  Comparison: Jan 04, 2012  Findings: The panda tube tip is coiled within the body of the stomach.  The stool and bowel gas pattern is within normal limits with no evidence of obstruction or gross pneumoperitoneum.  IMPRESSION: Panda tube tip in gastric body.  Original Report Authenticated By: Brandon Melnick, M.D.    ASSESSMENT AND PLAN 46 year old male with PMH of cocaine, etoh and drug abuse presenting after being found down in a hotel room positive for cocaine and THC. Patient was noted to have vomitus into his airway upon intubation. RUL and RML infiltrate noted. Patient was started on Abx and hypothermia as well ARDS protocol were started.  Neuro: following commands now and sedation needs decreasing but has a significant history of drug abuse and alcoholism.  EEG shows slowness but likely multifactorial with metabolic, septic and anoxic encephalopathy. Plan: D/C sedation.  PRN Haldol.  Cardiac: s/p cardiac arrest.  Shock resolved.  Off pressors. Plan: Hold diureses for today.  Monitor BP.  Cath once critical illness has resolved per cardiology.  Beta blockers for sinus tachycardia.  Pulmonary: began weaning today but clearly fluid overloaded and very agitated with evidence of drug withdrawal. Plan: Maintain on TC as tolerated including overnight.  F/U on BAL cultures from 4/30, NTD.  Titrate O2 down.  GI: protein calorie malnutrition.  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 Plan: Restart TF.  If able to stay off the vent x48 hours then will order swallow evaluation.  Renal:  No active issues, hold diureses today.  BMET in AM.  ID:  PNA, likely aspiration.  H flu in BAL. Plan: D/C vancomycin (completed 10 days)  Continue primaxin for 12/14 days.  Heme: DVT in bilateral upper ext and low platelet.  Neg HITT panel. Plan: Continue heparin.  Hold coumadin for now until PEG situation is assessed, if PEG is needed then will start coumadin after PEG placement.  Endo: ISS  Akima Slaugh 01/07/2012, 9:00 AM

## 2012-01-07 NOTE — Progress Notes (Signed)
[name] Resident Note  Subjective:  Patient awake and has no complains. No events over night. Trach during the day and vent during the night. Continues to be tachycardic   Objective:  Vital Signs in the last 24 hours: Filed Vitals:   01/07/12 0500 01/07/12 0600 01/07/12 0700 01/07/12 0751  BP: 115/72  130/70   Pulse: 103 74    Temp:    99.1 F (37.3 C)  TempSrc:    Oral  Resp: 24 20    Height:  6' (1.829 m)    Weight:  132 lb 15 oz (60.3 kg)    SpO2: 97% 99%      Intake/Output from previous day: 05/04 0701 - 05/05 0700 In: 271.4 [I.V.:171.4; IV Piggyback:100] Out: 1400 [Urine:1400]  24-hour weight change: Weight change:   Weight trends: Filed Weights   01/04/12 0300 01/05/12 0500 01/07/12 0600  Weight: 148 lb 13 oz (67.5 kg) 136 lb 3.9 oz (61.8 kg) 132 lb 15 oz (60.3 kg)    Physical Exam: Pt is alert and oriented, NAD Neck: Trach in place , No JVD, carotids 2+ equal without bruits Lungs: CTA bilaterally CV: RR without murmur or gallop Abd: soft, NT, Positive BS Ext: no C/C/E, distal pulses intact and equal   Lab Results:  Basename 01/07/12 0545 01/06/12 0730  WBC 14.6* 15.6*  HGB 12.4* 11.3*  PLT 889* 778*    Basename 01/07/12 0545 01/06/12 0730  NA 142 141  K 3.9 3.5  CL 105 102  CO2 24 25  GLUCOSE 113* 103*  BUN 50* 38*  CREATININE 0.91 0.69    Basename 01/07/12 0750 01/07/12 0342 01/06/12 2320 01/06/12 1906 01/06/12 1609 01/06/12 0757  GLUCAP 112* 122* 104* 111* 101* 109*    Tele:NSR  Scheduled Meds:   . antiseptic oral rinse  15 mL Mouth Rinse QID  . chlorhexidine  15 mL Mouth Rinse BID  . enalaprilat  0.625 mg Intravenous Q6H  . feeding supplement  30 mL Per Tube QID  . folic acid  1 mg Oral Daily  . free water  250 mL Per Tube Q4H  . haloperidol lactate  5 mg Intravenous Q6H  . imipenem-cilastatin  500 mg Intravenous Q6H  . insulin aspart  0-9 Units Subcutaneous Q4H  . metoprolol  5 mg Intravenous Q6H  .  mulitivitamin  5 mL Oral Daily  . pantoprazole sodium  40 mg Per Tube Q1200  . QUEtiapine  25 mg Oral BID  . sodium chloride  3 mL Intravenous Q12H  . thiamine  100 mg Oral Daily  . DISCONTD: ondansetron       Continuous Infusions:   . sodium chloride 5 mL/hr (01/03/12 0603)  . feeding supplement (OXEPA) 1,000 mL (01/05/12 2029)  . heparin 2,500 Units/hr (01/06/12 0926)   PRN Meds:.sodium chloride, sodium chloride, acetaminophen (TYLENOL) oral liquid 160 mg/5 mL, fentaNYL, midazolam, ondansetron, sodium chloride, DISCONTD: ondansetron  Imaging: Dg Abd 1 View  01/06/2012  *RADIOLOGY REPORT*  Clinical Data: Nausea, feeding tube placement  ABDOMEN - 1 VIEW  Comparison: 01/05/2012  Findings: Panda type feeding tube extends to the gastric antrum. There is mild gaseous distention of the stomach and a few proximal small bowel loops.  Colon is decompressed.  Lower abdomen is excluded.  IMPRESSION:  1.  Panda to the gastric antrum.  Original Report Authenticated By: Osa Craver, M.D.   Ct Head Wo Contrast  01/06/2012  *RADIOLOGY REPORT*  Clinical Data: Nausea, vomiting, post  fall  CT HEAD WITHOUT CONTRAST  Technique:  Contiguous axial images were obtained from the base of the skull through the vertex without contrast.  Comparison: 01/01/2012  Findings: Bilateral maxillary sinus disease, left worse than right. Nasogastric tube is partially seen. There is no evidence of acute intracranial hemorrhage, brain edema, mass lesion, acute infarction,   mass effect, or midline shift. Acute infarct may be inapparent on noncontrast CT.  No other intra-axial abnormalities are seen, and the ventricles and sulci are within normal limits in size and symmetry.   No abnormal extra-axial fluid collections or masses are identified.  No significant calvarial abnormality.  IMPRESSION: 1. Negative for bleed or other acute intracranial process. 2.  Bilateral maxillary sinus disease.  Original Report Authenticated By: Osa Craver, M.D.   Dg Abd Portable 1v  01/05/2012  *RADIOLOGY REPORT*  Clinical Data: Panda tube placement  PORTABLE ABDOMEN - 1 VIEW  Comparison: Jan 04, 2012  Findings: The panda tube tip is coiled within the body of the stomach.  The stool and bowel gas pattern is within normal limits with no evidence of obstruction or gross pneumoperitoneum.  IMPRESSION: Panda tube tip in gastric body.  Original Report Authenticated By: Brandon Melnick, M.D.   Assessment/Plan:   Patient is a 46 y.o. male with a PMHx of drug abuse, who was admitted to [facility] on 12/24/2011 for evaluation of cardiac arrest. Was placed on Artic Sun protocol.    Active Problems:  Acute respiratory failure - improved  Pneumonia, organism unspecified  Acidosis - improved Altered mental status -improved Cardiac arrest  Anoxic encephalopathy  Thrombocytopenia  Cardiomyopathy secondary   Improving overall. Tolerating off vent during the day.  Patient was started on Metoprolol 5 mg IV q6hrs on 5/4. Continues to be tachycardic may consider to increase Metoprolol tomorrow . No further cardiac input today. Other plans per critical care.  Length of Stay: 14 days  Patient history and plan of care reviewed with attending, Dr. Ambrose Mantle, M.D. 01/07/2012, 8:37 AM   Patient examined and agree except changes made. Increase Lopressor to 5mg  q 4hrs.  Valera Castle, MD 01/07/2012 10:00 AM

## 2012-01-07 NOTE — Progress Notes (Signed)
Report from Night RN. Chart reviewed together. Handoff complete.  

## 2012-01-07 NOTE — Progress Notes (Signed)
ANTICOAGULATION & ANTIBIOTIC CONSULT NOTE - Follow Up Consult  Pharmacy Consult for Heparin/Primaxin Indication:  Bilateral cephalic vein thrombosis/  Aspiration pneumonia coverage  Assessment: 45 yo male with significant history of IV drug abuse & bilateral cephalic vein thrombosis, continues on primaxin Day #7 (after 6 days of Zosyn) & IV heparin with slightly supratherapeutic level (HL=0.72), so will back down on rate a bit.  No bleeding issues reported.  Tm 102, WBC down to 14.6, SCr up a little to 0.91, BP 100-130/61-72 HR 74-110  Antibiotics: Vanc 4/22 >> 4/25, restart 4/28>>5/1 Zosyn 4/22 >>4/28 Imipenem 4/28>>  Cultures:  BCx 4/21: H flu x 1. Beta-lactamase neg, serotype pending UCx 4/21: neg Trach asp 4/21: normal flora Resp 4/26-nf UCx 4/27-ng BAL 4/27-nf BCx 4/27-ngtd   Goal of Therapy:  Heparin level 0.3-0.7 units/ml Primaxin dosed for renal function    Plan:  1. Decrease IV heparin rate to 2450 units/hr.  Check heparin level with AM labs. 2. Continue Primaxin 500mg  IV q6h for 3 more days.  Bayard Beaver. Saul Fordyce, PharmD Pager: [phone] 01/07/2012 9:13 AM   No Known Allergies  Patient Measurements: Height: 6' (182.9 cm) Weight: 132 lb 15 oz (60.3 kg) IBW/kg (Calculated) : 77.6  Heparin Dosing Weight: 62 kg  Vital Signs: Temp: 99.1 F (37.3 C) (05/05 0751) Temp src: Oral (05/05 0751) BP: 130/70 mmHg (05/05 0700) Pulse Rate: 74  (05/05 0600)  Labs:  Basename 01/07/12 0545 01/06/12 1623 01/06/12 0730 01/05/12 0254  HGB 12.4* -- 11.3* --  HCT 37.1* -- 35.1* 31.7*  PLT 889* -- 778* 551*  APTT -- -- -- --  LABPROT -- -- -- --  INR -- -- -- --  HEPARINUNFRC 0.72* 0.61 0.19* --  CREATININE 0.91 -- 0.69 0.80  CKTOTAL -- -- -- --  CKMB -- -- -- --  TROPONINI -- -- -- --   Estimated Creatinine Clearance: 85.6 ml/min (by C-G formula based on Cr of 0.91).

## 2012-01-08 DIAGNOSIS — I469 Cardiac arrest, cause unspecified: Secondary | ICD-10-CM

## 2012-01-08 DIAGNOSIS — G931 Anoxic brain damage, not elsewhere classified: Secondary | ICD-10-CM

## 2012-01-08 LAB — CBC
Hemoglobin: 12.4 g/dL — ABNORMAL LOW (ref 13.0–17.0)
MCV: 97.4 fL (ref 78.0–100.0)
Platelets: 955 10*3/uL (ref 150–400)
RBC: 3.9 MIL/uL — ABNORMAL LOW (ref 4.22–5.81)
WBC: 13.5 10*3/uL — ABNORMAL HIGH (ref 4.0–10.5)

## 2012-01-08 LAB — MAGNESIUM: Magnesium: 2.1 mg/dL (ref 1.5–2.5)

## 2012-01-08 LAB — HEPARIN LEVEL (UNFRACTIONATED): Heparin Unfractionated: 0.92 IU/mL — ABNORMAL HIGH (ref 0.30–0.70)

## 2012-01-08 LAB — BASIC METABOLIC PANEL
BUN: 43 mg/dL — ABNORMAL HIGH (ref 6–23)
Chloride: 108 mEq/L (ref 96–112)
GFR calc Af Amer: >90 mL/min (ref >90)
Potassium: 3.9 mEq/L (ref 3.5–5.1)

## 2012-01-08 LAB — PATHOLOGIST SMEAR REVIEW

## 2012-01-08 MED ORDER — SODIUM CHLORIDE 0.9 % IV SOLN
INTRAVENOUS | Status: DC
  Administered 2012-01-10: 500 mL via INTRAVENOUS
  Administered 2012-01-17: 20 mL/h via INTRAVENOUS

## 2012-01-08 NOTE — Progress Notes (Signed)
[facility] Resident Note  Subjective:  Events: Significant mucous , yellow in color per nurse. Increased RR.   Patient is awake and able to answer questions. Denies any chest pain or abdominal pain.   Objective:  Vital Signs in the last 24 hours: Filed Vitals:   01/08/12 0400 01/08/12 0500 01/08/12 0600 01/08/12 0625  BP: 124/70 123/66 134/73   Pulse: 100 98 104   Temp:      TempSrc:      Resp: 36 40 32   Height:      Weight:    133 lb 13.1 oz (60.7 kg)  SpO2: 95% 96% 97%     Intake/Output from previous day: 05/05 0701 - 05/06 0700 In: 1400.1 [I.V.:650.1; NG/GT:450; IV Piggyback:300] Out: 1725 [Urine:1725]  24-hour weight change: Weight change: 14.1 oz (0.4 kg)  Weight trends: Filed Weights   01/05/12 0500 01/07/12 0600 01/08/12 0625  Weight: 136 lb 3.9 oz (61.8 kg) 132 lb 15 oz (60.3 kg) 133 lb 13.1 oz (60.7 kg)    Physical Exam: Pt is alert and oriented, NAD Neck: Trach in place. No JVD, carotids 2+ equal without bruits Lungs: rhonchi present throughout CV: RRR . Difficult access due to diffuse rhonchi.  Abd: soft, NT, Positive BS, Ext: no C/C/E, distal pulses intact and equal Neuro: Awake and alert  Lab Results:  Basename 01/08/12 0517 01/07/12 0545  WBC 13.5* 14.6*  HGB 12.4* 12.4*  PLT 955* 889*    Basename 01/08/12 0517 01/07/12 0545  NA 144 142  K 3.9 3.9  CL 108 105  CO2 25 24  GLUCOSE 112* 113*  BUN 43* 50*  CREATININE 0.75 0.91   No results found for this basename: TROPONINI:2,CK,MB:2 in the last 72 hours  Basename 01/07/12 0750 01/07/12 0342 01/06/12 2320 01/06/12 1906 01/06/12 1609 01/06/12 0757  GLUCAP 112* 122* 104* 111* 101* 109*   No results found for this basename: PROBNP:3 in the last 72 hours Cardiac Studies:  Scheduled Meds:   . antiseptic oral rinse  15 mL Mouth Rinse QID  . enalaprilat  0.625 mg Intravenous Q6H  . feeding supplement  30 mL Per Tube QID  . folic acid  1 mg Oral Daily  . haloperidol  lactate  5 mg Intravenous Q6H  . imipenem-cilastatin  500 mg Intravenous Q6H  . metoprolol  5 mg Intravenous Q4H  . mulitivitamin  5 mL Oral Daily  . pantoprazole sodium  40 mg Per Tube Q1200  . QUEtiapine  25 mg Oral BID  . sodium chloride  3 mL Intravenous Q12H  . thiamine  100 mg Oral Daily  . DISCONTD: chlorhexidine  15 mL Mouth Rinse BID  . DISCONTD: feeding supplement  30 mL Per Tube QID  . DISCONTD: free water  250 mL Per Tube Q4H  . DISCONTD: insulin aspart  0-9 Units Subcutaneous Q4H  . DISCONTD: metoprolol  5 mg Intravenous Q6H   Continuous Infusions:   . sodium chloride 10 mL/hr at 01/07/12 2245  . feeding supplement (OXEPA) 1,000 mL (01/07/12 1400)  . heparin 2,300 Units/hr (01/08/12 2353)  . DISCONTD: feeding supplement (OXEPA) 1,000 mL (01/05/12 2029)   PRN Meds:.sodium chloride, sodium chloride, acetaminophen (TYLENOL) oral liquid 160 mg/5 mL, fentaNYL, midazolam, ondansetron, sodium chloride  Imaging: No results found. Assessment/Plan:   Patient is a 46 y.o. male with a PMHx of drug abuse, who was admitted to [facility] on 12/24/2011 for evaluation of cardiac arrest. Was placed on Artic Sun protocol.  Active Problems:  Acute respiratory failure - improved  Pneumonia, organism unspecified  Acidosis - improved  Altered mental status -improved  Cardiac arrest  Anoxic encephalopathy  Thrombocytopenia  Cardiomyopathy secondary   1. Improving overall. Tolerating off vent during the day. Patient was started on Metoprolol 5 mg IV q6hrs on 5/4 which was increased to 5 mg IV q 4hrs. Sinus tachycardia improved .  BP is tolerating it. Currently still on Heparin.    2. Platelets elevated to 955 < 889 <778<551. Refer to PCCM     Length of Stay: 15 days  Patient history and plan of care reviewed with attending, Dr. Riley Kill.   Almyra Deforest, M.D. 01/08/2012, 7:26 AM  Patient seen and examined with Dr. Arloa Koh.  He seems improved.  Able to answer questions.  Exam is as  outlined.  HR is improved.  Lungs are actually quite clear.   Rec:  1.  Repeat 2D echo to see if LVEF has improved.           2.  Suggest heme consult----Plt count is rising, with an unclear etiology           3.  Continue IV heparin for now.   Shawnie Pons 7:53 AM 01/08/2012

## 2012-01-08 NOTE — Care Management Note (Signed)
    Page 1 of 1   01/09/2012     3:50:43 PM   CARE MANAGEMENT NOTE 01/09/2012  Patient:  Angel [name], Angel [name]   Account Number:  [id number]  Date Initiated:  12/26/2011  Documentation initiated by:  [facility]  Subjective/Objective Assessment:   cardiac arrest - UDS positive.  Lives alone.     Action/Plan:   Anticipated DC Date:  01/11/2012   Anticipated DC Plan:  IP REHAB FACILITY  In-house referral  Clinical Social Worker      DC Planning Services  CM consult      Choice offered to / List presented to:             Status of service:  In process, will continue to follow Medicare Important Message given?   (If response is "NO", the following Medicare IM given date fields will be blank) Date Medicare IM given:   Date Additional Medicare IM given:    Discharge Disposition:    Per UR Regulation:  Reviewed for med. necessity/level of care/duration of stay  If discussed at Long Length of Stay Meetings, dates discussed:    Comments:  01/09/12 Saajan Willmon,RN,BSN [id number] NOTIFIED BY IP REHAB ADMISSIONS COORDINATOR THAT PT IS NOT A CANDIDATE FOR CIR.  HE WILL NEED SNF AT DISCHARGE. NOTIFIED CSW TO FACILITATE DC TO SNF WHEN MEDICALLY STABLE.  01/08/12 Mairead Schwarzkopf,RN,BSN INPATIENT REHAB MD TO EVALUATE FOR POSSIBLE ADMISSION.

## 2012-01-08 NOTE — Progress Notes (Signed)
Clinical Social Worker introduced self to pt, pt currently being moved to 2600.  CSW to continue to follow and meet with pt when more appropriate.    Angelia Mould, MSW, [place] [phone]

## 2012-01-08 NOTE — Progress Notes (Signed)
I will begin discussions with patient's children and Mom of rehab venue options. Options will depend on caregiver supports available at discharge. Noted financial counselor has begun medicaid and disability applications. Please call [phone] with questions.

## 2012-01-08 NOTE — Progress Notes (Signed)
ANTICOAGULATION CONSULT NOTE - Follow Up Consult  Pharmacy Consult for Heparin Indication: Bilateral cephalic vein thrombosis  No Known Allergies  Patient Measurements: Height: 6' (182.9 cm) Weight: 133 lb 13.1 oz (60.7 kg) IBW/kg (Calculated) : 77.6  Heparin Dosing Weight: 62 kg Vital Signs: Temp: 98.5 F (36.9 C) (05/06 1200) Temp src: Oral (05/06 1200) BP: 122/62 mmHg (05/06 1300) Pulse Rate: 82  (05/06 1300) Labs:  Basename 01/08/12 1208 01/08/12 0517 01/07/12 0545 01/06/12 0730  HGB -- 12.4* 12.4* --  HCT -- 38.0* 37.1* 35.1*  PLT -- 955* 889* 778*  APTT -- -- -- --  LABPROT -- -- -- --  INR -- -- -- --  HEPARINUNFRC 0.92* 0.99* 0.72* --  CREATININE -- 0.75 0.91 0.69  CKTOTAL -- -- -- --  CKMB -- -- -- --  TROPONINI -- -- -- --   Estimated Creatinine Clearance: 98 ml/min (by C-G formula based on Cr of 0.75).  Assessment: 46 yo male with significant history of IV drug abuse & bilateral cephalic vein thrombosis.   Heparin level (0.92) is above-goal despite rate decrease to 2300 units/hr. Heparin level drawn correctly. No bleeding reported. H/H ok, Plts 955.   Goal of Therapy:  Heparin level 0.3-0.7 units/ml   Plan:  1. Decrease IV heparin to 2150 units/hr (21.5 ml/hr). No bolus.  2. Heparin level in 6 hours.   Link Snuffer, PharmD, BCPS Clinical Pharmacist [phone] 01/08/2012,1:08 PM

## 2012-01-08 NOTE — Plan of Care (Signed)
Problem: Phase II Progression Outcomes Goal: Extubated and maintains O2 sats > 92% Unable to wean.  Outcome: Progressing Trach collar with humidified oxygen

## 2012-01-08 NOTE — Progress Notes (Signed)
ANTICOAGULATION CONSULT NOTE - Follow Up Consult  Pharmacy Consult for Heparin/Primaxin Indication:  Bilateral cephalic vein thrombosis  Assessment: 46 yo male with significant history of IV drug abuse & bilateral cephalic vein thrombosis. Heparin level (0.99) is above-goal despite rate decrease to 2450 units/hr. Confirmed with RN that heparin level was drawn appropriately. Per RN no bleeding issues.   Goal of Therapy:  Heparin level 0.3-0.7 units/ml   Plan:  1. Decrease IV heparin to 2300 units/hr. 2. Heparin level in 6 hours.  Lorre Munroe, PharmD 01/08/2012 6:29 AM   No Known Allergies  Patient Measurements: Height: 6' (182.9 cm) Weight: 133 lb 13.1 oz (60.7 kg) IBW/kg (Calculated) : 77.6  Heparin Dosing Weight: 62 kg  Vital Signs: Temp: 99.6 F (37.6 C) (05/06 0359) Temp src: Oral (05/06 0359) BP: 128/65 mmHg (05/06 0200) Pulse Rate: 94  (05/06 0200)  Labs:  Basename 01/08/12 0517 01/07/12 0545 01/06/12 1623 01/06/12 0730  HGB 12.4* 12.4* -- --  HCT 38.0* 37.1* -- 35.1*  PLT 955* 889* -- 778*  APTT -- -- -- --  LABPROT -- -- -- --  INR -- -- -- --  HEPARINUNFRC 0.99* 0.72* 0.61 --  CREATININE 0.75 0.91 -- 0.69  CKTOTAL -- -- -- --  CKMB -- -- -- --  TROPONINI -- -- -- --   Estimated Creatinine Clearance: 98 ml/min (by C-G formula based on Cr of 0.75).

## 2012-01-08 NOTE — Progress Notes (Signed)
ANTICOAGULATION CONSULT NOTE - Follow Up Consult  Pharmacy Consult for Heparin Indication: Bilateral cephalic vein thrombosis  No Known Allergies  Patient Measurements: Height: 6' (182.9 cm) Weight: 133 lb 13.1 oz (60.7 kg) IBW/kg (Calculated) : 77.6  Heparin Dosing Weight: 62 kg  Vital Signs: Temp: 98.7 F (37.1 C) (05/06 2003) Temp src: Axillary (05/06 2003) BP: 109/63 mmHg (05/06 2003) Pulse Rate: 75  (05/06 2003) Labs:  Basename 01/08/12 1914 01/08/12 1208 01/08/12 0517 01/07/12 0545 01/06/12 0730  HGB -- -- 12.4* 12.4* --  HCT -- -- 38.0* 37.1* 35.1*  PLT -- -- 955* 889* 778*  APTT -- -- -- -- --  LABPROT -- -- -- -- --  INR -- -- -- -- --  HEPARINUNFRC 0.73* 0.92* 0.99* -- --  CREATININE -- -- 0.75 0.91 0.69  CKTOTAL -- -- -- -- --  CKMB -- -- -- -- --  TROPONINI -- -- -- -- --   Estimated Creatinine Clearance: 98 ml/min (by C-G formula based on Cr of 0.75).  Assessment: 46 yo male with significant history of IV drug abuse & bilateral cephalic vein thrombosis.   Heparin level (0.73) is barely above-goal despite rate decrease to 2150 units/hr. Heparin level drawn correctly. No bleeding reported. H/H ok, Plts 955.   Goal of Therapy:  Heparin level 0.3-0.7 units/ml   Plan:  1. Decrease IV heparin to 2050 units/hr (20.5 ml/hr). 2. F/u a.m. Heparin level.   Christoper Fabian, PharmD, BCPS Clinical pharmacist, pager [phone] 01/08/2012,8:29 PM

## 2012-01-08 NOTE — Progress Notes (Signed)
Patient was able to stay on 40% trach collar all night. Sp02 levels stayed above 97%. Patient at times did have increased respiratory rate. Patient did have increased amount of secretions of thick cloudy sputum.

## 2012-01-08 NOTE — Progress Notes (Signed)
Nutrition Follow-up  Diet Order:  NPO Pt with continued agitation when not sedated, placed trach on 5/2. Pt on trach during the day and vent at night, transitioning to trach all day and night. 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 Also, if able to stay off vent for x 48 hours will have swallow evaluation. If not, discussion about PEG.  Currently remains on Oxepa via panda tube. Rate of 45 ml/hr and 30 ml Pro-stat 4 times daily. This is providing 2020 kcal and 128 gm protein.   Meds: Scheduled Meds:   . antiseptic oral rinse  15 mL Mouth Rinse QID  . enalaprilat  0.625 mg Intravenous Q6H  . feeding supplement  30 mL Per Tube QID  . folic acid  1 mg Oral Daily  . haloperidol lactate  5 mg Intravenous Q6H  . imipenem-cilastatin  500 mg Intravenous Q6H  . metoprolol  5 mg Intravenous Q4H  . mulitivitamin  5 mL Oral Daily  . pantoprazole sodium  40 mg Per Tube Q1200  . QUEtiapine  25 mg Oral BID  . sodium chloride  3 mL Intravenous Q12H  . thiamine  100 mg Oral Daily  . DISCONTD: chlorhexidine  15 mL Mouth Rinse BID  . DISCONTD: feeding supplement  30 mL Per Tube QID  . DISCONTD: free water  250 mL Per Tube Q4H  . DISCONTD: insulin aspart  0-9 Units Subcutaneous Q4H   Continuous Infusions:   . sodium chloride 10 mL/hr at 01/07/12 2245  . feeding supplement (OXEPA) 1,000 mL (01/07/12 1400)  . heparin 2,300 Units/hr (01/08/12 4098)  . DISCONTD: feeding supplement (OXEPA) 1,000 mL (01/05/12 2029)   PRN Meds:.sodium chloride, sodium chloride, acetaminophen (TYLENOL) oral liquid 160 mg/5 mL, fentaNYL, midazolam, ondansetron, sodium chloride  Labs:  CMP     Component Value Date/Time   NA 144 01/08/2012 0517   K 3.9 01/08/2012 0517   CL 108 01/08/2012 0517   CO2 25 01/08/2012 0517   GLUCOSE 112* 01/08/2012 0517   BUN 43* 01/08/2012 0517   CREATININE 0.75 01/08/2012 0517   CALCIUM 9.5 01/08/2012 0517   PROT 5.8* 01/02/2012 0430   ALBUMIN 2.0* 01/02/2012  0430   AST 34 01/02/2012 0430   ALT 75* 01/02/2012 0430   ALKPHOS 186* 01/02/2012 0430   BILITOT 0.5 01/02/2012 0430   GFRNONAA >90 01/08/2012 0517   GFRAA >90 01/08/2012 0517     Intake/Output Summary (Last 24 hours) at 01/08/12 1006 Last data filed at 01/08/12 0700  Gross per 24 hour  Intake 1426.5 ml  Output   2125 ml  Net -698.5 ml    Weight Status:  133 lbs, trending down from admission weight, down 21 lbs in 15 days. Some related to negative fluid balance. MD notes pt is fluid overloaded. No family present to discuss usual weight with.   Re-estimated needs:  1800-2000 kcal, 90-110 gm protein  Nutrition Dx:  Inadequate oral intake, ongoing  Goal:  TF to meet > 90% of estimated nutrition needs, met  Intervention:   1. Will maintain TF regimen and await swallow evaluation or change to PEG tube 2. RD will continue to follow  Monitor:  Swallow evaluation, PEG placement, weight, labs, I/O's   Rudean Haskell Pager #:  [phone]

## 2012-01-08 NOTE — Progress Notes (Signed)
Name: Angel [name] DOB: April 07, 1966 MRN: [id number] PCP: No primary provider on file. ADMIT DATE: 12/24/2011 LOS: 15  PCCM PROGRESS NOTE  Brief Patient Profile:45 YO man with no past medical history. His family states that he abuses some perscription drugs and some occasional alcohol. Smokes 2 PPD history. Daughter tried to call him all day but he was sleeping which is unusual for him. His friend found him shaking violently around 8 PM and called 911 who intubated in the field for agonal breathing. He was unresponsive at the time. He was brought to the ED and placed on hypothermia protocol. No ischemic EKG changes on ED arrival. UDS positive for cocaine and THC.    Best Practice: DVT: SQ Heparin >> 4/27 (due to low plat), ScD 4/7 and Argotraban (mod prob HIT) 4/27 > SUP: protonix Nutrition: Tube feed Glycemic control:  Sedation/analgesia: Cont sedation (off precedex 4/26)  Lines / Drains: ETT 4/21>>> 5/2 CVC 4/21>>> Janina Mayo Molli Knock) 5/2>>>  Micro: Blood 4/21>>>HAEMOPHILUS INFLUENZAE Urine 4/21>>>Neg  Sputum 4/21>>>GNR and GPC - NORMAL FLORA ....................... BC 4/27 NTD BAL 4/27 NTD  Urine 4/27 NTD BAL 4/30>>>NTD  Abx Vancomycin 4/21>>>5/1 Primaxin 4/24>>>  No results found for this basename: LATICACIDVEN:5,PCT:5 in the last 168 hours Recent Results (from the past 240 hour(s))  CULTURE, BLOOD (ROUTINE X 2)     Status: Normal   Collection Time   12/30/11 11:08 AM      Component Value Range Status Comment   Specimen Description BLOOD ARM RIGHT   Final    Special Requests BOTTLES DRAWN AEROBIC AND ANAEROBIC [facility]   Final    Culture  Setup Time [id number]   Final    Culture NO GROWTH 5 DAYS   Final    Report Status 01/05/2012 FINAL   Final   CULTURE, BLOOD (ROUTINE X 2)     Status: Normal   Collection Time   12/30/11 11:20 AM      Component Value Range Status Comment   Specimen Description BLOOD ARM RIGHT   Final    Special Requests BOTTLES DRAWN AEROBIC ONLY  10CC    Final    Culture  Setup Time [id number]   Final    Culture NO GROWTH 5 DAYS   Final    Report Status 01/05/2012 FINAL   Final   URINE CULTURE     Status: Normal   Collection Time   12/30/11 11:22 AM      Component Value Range Status Comment   Specimen Description URINE, CATHETERIZED   Final    Special Requests NONE   Final    Culture  Setup Time [id number]   Final    Colony Count NO GROWTH   Final    Culture NO GROWTH   Final    Report Status 12/31/2011 FINAL   Final   CULTURE, BAL-QUANTITATIVE     Status: Normal   Collection Time   12/30/11 12:51 PM      Component Value Range Status Comment   Specimen Description BRONCHIAL ALVEOLAR LAVAGE   Final    Special Requests NONE   Final    Gram Stain     Final    Value: MODERATE WBC PRESENT, PREDOMINANTLY PMN     NO SQUAMOUS EPITHELIAL CELLS SEEN     NO ORGANISMS SEEN   Colony Count 2,000 COLONIES/ML   Final    Culture Non-Pathogenic Oropharyngeal-type Flora Isolated.   Final    Report Status 01/02/2012 FINAL   Final   AFB  CULTURE WITH SMEAR     Status: Normal (Preliminary result)   Collection Time   01/02/12  3:05 PM      Component Value Range Status Comment   Specimen Description BRONCHIAL ALVEOLAR LAVAGE   Final    Special Requests ADDED 01/02/12 1552   Final    ACID FAST SMEAR NO ACID FAST BACILLI SEEN   Final    Culture     Final    Value: CULTURE WILL BE EXAMINED FOR 6 WEEKS BEFORE ISSUING A FINAL REPORT   Report Status PENDING   Incomplete   FUNGUS CULTURE W SMEAR     Status: Normal (Preliminary result)   Collection Time   01/02/12  3:05 PM      Component Value Range Status Comment   Specimen Description BRONCHIAL ALVEOLAR LAVAGE   Final    Special Requests ADDED 01/02/12 1552   Final    Fungal Smear NO YEAST OR FUNGAL ELEMENTS SEEN   Final    Culture CULTURE IN PROGRESS FOR FOUR WEEKS   Final    Report Status PENDING   Incomplete   CULTURE, BAL-QUANTITATIVE     Status: Normal   Collection Time   01/02/12  3:05  PM      Component Value Range Status Comment   Specimen Description BRONCHIAL ALVEOLAR LAVAGE LUNG RIGHT LOWER   Final    Special Requests NONE   Final    Gram Stain     Final    Value: RARE WBC PRESENT,BOTH PMN AND MONONUCLEAR     NO SQUAMOUS EPITHELIAL CELLS SEEN     NO ORGANISMS SEEN   Colony Count NO GROWTH   Final    Culture NO GROWTH 2 DAYS   Final    Report Status 01/04/2012 FINAL   Final     Antibiotics: Anti-infectives     Start     Dose/Rate Route Frequency Ordered Stop   01/01/12 1200   imipenem-cilastatin (PRIMAXIN) 500 mg in sodium chloride 0.9 % 100 mL IVPB        500 mg 200 mL/hr over 30 Minutes Intravenous 4 times per day 01/01/12 0943     12/31/11 1400   imipenem-cilastatin (PRIMAXIN) 500 mg in sodium chloride 0.9 % 100 mL IVPB  Status:  Discontinued        500 mg 200 mL/hr over 30 Minutes Intravenous 3 times per day 12/31/11 1121 01/01/12 0943   12/31/11 1300   vancomycin (VANCOCIN) 1,250 mg in sodium chloride 0.9 % 250 mL IVPB  Status:  Discontinued        1,250 mg 166.7 mL/hr over 90 Minutes Intravenous Every 12 hours 12/31/11 1120 01/03/12 1105   12/25/11 0600   piperacillin-tazobactam (ZOSYN) IVPB 3.375 g  Status:  Discontinued        3.375 g 12.5 mL/hr over 240 Minutes Intravenous 3 times per day 12/24/11 2257 12/31/11 1028   12/24/11 2359   vancomycin (VANCOCIN) IVPB 1000 mg/200 mL premix  Status:  Discontinued        1,000 mg 200 mL/hr over 60 Minutes Intravenous Daily at bedtime 12/24/11 2301 12/29/11 1009   12/24/11 2045  piperacillin-tazobactam (ZOSYN) 3.375 g in dextrose 5 % 50 mL IVPB       3.375 g 100 mL/hr over 30 Minutes Intravenous [facility] 12/24/11 2011 12/24/11 2206   12/24/11 2015   vancomycin (VANCOCIN) IVPB 1000 mg/200 mL premix  Status:  Discontinued        1,000 mg  200 mL/hr over 60 Minutes Intravenous  Once 12/24/11 2011 12/24/11 2301         BMET    Component Value Date/Time   NA 144 01/08/2012 0517   K 3.9  01/08/2012 0517   CL 108 01/08/2012 0517   CO2 25 01/08/2012 0517   GLUCOSE 112* 01/08/2012 0517   BUN 43* 01/08/2012 0517   CREATININE 0.75 01/08/2012 0517   CALCIUM 9.5 01/08/2012 0517   GFRNONAA >90 01/08/2012 0517   GFRAA >90 01/08/2012 0517   CBC    Component Value Date/Time   WBC 13.5* 01/08/2012 0517   RBC 3.90* 01/08/2012 0517   HGB 12.4* 01/08/2012 0517   HCT 38.0* 01/08/2012 0517   PLT 955* 01/08/2012 0517   MCV 97.4 01/08/2012 0517   MCH 31.8 01/08/2012 0517   MCHC 32.6 01/08/2012 0517   RDW 13.5 01/08/2012 0517   LYMPHSABS 0.8 12/24/2011 2009   MONOABS 0.1 12/24/2011 2009   EOSABS 0.0 12/24/2011 2009   BASOSABS 0.0 12/24/2011 2009   Tests / Events:  4/27 - HIT panel negative, off angiomax back to heparin. 4/28: agitated on wUA but followed commands.   Consults: Neuro (signed off 5/1).  Subjective/Overnight/Interval History: No events overnight.  Vital Signs: Temp:  [98.3 F (36.8 C)-99.6 F (37.6 C)] 98.8 F (37.1 C) (05/06 0800) Pulse Rate:  [79-104] 81  (05/06 1131) Resp:  [13-40] 25  (05/06 1131) BP: (110-134)/(61-78) 118/73 mmHg (05/06 1131) SpO2:  [95 %-99 %] 95 % (05/06 1131) FiO2 (%):  [40 %] 40 % (05/06 1131) Weight:  [60.7 kg (133 lb 13.1 oz)] 60.7 kg (133 lb 13.1 oz) (05/06 0625) I/O last 3 completed shifts: In: 2036.1 [I.V.:1141.1; NG/GT:495; IV Piggyback:400] Out: 3525 [Urine:3125; Stool:400]  Intake/Output Summary (Last 24 hours) at 01/08/12 1144 Last data filed at 01/08/12 1100  Gross per 24 hour  Intake   1924 ml  Output   2085 ml  Net   -161 ml   Physical Examination: General: Critically ill looking male. On PS.  Neuro: Extremely agitated on WUA. Moving all 4s. Cough +. Corneals +, REports that he followed commands +   HEENT:  corneals +. No jaundice Neck: Trach and clearn Cardiovascular:  Normal heart sounds.+ Chest: Cleart to ausculatation Lungs:  Normal chest Abdomen:  Soft, non tennder Musculoskeletal:   Normal joints Skin:  intact Extremities:No edema,  no cyanosis, no clubbing.  Ventilator settings: Vent Mode:  [-]  FiO2 (%):  [40 %] 40 %  Labs/Radiology: Please see A and P for full labs  No results found.  ASSESSMENT AND PLAN 46 year old male with PMH of cocaine, etoh and drug abuse presenting after being found down in a hotel room positive for cocaine and THC. Patient was noted to have vomitus into his airway upon intubation. RUL and RML infiltrate noted. Patient was started on Abx and hypothermia as well ARDS protocol were started.  Neuro: following commands and now off sedation but has a significant history of drug abuse and alcoholism.  EEG shows slowness but likely multifactorial with metabolic, septic and anoxic encephalopathy. Plan: PRN Haldol.  Cardiac: s/p cardiac arrest.  Shock resolved.  Off pressors. Plan: Hold diureses for today.  Monitor BP.  Cath once critical illness has resolved per cardiology.  Beta blockers for sinus tachycardia.  Pulmonary: On TC overnight for the first time last night,will maintain on TC as tolerated. Plan: Maintain on TC as tolerated.  BAL cultures from 4/30, NTD.  Titrate O2 down.  PMV and swallow evaluation.  GI: protein calorie malnutrition.  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 Plan: Restart TF.  Swallow evaluation today, if ok then will start diet, if not then will need PEG.  Renal:  No active issues, hold diureses today.  BMET in AM.  ID: PNA, likely aspiration.  H flu in BAL. Plan: D/C vancomycin (completed 10 days)  Continue primaxin for 13/14 days.  Heme: DVT in bilateral upper ext and low platelet.  Neg HITT panel. Plan: Continue heparin.  Hold coumadin for now until PEG situation is assessed, if PEG is needed then will start coumadin after PEG placement.  Endo: ISS  Will transfer to SDU, if patient passes swallow evaluation will start diet and coumadin, if fails then will have IR place a PEG and start coumadin.  Social work consult for placement.   Patient is LTAC appropriate.  Ceylin Dreibelbis 01/08/2012, 11:44 AM

## 2012-01-08 NOTE — Evaluation (Signed)
Physical Therapy Evaluation Patient Details Name: Angel [name] MRN: [id number] DOB: 1966-04-26 Today's Date: 01/08/2012 Time: 4098-1191 PT Time Calculation (min): 23 min  PT Assessment / Plan / Recommendation Clinical Impression  Patient was found at home by friend who found him shaking violently and unresponsive they called 911 who intubated in the field for agonal breathing. UDS positive for cocaine and THC. Patients hospital course also significant for cardiac arrest, PNA, and DVT bil UE's. He has now been extubated. Patient will require intensive PT to maximize functional mobility to enable patient to return to active lifestyle in the future and decrease the burden of care on his family.     PT Assessment  Patient needs continued PT services    Follow Up Recommendations  Inpatient Rehab    Equipment Recommendations  Defer to next venue    Frequency Min 3X/week    Precautions / Restrictions Precautions Precautions: Fall   Pertinent Vitals/Pain VSS/no reports of pain      Mobility  Bed Mobility Bed Mobility: Rolling Right;Right Sidelying to Sit;Sitting - Scoot to Edge of Bed Rolling Right: 4: Min assist Right Sidelying to Sit: 3: Mod assist Sitting - Scoot to Edge of Bed: 3: Mod assist Details for Bed Mobility Assistance: Assistance to initiate and through range.  Transfers Transfers: Sit to Stand;Stand to [employer] Transfers Sit to Stand: 3: Mod assist;From bed Stand to Sit: 4: Min assist;To chair/3-in-1;With armrests Stand Pivot Transfers: 3: Mod assist Details for Transfer Assistance: Assistance for balance and safety secondary to weakness. Patient able to take pivotal steps. Controls descent well with assistance mostly for guidance only.    Exercises General Exercises - Lower Extremity Long Arc Quad: AROM;Both;5 reps;Seated Hip Flexion/Marching: AROM;Both;5 reps;Seated Toe Raises: AROM;Both;10 reps;Seated Heel Raises: AROM;Both;10 reps;Seated   PT  Goals Acute Rehab PT Goals PT Goal Formulation: With patient Time For Goal Achievement: 01/22/12 Potential to Achieve Goals: Good Pt will go Supine/Side to Sit: with supervision PT Goal: Supine/Side to Sit - Progress: Goal set today Pt will Sit at Edge of Bed: Independently;1-2 min;with no upper extremity support PT Goal: Sit at Edge Of Bed - Progress: Goal set today Pt will go Sit to Supine/Side: with supervision PT Goal: Sit to Supine/Side - Progress: Goal set today Pt will go Sit to Stand: with supervision;with upper extremity assist PT Goal: Sit to Stand - Progress: Goal set today Pt will go Stand to Sit: with supervision;with upper extremity assist PT Goal: Stand to Sit - Progress: Goal set today Pt will Transfer Bed to Chair/Chair to Bed: with supervision PT Transfer Goal: Bed to Chair/Chair to Bed - Progress: Goal set today Pt will Stand: with supervision;1 - 2 min;with no upper extremity support PT Goal: Stand - Progress: Goal set today Pt will Ambulate: 16 - 50 feet;with min assist;with least restrictive assistive device PT Goal: Ambulate - Progress: Goal set today Pt will Perform Home Exercise Program: Independently PT Goal: Perform Home Exercise Program - Progress: Goal set today  Visit Information  Last PT Received On: 01/08/12 Assistance Needed: +2    Subjective Data  Subjective: Patient reports weakness Patient Stated Goal: Return to work in the long term   Prior Functioning  Home Living Lives With: Alone Available Help at Discharge: Available PRN/intermittently;Family;Friend(s) Type of Home: House Home Access: Level entry Home Layout: One level Home Adaptive Equipment: None Prior Function Level of Independence: Independent Able to Take Stairs?: Yes Driving: No Vocation: Full time employment Communication Communication: Tracheostomy  Cognition  Overall Cognitive Status: Appears within functional limits for tasks assessed/performed Arousal/Alertness:  Awake/alert Orientation Level: Oriented X4 / Intact Behavior During Session: WFL for tasks performed    Extremity/Trunk Assessment Right Lower Extremity Assessment RLE ROM/Strength/Tone: Deficits RLE ROM/Strength/Tone Deficits: ROM WFL - grossly 3-4/5 for strength RLE Sensation: WFL - Light Touch RLE Coordination: WFL - gross/fine motor Left Lower Extremity Assessment LLE ROM/Strength/Tone: Deficits LLE ROM/Strength/Tone Deficits: ROM WFL - grossly 3-4/5 for strength LLE Sensation: WFL - Light Touch LLE Coordination: WFL - gross/fine motor Trunk Assessment Trunk Assessment: Normal   Balance Balance Balance Assessed: Yes Static Sitting Balance Static Sitting - Balance Support: Feet supported Static Sitting - Level of Assistance: 5: Stand by assistance Static Sitting - Comment/# of Minutes: VSS - slightly light headed with initial sit but no significant change in BP  End of Session PT - End of Session Equipment Utilized During Treatment: Gait belt Activity Tolerance: Patient limited by fatigue Patient left: in chair;with call bell/phone within reach Nurse Communication: Mobility status   Edwyna Perfect, PT  Pager [phone]  01/08/2012, 8:16 AM

## 2012-01-08 NOTE — Progress Notes (Signed)
  Echocardiogram 2D Echocardiogram has been performed.  Cathie Beams Deneen 01/08/2012, 10:24 AM

## 2012-01-08 NOTE — Evaluation (Addendum)
[facility] - Evaluation Patient Details  Name: Angel [name] MRN: [id number] Date of Birth: 1965/09/19  Today's Date: 01/08/2012 Time: 1345-1410 SLP Time Calculation (min): 25 min  Past Medical History:  Past Medical History  Diagnosis Date  . No significant past medical history    Past Surgical History: History reviewed. No pertinent past surgical history. HPI:  46 yr old admitted with cardiac arrest, + cocaine, likely drug overdose.  Intubated 4/21-5/2.  Trach and PEG 5/2.Marland Kitchen   Assessment / Plan / Recommendation Clinical Impression  Pt. has a # 8 cuffed Shiley with cuff currently deflated with thick and white secretions.  Pt. tolerated PMSV placement for 20-60 second intervals.  Evidence of C02 retention and inadequate movement or air present x 1 that was absent by end of assessment.  Inadequate respiratory support and coordination for phonation.  He achieved a wet vocal quality with one word and short phrase reponses and became slightly irriated and stated "I can't do this today."  Prognosis for functional verbalization using PMSV is good.  Recommend downsize to # 6 cuffless trach to achieve optimal air movement to upper airway.    SLP Assessment  Patient needs continued Speech Lanaguage Pathology Services    Follow Up Recommendations  Inpatient Rehab    Frequency and Duration min 3x week      Pertinent Vitals/Pain     SLP Goals Potential to Achieve Goals: Good Potential Considerations: Financial resources Progress/Goals/Alternative treatment plan discussed with pt/caregiver and they: Agree SLP Goal #1: Pt. will increase coordination of respiration and phonation for adequate verbalization in sentences using PMSV with mild verbal/visual cues SLP Goal #2: Pt. will use compensatory techniques to increase speech intelligibility at the sentence level with min verbal cues.   PMSV Trial  PMSV was placed for: phonation, oral expectoration of secrections, eventual aid  for swallowing Able to redirect subglottic air through upper airway: Yes Able to Attain Phonation: Yes Voice Quality: Wet;Breathy Able to Expectorate Secretions: Yes Level of Secretion Expectoration with PMSV: Tracheal Breath Support for Phonation: Moderately decreased Intelligibility: Intelligible (FOR ONE WORD) Respirations During Trial: 34  SpO2 During Trial: 94 % Behavior: Alert (INTERMITTENTLY IRRITATED)   Tracheostomy Tube       Vent Dependency  FiO2 (%): 40 %    Cuff Deflation Trial  Breck Coons Anastasio Wogan M.Ed CCC-SLP Pager [phone]  01/08/2012      Royce Macadamia 01/08/2012, 2:42 PM

## 2012-01-08 NOTE — Plan of Care (Signed)
Problem: Phase III Progression Outcomes Goal: Tolerating diet Variance: Physical/mental limitations

## 2012-01-08 NOTE — Consult Note (Signed)
Physical Medicine and Rehabilitation Consult Reason for Consult: Cardiac arrest/shock/encephalopathy Referring Phsyician: Critical care medicine Angel [name] is an 46 y.o. male.   HPI: 46 year old right-handed male with history of polysubstance abuse as well as abuse of prescription medications admitted April 21 after being found unresponsive. 911 was called patient was intubated in the field for agonal breathing and pulseless requiring resuscitation.Marland Kitchen He was brought to the ED and placed on hyperthermia protocol. Urine drug screen positive for cocaine and marijuana.  Cranial CT scan x3 with no acute changes. Echocardiogram with ejection fraction 25% and grade 1 diastolic dysfunction. Blood cultures positive for G.-coccobacilli and placed on broad-spectrum antibiotics. Neurology consulted due to altered mental status with EEG showing general background slowing no seizure activity suspect metabolic encephalopathy. Patient maintained on ventilatory support with tracheostomy completed May 2. Patient is currently n.p.o. planning swallow study may be gastrostomy feeding tube. Venous Doppler studies shows bilateral cephalic vein thrombosis placed on intravenous heparin. Patient has a sitter at bedside and restlessness and decreased awareness of deficits. Physical therapy evaluation completed May 6 with recommendations for physical medicine rehabilitation consult consider inpatient rehabilitation services  Review of Systems  All other systems reviewed and are negative.   Past Medical History  Diagnosis Date  . No significant past medical history    History reviewed. No pertinent past surgical history. History reviewed. No pertinent family history. Social History:  reports that he has been smoking Cigarettes.  He has a 66 pack-year smoking history. He does not have any smokeless tobacco history on file. He reports that he uses illicit drugs (Cocaine and Marijuana). His alcohol history not on  file. Allergies: No Known Allergies Medications Prior to Admission  Medication Sig Dispense Refill  . Aspirin-Salicylamide-Caffeine (BC HEADACHE POWDER PO) Take 1 packet by mouth daily as needed. For pain      . calcium carbonate (OS-CAL) 600 MG TABS Take 600 mg by mouth 2 (two) times daily with a meal.      . fish oil-omega-3 fatty acids 1000 MG capsule Take 2 g by mouth daily.      . Multiple Vitamin (MULITIVITAMIN WITH MINERALS) TABS Take 1 tablet by mouth daily.      Marland Kitchen oxymetazoline (AFRIN NASAL SPRAY) 0.05 % nasal spray Place 2 sprays into the nose daily as needed. For allergies        Home: Home Living Lives With: Alone Available Help at Discharge: Available PRN/intermittently;Family;Friend(s) Type of Home: House Home Access: Level entry Home Layout: One level Home Adaptive Equipment: None  Functional History: Prior Function Able to Take Stairs?: Yes Driving: No Vocation: Full time employment Functional Status:  Mobility: Bed Mobility Bed Mobility: Rolling Right;Right Sidelying to Sit;Sitting - Scoot to Edge of Bed Rolling Right: 4: Min assist Right Sidelying to Sit: 3: Mod assist Sitting - Scoot to Edge of Bed: 3: Mod assist Transfers Transfers: Sit to Stand;Stand to Sit;Stand Pivot Transfers Sit to Stand: 3: Mod assist;From bed Stand to Sit: 4: Min assist;To chair/3-in-1;With armrests Stand Pivot Transfers: 3: Mod assist      ADL:    Cognition: Cognition Arousal/Alertness: Awake/alert Orientation Level: Other (comment) (does not write legibly,does not mouth words well) Cognition Overall Cognitive Status: Appears within functional limits for tasks assessed/performed Arousal/Alertness: Awake/alert Orientation Level: Oriented X4 / Intact Behavior During Session: [facility] for tasks performed  Blood pressure 118/73, pulse 81, temperature 98.8 F (37.1 C), temperature source Oral, resp. rate 25, height 6' (1.829 m), weight 60.7 kg (133 lb 13.1 oz),  SpO2  95.00%. Physical Exam  HENT:  Head: Normocephalic.       NGT in place. #8 trach cuff. Pt mouths and gasps words around trach. Minimal secretions.  Eyes: Conjunctivae and EOM are normal. Pupils are equal, round, and reactive to light.  Neck: Normal range of motion. Neck supple.       Tracheostomy tube in place as well as nasogastric feeding tube.  Cardiovascular: Normal rate and regular rhythm.   Pulmonary/Chest: Effort normal and breath sounds normal.       Decreased breath sounds at the bases  Abdominal: Bowel sounds are normal. He exhibits no distension. There is no tenderness.  Musculoskeletal: He exhibits no edema.  Neurological: He is alert.       Make eye contact with examiner and appropriate with yes and no head nods  for basic questions. Decreased attention. Attempts to mouth some words. Follows one and two-step commands. Proximal greater than distal lower extremity weakness. No focal sensory loss. Diminished FMC.   Skin: Skin is warm and dry.    Results for orders placed during the hospital encounter of 12/24/11 (from the past 24 hour(s))  CBC     Status: Abnormal   Collection Time   01/08/12  5:17 AM      Component Value Range   WBC 13.5 (*) 4.0 - 10.5 (K/uL)   RBC 3.90 (*) 4.22 - 5.81 (MIL/uL)   Hemoglobin 12.4 (*) 13.0 - 17.0 (g/dL)   HCT 81.1 (*) 91.4 - 52.0 (%)   MCV 97.4  78.0 - 100.0 (fL)   MCH 31.8  26.0 - 34.0 (pg)   MCHC 32.6  30.0 - 36.0 (g/dL)   RDW 78.2  95.6 - 21.3 (%)   Platelets 955 (*) 150 - 400 (K/uL)  HEPARIN LEVEL (UNFRACTIONATED)     Status: Abnormal   Collection Time   01/08/12  5:17 AM      Component Value Range   Heparin Unfractionated 0.99 (*) 0.30 - 0.70 (IU/mL)  BASIC METABOLIC PANEL     Status: Abnormal   Collection Time   01/08/12  5:17 AM      Component Value Range   Sodium 144  135 - 145 (mEq/L)   Potassium 3.9  3.5 - 5.1 (mEq/L)   Chloride 108  96 - 112 (mEq/L)   CO2 25  19 - 32 (mEq/L)   Glucose, Bld 112 (*) 70 - 99 (mg/dL)   BUN 43  (*) 6 - 23 (mg/dL)   Creatinine, Ser 0.86  0.50 - 1.35 (mg/dL)   Calcium 9.5  8.4 - 57.8 (mg/dL)   GFR calc non Af Amer >90  >90 (mL/min)   GFR calc Af Amer >90  >90 (mL/min)  MAGNESIUM     Status: Normal   Collection Time   01/08/12  5:17 AM      Component Value Range   Magnesium 2.1  1.5 - 2.5 (mg/dL)  PHOSPHORUS     Status: Normal   Collection Time   01/08/12  5:17 AM      Component Value Range   Phosphorus 3.2  2.3 - 4.6 (mg/dL)  PATHOLOGIST SMEAR REVIEW     Status: Normal   Collection Time   01/08/12  5:17 AM      Component Value Range   Tech Review            No results found.  Assessment/Plan: Diagnosis: cardiac arrest, anoxic encephalopathy, deconditioning 1. Does the need for close, 24 hr/day medical supervision  in concert with the patient's rehab needs make it unreasonable for this patient to be served in a less intensive setting? Potentially 2. Co-Morbidities requiring supervision/potential complications: acute respiratory failure, CM 3. Due to bladder management, bowel management, safety, skin/wound care, disease management, medication administration, pain management and patient education, does the patient require 24 hr/day rehab nursing? potentially 4. Does the patient require coordinated care of a physician, rehab nurse, PT (1-2 hrs/day, 5 days/week), OT (1-2 hrs/day, 5 days/week) and SLP (1-2 hrs/day, 5 days/week) to address physical and functional deficits in the context of the above medical diagnosis(es)? Potentially Addressing deficits in the following areas: balance, endurance, locomotion, strength, transferring, bowel/bladder control, bathing, dressing, feeding, grooming, toileting, cognition, speech, language, swallowing and psychosocial support 5. Can the patient actively participate in an intensive therapy program of at least 3 hrs of therapy per day at least 5 days per week? Potentially 6. The potential for patient to make measurable gains while on inpatient rehab is  good 7. Anticipated functional outcomes upon discharge from inpatient rehab are supervision to min assist with PT, supervision to minimal assist with OT, supervision to minimal assist with SLP. 8. Estimated rehab length of stay to reach the above functional goals is: 2-3 weeks 9. Does the patient have adequate social supports to accommodate these discharge functional goals? No and Potentially 10. Anticipated D/C setting: Home 11. Anticipated post D/C treatments: HH therapy 12. Overall Rehab/Functional Prognosis: good  RECOMMENDATIONS: This patient's condition is appropriate for continued rehabilitative care in the following setting: CIR Patient has agreed to participate in recommended program. Yes Note that insurance prior authorization may be required for reimbursement for recommended care.  Comment: need to confirm social supports who can provide for projected discharge needs. Rehab RN to follow up.   Ivory Broad, MD 01/08/2012

## 2012-01-08 NOTE — Plan of Care (Signed)
Problem: Phase III Progression Outcomes Goal: OOB with assistance Outcome: Completed/Met Date Met:  01/08/12 See Rehab eval/ PT notes

## 2012-01-08 NOTE — Plan of Care (Signed)
Problem: Phase II Progression Outcomes Goal: Other Phase II Outcomes/Goals Outcome: Progressing Variance: Physical/mental limitations

## 2012-01-09 ENCOUNTER — Inpatient Hospital Stay (HOSPITAL_COMMUNITY): Payer: Medicaid Other

## 2012-01-09 LAB — HEPARIN LEVEL (UNFRACTIONATED)
Heparin Unfractionated: 0.72 IU/mL — ABNORMAL HIGH (ref 0.30–0.70)
Heparin Unfractionated: 0.67 IU/mL (ref 0.30–0.70)

## 2012-01-09 LAB — BASIC METABOLIC PANEL
GFR calc Af Amer: >90 mL/min (ref >90)
GFR calc non Af Amer: >90 mL/min (ref >90)
Glucose, Bld: 105 mg/dL — ABNORMAL HIGH (ref 70–99)
Potassium: 3.6 mEq/L (ref 3.5–5.1)
Sodium: 145 mEq/L (ref 135–145)

## 2012-01-09 LAB — CBC
MCH: 31.7 pg (ref 26.0–34.0)
MCHC: 33 g/dL (ref 30.0–36.0)
Platelets: 895 10*3/uL — ABNORMAL HIGH (ref 150–400)
RBC: 3.63 MIL/uL — ABNORMAL LOW (ref 4.22–5.81)

## 2012-01-09 LAB — PHOSPHORUS: Phosphorus: 4.2 mg/dL (ref 2.3–4.6)

## 2012-01-09 MED ORDER — POTASSIUM CHLORIDE 20 MEQ/15ML (10%) PO LIQD
40.0000 meq | Freq: Three times a day (TID) | ORAL | Status: AC
  Administered 2012-01-09 (×2): 40 meq
  Filled 2012-01-09 (×2): qty 30

## 2012-01-09 MED ORDER — HEPARIN (PORCINE) IN NACL 100-0.45 UNIT/ML-% IJ SOLN
1900.0000 [IU]/h | INTRAMUSCULAR | Status: DC
  Administered 2012-01-09: 1900 [IU]/h via INTRAVENOUS
  Filled 2012-01-09 (×4): qty 250

## 2012-01-09 NOTE — Progress Notes (Signed)
Name: ELBRIDGE MAGOWAN DOB: 1965-11-06 MRN: [id number] PCP: No primary provider on file. ADMIT DATE: 12/24/2011 LOS: 16  PCCM PROGRESS NOTE  Brief Patient Profile:46 YO man with no past medical history. His family states that he abuses some perscription drugs and some occasional alcohol. Smokes 2 PPD history. Daughter tried to call him all day but he was sleeping which is unusual for him. His friend found him shaking violently around 8 PM and called 911 who intubated in the field for agonal breathing. He was unresponsive at the time. He was brought to the ED and placed on hypothermia protocol. No ischemic EKG changes on ED arrival. UDS positive for cocaine and THC.    Best Practice: DVT: SQ Heparin >> 4/27 (due to low plat), ScD 4/7 and Argotraban (mod prob HIT) 4/27 > SUP: protonix Nutrition: Tube feed Glycemic control:  Sedation/analgesia: Cont sedation (off precedex 4/26)  Lines / Drains: ETT 4/21>>> 5/2 CVC 4/21>>> Janina Mayo Molli Knock) 5/2>>>  Micro: Blood 4/21>>>HAEMOPHILUS INFLUENZAE Urine 4/21>>>Neg  Sputum 4/21>>>GNR and GPC - NORMAL FLORA ....................... BC 4/27 NTD BAL 4/27 NTD  Urine 4/27 NTD BAL 4/30>>>NTD  Abx Vancomycin 4/21>>>5/1 Primaxin 4/24>>>  No results found for this basename: LATICACIDVEN:5,PCT:5 in the last 168 hours Recent Results (from the past 240 hour(s))  CULTURE, BLOOD (ROUTINE X 2)     Status: Normal   Collection Time   12/30/11 11:08 AM      Component Value Range Status Comment   Specimen Description BLOOD ARM RIGHT   Final    Special Requests BOTTLES DRAWN AEROBIC AND ANAEROBIC [facility]   Final    Culture  Setup Time [id number]   Final    Culture NO GROWTH 5 DAYS   Final    Report Status 01/05/2012 FINAL   Final   CULTURE, BLOOD (ROUTINE X 2)     Status: Normal   Collection Time   12/30/11 11:20 AM      Component Value Range Status Comment   Specimen Description BLOOD ARM RIGHT   Final    Special Requests BOTTLES DRAWN AEROBIC ONLY  10CC    Final    Culture  Setup Time [id number]   Final    Culture NO GROWTH 5 DAYS   Final    Report Status 01/05/2012 FINAL   Final   URINE CULTURE     Status: Normal   Collection Time   12/30/11 11:22 AM      Component Value Range Status Comment   Specimen Description URINE, CATHETERIZED   Final    Special Requests NONE   Final    Culture  Setup Time [id number]   Final    Colony Count NO GROWTH   Final    Culture NO GROWTH   Final    Report Status 12/31/2011 FINAL   Final   CULTURE, BAL-QUANTITATIVE     Status: Normal   Collection Time   12/30/11 12:51 PM      Component Value Range Status Comment   Specimen Description BRONCHIAL ALVEOLAR LAVAGE   Final    Special Requests NONE   Final    Gram Stain     Final    Value: MODERATE WBC PRESENT, PREDOMINANTLY PMN     NO SQUAMOUS EPITHELIAL CELLS SEEN     NO ORGANISMS SEEN   Colony Count 2,000 COLONIES/ML   Final    Culture Non-Pathogenic Oropharyngeal-type Flora Isolated.   Final    Report Status 01/02/2012 FINAL   Final   AFB  CULTURE WITH SMEAR     Status: Normal (Preliminary result)   Collection Time   01/02/12  3:05 PM      Component Value Range Status Comment   Specimen Description BRONCHIAL ALVEOLAR LAVAGE   Final    Special Requests ADDED 01/02/12 1552   Final    ACID FAST SMEAR NO ACID FAST BACILLI SEEN   Final    Culture     Final    Value: CULTURE WILL BE EXAMINED FOR 6 WEEKS BEFORE ISSUING A FINAL REPORT   Report Status PENDING   Incomplete   FUNGUS CULTURE W SMEAR     Status: Normal (Preliminary result)   Collection Time   01/02/12  3:05 PM      Component Value Range Status Comment   Specimen Description BRONCHIAL ALVEOLAR LAVAGE   Final    Special Requests ADDED 01/02/12 1552   Final    Fungal Smear NO YEAST OR FUNGAL ELEMENTS SEEN   Final    Culture CULTURE IN PROGRESS FOR FOUR WEEKS   Final    Report Status PENDING   Incomplete   CULTURE, BAL-QUANTITATIVE     Status: Normal   Collection Time   01/02/12  3:05  PM      Component Value Range Status Comment   Specimen Description BRONCHIAL ALVEOLAR LAVAGE LUNG RIGHT LOWER   Final    Special Requests NONE   Final    Gram Stain     Final    Value: RARE WBC PRESENT,BOTH PMN AND MONONUCLEAR     NO SQUAMOUS EPITHELIAL CELLS SEEN     NO ORGANISMS SEEN   Colony Count NO GROWTH   Final    Culture NO GROWTH 2 DAYS   Final    Report Status 01/04/2012 FINAL   Final     Antibiotics: Anti-infectives     Start     Dose/Rate Route Frequency Ordered Stop   01/01/12 1200   imipenem-cilastatin (PRIMAXIN) 500 mg in sodium chloride 0.9 % 100 mL IVPB        500 mg 200 mL/hr over 30 Minutes Intravenous 4 times per day 01/01/12 0943     12/31/11 1400   imipenem-cilastatin (PRIMAXIN) 500 mg in sodium chloride 0.9 % 100 mL IVPB  Status:  Discontinued        500 mg 200 mL/hr over 30 Minutes Intravenous 3 times per day 12/31/11 1121 01/01/12 0943   12/31/11 1300   vancomycin (VANCOCIN) 1,250 mg in sodium chloride 0.9 % 250 mL IVPB  Status:  Discontinued        1,250 mg 166.7 mL/hr over 90 Minutes Intravenous Every 12 hours 12/31/11 1120 01/03/12 1105   12/25/11 0600   piperacillin-tazobactam (ZOSYN) IVPB 3.375 g  Status:  Discontinued        3.375 g 12.5 mL/hr over 240 Minutes Intravenous 3 times per day 12/24/11 2257 12/31/11 1028   12/24/11 2359   vancomycin (VANCOCIN) IVPB 1000 mg/200 mL premix  Status:  Discontinued        1,000 mg 200 mL/hr over 60 Minutes Intravenous Daily at bedtime 12/24/11 2301 12/29/11 1009   12/24/11 2045  piperacillin-tazobactam (ZOSYN) 3.375 g in dextrose 5 % 50 mL IVPB       3.375 g 100 mL/hr over 30 Minutes Intravenous [facility] 12/24/11 2011 12/24/11 2206   12/24/11 2015   vancomycin (VANCOCIN) IVPB 1000 mg/200 mL premix  Status:  Discontinued        1,000 mg  200 mL/hr over 60 Minutes Intravenous  Once 12/24/11 2011 12/24/11 2301         BMET    Component Value Date/Time   NA 145 01/09/2012 0418   K 3.6  01/09/2012 0418   CL 109 01/09/2012 0418   CO2 27 01/09/2012 0418   GLUCOSE 105* 01/09/2012 0418   BUN 41* 01/09/2012 0418   CREATININE 0.65 01/09/2012 0418   CALCIUM 9.3 01/09/2012 0418   GFRNONAA >90 01/09/2012 0418   GFRAA >90 01/09/2012 0418   CBC    Component Value Date/Time   WBC 12.6* 01/09/2012 0418   RBC 3.63* 01/09/2012 0418   HGB 11.5* 01/09/2012 0418   HCT 34.9* 01/09/2012 0418   PLT 895* 01/09/2012 0418   MCV 96.1 01/09/2012 0418   MCH 31.7 01/09/2012 0418   MCHC 33.0 01/09/2012 0418   RDW 13.5 01/09/2012 0418   LYMPHSABS 0.8 12/24/2011 2009   MONOABS 0.1 12/24/2011 2009   EOSABS 0.0 12/24/2011 2009   BASOSABS 0.0 12/24/2011 2009   Tests / Events:  4/27 - HIT panel negative, off angiomax back to heparin. 4/28: agitated on wUA but followed commands.   Consults: Neuro (signed off 5/1).  Subjective/Overnight/Interval History: No events overnight.  Vital Signs: Temp:  [98.5 F (36.9 C)-99.1 F (37.3 C)] 98.5 F (36.9 C) (05/07 0745) Pulse Rate:  [74-92] 90  (05/07 0745) Resp:  [24-37] 28  (05/07 0745) BP: (108-137)/(59-76) 114/76 mmHg (05/07 0745) SpO2:  [94 %-99 %] 98 % (05/07 0745) FiO2 (%):  [28 %-40 %] 28 % (05/07 0745) Weight:  [61 kg (134 lb 7.7 oz)] 61 kg (134 lb 7.7 oz) (05/07 0332) I/O last 3 completed shifts: In: 3397.9 [I.V.:1102.9; Other:30; ZO/XW:9604; IV Piggyback:400] Out: 3120 [Urine:2720; Stool:400]  Intake/Output Summary (Last 24 hours) at 01/09/12 1054 Last data filed at 01/09/12 0900  Gross per 24 hour  Intake 1998.35 ml  Output   1435 ml  Net 563.35 ml   Physical Examination: General: Critically ill looking male. On PS.  Neuro: Extremely agitated on WUA. Moving all 4s. Cough +. Corneals +, REports that he followed commands +   HEENT:  corneals +. No jaundice Neck: Trach and clearn Cardiovascular:  Normal heart sounds.+ Chest: Cleart to ausculatation Lungs:  Normal chest Abdomen:  Soft, non tennder Musculoskeletal:   Normal joints Skin:   intact Extremities:No edema, no cyanosis, no clubbing.  Ventilator settings: Vent Mode:  [-]  FiO2 (%):  [28 %-40 %] 28 %  Labs/Radiology: Please see A and P for full labs  No results found.  ASSESSMENT AND PLAN 46 year old male with PMH of cocaine, etoh and drug abuse presenting after being found down in a hotel room positive for cocaine and THC. Patient was noted to have vomitus into his airway upon intubation. RUL and RML infiltrate noted. Patient was started on Abx and hypothermia as well ARDS protocol were started.  Neuro: following commands and now off sedation but has a significant history of drug abuse and alcoholism.  EEG shows slowness but likely multifactorial with metabolic, septic and anoxic encephalopathy. Plan: PRN Haldol.  Cardiac: s/p cardiac arrest.  Shock resolved.  Off pressors. Plan: Hold diureses for today.  Monitor BP.  Cath once critical illness has resolved per cardiology.  Beta blockers for sinus tachycardia.  Pulmonary: On TC overnight for the first time last night,will maintain on TC as tolerated. Plan: Maintain on TC as tolerated.  BAL cultures from 4/30, NTD.  Titrate O2 down.  PMV with partial success will defer to speech.  Swallow evaluation once able to downsize trach to a cuffless 6, will order that today.  GI: protein calorie malnutrition.  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 Plan: Continue TF.  Will check swallow evaluation prior to decision on diet.  Renal:  No active issues, hold diureses today.  BMET in AM.  ID: PNA, likely aspiration.  H flu in BAL. Plan: D/C vancomycin (completed 10 days)  Continue primaxin for 14/14 days, will D/C in AM.  Heme: DVT in bilateral upper ext and low platelet.  Neg HITT panel. Plan: Continue heparin.  Hold coumadin for now until PEG situation is assessed, if PEG is needed then will start coumadin after PEG placement.  Endo: ISS  Downsize trach to cuffless 6 and once done will  check swallow evaluation.  Dian Laprade 01/09/2012, 10:54 AM

## 2012-01-09 NOTE — Progress Notes (Signed)
CSW reviewed chart and discussed pt with RNCM.     -Per Britta Mccreedy, [facility] is unable to accept this pt as he does not have 24/7 care available at home  -Per Morrie Sheldon in financial counseling, pt mother has completed paperwork for both disability and Medicaid.  -CSW following to address disposition as appropriate to pt progression. Note pt doing well on TC and hopeful to reach 28% and pt will need PEG prior to SNF placement.   Baxter Flattery, MSW [phone]

## 2012-01-09 NOTE — Progress Notes (Signed)
Subjective:  He seems to be doing better.  Upright in chair and responding  Objective:  Vital Signs in the last 24 hours: Temp:  [98.5 F (36.9 C)-99.1 F (37.3 C)] 98.5 F (36.9 C) (05/07 0745) Pulse Rate:  [74-101] 90  (05/07 0745) Resp:  [24-39] 28  (05/07 0745) BP: (108-137)/(59-76) 114/76 mmHg (05/07 0745) SpO2:  [94 %-99 %] 98 % (05/07 0745) FiO2 (%):  [28 %-40 %] 28 % (05/07 0745) Weight:  [134 lb 7.7 oz (61 kg)] 134 lb 7.7 oz (61 kg) (05/07 0332)  Intake/Output from previous day: 05/06 0701 - 05/07 0700 In: 2342.4 [I.V.:667.4; NG/GT:1445; IV Piggyback:200] Out: 1745 [Urine:1745]   Physical Exam: General: Thin, chronically ill appearing gentleman Head:  Normocephalic and atraumatic. Lungs: coarse ronchii Heart: Normal S1 and S2.  No murmur, rubs or gallops.  Pulses: Pulses normal in all 4 extremities. Extremities: No clubbing or cyanosis. No edema. Neurologic: ? How alert but does respond    Lab Results:  Basename 01/09/12 0418 01/08/12 0517  WBC 12.6* 13.5*  HGB 11.5* 12.4*  PLT 895* 955*    Basename 01/09/12 0418 01/08/12 0517  NA 145 144  K 3.6 3.9  CL 109 108  CO2 27 25  GLUCOSE 105* 112*  BUN 41* 43*  CREATININE 0.65 0.75   No results found for this basename: TROPONINI:2,CK,MB:2 in the last 72 hours Hepatic Function Panel No results found for this basename: PROT,ALBUMIN,AST,ALT,ALKPHOS,BILITOT,BILIDIR,IBILI in the last 72 hours No results found for this basename: CHOL in the last 72 hours No results found for this basename: PROTIME in the last 72 hours  Imaging: No results found.  ECHO:  See report.    Assessment/Plan:  Patient Active Hospital Problem List: Acute respiratory failure (12/25/2011)   Assessment: no off vent   Plan: per CCM Cardiac arrest (12/25/2011)   Assessment: PEA arrest in setting of presumed drug overdose.  EF now normal by echo   Plan: will review with our team  Still have some concerns about plt count.  It has been  steadily rising.  Will defer decisions to CCM       Shawnie Pons, MD, [facility], [facility] 01/09/2012, 8:18 AM

## 2012-01-09 NOTE — Progress Notes (Signed)
Physical Therapy Treatment Patient Details Name: Angel [name] MRN: [id number] DOB: [birth year] Today's Date: 01/09/2012 Time: 4098-1191 PT Time Calculation (min): 17 min  PT Assessment / Plan / Recommendation Comments on Treatment Session  Patient s/p respiratory failure with decr mobility secondary to prolonged hospitalization and trach placed.  Will benefit from PT to address balance and endurance issues.  Agree with Rehab if patient can go over next few days.      Follow Up Recommendations  Inpatient Rehab;Supervision - Intermittent    Equipment Recommendations  None recommended by PT    Frequency Min 3X/week   Plan Discharge plan remains appropriate;Frequency remains appropriate    Precautions / Restrictions Precautions Precautions: Fall Precaution Comments: Trach collar Restrictions Weight Bearing Restrictions: No   Pertinent Vitals/Pain VSS/ Some neck pain    Mobility  Bed Mobility Bed Mobility: Not assessed Transfers Transfers: Sit to Stand;Stand to Sit Sit to Stand: 4: Min assist;With upper extremity assist;With armrests;From chair/3-in-1 Stand to Sit: 4: Min assist;With upper extremity assist;With armrests;To chair/3-in-1 Details for Transfer Assistance: cues for hand placement and for safety.  Some incoordination noted and unsteadiness on feet.   Ambulation/Gait Ambulation/Gait Assistance: 1: +2 Total assist Ambulation/Gait: Patient Percentage: 80 (second person for lines) Ambulation Distance (Feet): 165 Feet Assistive device: Rolling walker Ambulation/Gait Assistance Details: Patient with O2 trach collar at 28%.  Patient ambulated with RW with cues for safety and assist to steer RW.  Patient needed cues for widening BOS and for keeping feet inside RW.  Posture flexed somewhat which also contributed to decr safety with RW.  Veers Left with RW.   Gait Pattern: Step-to pattern;Decreased stride length;Shuffle;Ataxic;Trunk flexed;Narrow base of support Gait velocity:  Uncoordinated cadence with patient varying how fast and slow he walked.   Stairs: No Corporate treasurer: No         PT Goals Acute Rehab PT Goals PT Goal: Sit at [place] Of Bed - Progress: Progressing toward goal PT Goal: Sit to Stand - Progress: Progressing toward goal PT Goal: Stand to Sit - Progress: Progressing toward goal PT Transfer Goal: Bed to Chair/Chair to Bed - Progress: Progressing toward goal PT Goal: Stand - Progress: Progressing toward goal PT Goal: Ambulate - Progress: Progressing toward goal  Visit Information  Last PT Received On: 01/09/12 Assistance Needed: +2 PT/OT Co-Evaluation/Treatment: Yes    Subjective Data  Subjective: Patient agreed to walk   Cognition  Overall Cognitive Status: Appears within functional limits for tasks assessed/performed Arousal/Alertness: Awake/alert Orientation Level: Oriented X4 / Intact Behavior During Session: [facility] for tasks performed    Balance  Balance Balance Assessed: Yes Static Sitting Balance Static Sitting - Balance Support: Feet supported;Bilateral upper extremity supported Static Sitting - Level of Assistance: 5: Stand by assistance Static Sitting - Comment/# of Minutes: 1  End of Session PT - End of Session Equipment Utilized During Treatment: Gait belt Activity Tolerance: Patient limited by fatigue Patient left: in chair;with call bell/phone within reach;with family/visitor present Psychiatrist present) Nurse Communication: Mobility status    INGOLD,Nohea Kras 01/09/2012, 4:28 PM  Jazmarie Biever [place],PT Acute Rehabilitation [phone] (pager)

## 2012-01-09 NOTE — Progress Notes (Signed)
Pt unlikely to show progress with PMSV for communication or swallow eval until trach downsized to 6 cuffless. Order put in today, will defer treatment until tomorrow. Harlon Ditty, MA CCC-SLP [phone]

## 2012-01-09 NOTE — Progress Notes (Signed)
Mom, Angel [name], returned my phone call. Patient has been separated for greater than 10 years. He has 3 children, Alyssa is 47 years of age. Mom is in the process of adopting two children which she has fostered for 5 years. She is not allowed to have additional family members in the home. Therefore, patient does not have 24/7 assistance for an eventual discharge home after a short rehab.  SNF rehab is recommended at this time. I will contact RN CM and SW.

## 2012-01-09 NOTE — Progress Notes (Signed)
ANTICOAGULATION CONSULT NOTE - Follow Up Consult  Pharmacy Consult for Heparin Indication: Bilateral cephalic vein thrombosis  No Known Allergies  Patient Measurements: Height: 6' (182.9 cm) Weight: 134 lb 7.7 oz (61 kg) IBW/kg (Calculated) : 77.6  Heparin Dosing Weight: 62 kg  Vital Signs: Temp: 98.8 F (37.1 C) (05/07 1250) Temp src: Oral (05/07 1250) BP: 112/71 mmHg (05/07 1250) Pulse Rate: 86  (05/07 1250) Labs:  Basename 01/09/12 1608 01/09/12 0418 01/08/12 1914 01/08/12 0517 01/07/12 0545  HGB -- 11.5* -- 12.4* --  HCT -- 34.9* -- 38.0* 37.1*  PLT -- 895* -- 955* 889*  APTT -- -- -- -- --  LABPROT -- -- -- -- --  INR -- -- -- -- --  HEPARINUNFRC 0.67 0.72* 0.73* -- --  CREATININE -- 0.65 -- 0.75 0.91  CKTOTAL -- -- -- -- --  CKMB -- -- -- -- --  TROPONINI -- -- -- -- --   Estimated Creatinine Clearance: 98.5 ml/min (by C-G formula based on Cr of 0.65).  Assessment: 46 yo male with significant history of IV drug abuse & bilateral cephalic vein thrombosis.   Heparin level at goal (0.67) after heparin decreased to 1900 units/hr.   Goal of Therapy:  Heparin level 0.3-0.7 units/ml Platelet monitoring per protocol: yes   Plan -Continue heparin at current rate and recheck a heparin level in am -CBC daily  Harland German, Pharm D 01/09/2012 5:19 PM

## 2012-01-09 NOTE — Evaluation (Signed)
Occupational Therapy Evaluation Patient Details Name: Angel [name] MRN: [id number] DOB: Nov 18, 1965 Today's Date: 01/09/2012 Time: 4098-1191 OT Time Calculation (min): 17 min  OT Assessment / Plan / Recommendation Clinical Impression  Patient was found at home by friend who found him shaking violently and unresponsive they called 911 who intubated in the field for agonal breathing. UDS positive for cocaine and THC. Patients hospital course also significant for cardiac arrest, PNA, and DVT bil UE's. Now on trach collar. Pt presents with below problem list and will benefit from skilled OT in the acute setting to maximize I with ADL and ADL mobility prior to d/c home.     OT Assessment  Patient needs continued OT Services    Follow Up Recommendations  Skilled nursing facility    Equipment Recommendations  None recommended by PT    Frequency Min 1X/week    Precautions / Restrictions Precautions Precautions: Fall Precaution Comments: Trach collar Restrictions Weight Bearing Restrictions: No   Pertinent Vitals/Pain Pt with 2/10 pain in neck. repositioned    ADL  Eating/Feeding: Simulated;Minimal assistance;NPO Where Assessed - Eating/Feeding: Chair Grooming: Simulated;Minimal assistance;Wash/dry face Where Assessed - Grooming: Supported sitting Upper Body Dressing: Simulated;Moderate assistance (gown (pt with many lines)) Where Assessed - Upper Body Dressing: Standing Lower Body Dressing: Simulated;Moderate assistance Where Assessed - Lower Body Dressing: Sit to stand from chair Toilet Transfer: Simulated;+2 Total assistance (pt=80%) Toilet Transfer Method: Ambulating Toileting - Hygiene: Performed;+1 Total assistance (secondary pt with flexiseal) Where Assessed - Toileting Hygiene: Standing Ambulation Related to ADLs: see toileting ADL Comments: pt impulsive    OT Goals Acute Rehab OT Goals OT Goal Formulation: With patient Time For Goal Achievement: 01/23/12 Potential to  Achieve Goals: Good ADL Goals Pt Will Perform Grooming: Standing at sink;with modified independence ADL Goal: Grooming - Progress: Goal set today Pt Will Perform Upper Body Dressing: with set-up;Sitting, bed;Sitting, chair ADL Goal: Upper Body Dressing - Progress: Goal set today Pt Will Perform Lower Body Dressing: with set-up;with supervision;Sit to stand from chair;Sit to stand from bed ADL Goal: Lower Body Dressing - Progress: Goal set today Pt Will Transfer to Toilet: with supervision;Ambulation;with DME;3-in-1 ADL Goal: Toilet Transfer - Progress: Goal set today Pt Will Perform Toileting - Clothing Manipulation: Independently;Standing ADL Goal: Toileting - Clothing Manipulation - Progress: Goal set today  Visit Information  Last OT Received On: 01/09/12 Assistance Needed: +2 PT/OT Co-Evaluation/Treatment: Yes    Subjective Data  Subjective: I was going after the candy (as pt lunged forward during walking) Patient Stated Goal: Return to work at [employer]   Prior Functioning  Home Living Lives With: Alone Available Help at Discharge: Available PRN/intermittently;Family;Friend(s) Type of Home: House Home Access: Level entry Home Layout: One level Bathroom Shower/Tub: Engineer, manufacturing systems: Standard Home Adaptive Equipment: None Prior Function Level of Independence: Independent Able to Take Stairs?: Yes Driving: No Vocation: Full time employment Communication Communication: Tracheostomy Dominant Hand: Right    Cognition  Overall Cognitive Status: Appears within functional limits for tasks assessed/performed Arousal/Alertness: Awake/alert Orientation Level: Oriented X4 / Intact Behavior During Session:  (Impulsive) Cognition - Other Comments: during ambulation, lurched forward to basket of candy, then stated he was "just kidding"    Extremity/Trunk Assessment Right Upper Extremity Assessment RUE ROM/Strength/Tone: Deficits RUE ROM/Strength/Tone Deficits:  grossly 4+/5 RUE Sensation: WFL - Light Touch RUE Coordination: Deficits RUE Coordination Deficits: limited by weakness Left Upper Extremity Assessment LUE ROM/Strength/Tone: WFL for tasks assessed LUE Sensation: WFL - Light Touch LUE Coordination: Deficits LUE  Coordination Deficits: limited by weakness   Mobility Bed Mobility Bed Mobility: Not assessed Transfers Sit to Stand: 4: Min assist;With upper extremity assist;With armrests;From chair/3-in-1 Stand to Sit: 4: Min assist;With upper extremity assist;With armrests;To chair/3-in-1 Details for Transfer Assistance: cues for hand placement and for safety.  Some incoordination noted and unsteadiness on feet.     Exercise    Balance Balance Balance Assessed: Yes Static Sitting Balance Static Sitting - Balance Support: Feet supported;Bilateral upper extremity supported Static Sitting - Level of Assistance: 5: Stand by assistance Static Sitting - Comment/# of Minutes: 1  End of Session OT - End of Session Equipment Utilized During Treatment: Gait belt Activity Tolerance: Patient tolerated treatment well Patient left: in chair;with call bell/phone within reach (sitting in room) Nurse Communication: Mobility status   Alie Hardgrove 01/09/2012, 4:38 PM

## 2012-01-09 NOTE — Procedures (Signed)
First Trach Change Prior To Establishing a Tract  Speech pathology recommend change of size 8 cuffed trach to size 6 cuffless inorder to facilitate PMV and swallow.  Trach changed over a catheter with minimal difficulty due to stoma size but changed successfully.  Good CO2 return.  CXR ordered and pending.   Alyson Reedy, M.D. [facility] Pulmonary/Critical Care Medicine. Pager: [phone]. After hours pager: [phone].

## 2012-01-09 NOTE — Progress Notes (Signed)
ANTICOAGULATION CONSULT NOTE - Follow Up Consult  Pharmacy Consult for Heparin Indication: Bilateral cephalic vein thrombosis  No Known Allergies  Patient Measurements: Height: 6' (182.9 cm) Weight: 134 lb 7.7 oz (61 kg) IBW/kg (Calculated) : 77.6  Heparin Dosing Weight: 62 kg  Vital Signs: Temp: 98.5 F (36.9 C) (05/07 0745) Temp src: Oral (05/07 0745) BP: 114/76 mmHg (05/07 0745) Pulse Rate: 90  (05/07 0745) Labs:  Alvira Philips 01/09/12 0418 01/08/12 1914 01/08/12 1208 01/08/12 0517 01/07/12 0545  HGB 11.5* -- -- 12.4* --  HCT 34.9* -- -- 38.0* 37.1*  PLT 895* -- -- 955* 889*  APTT -- -- -- -- --  LABPROT -- -- -- -- --  INR -- -- -- -- --  HEPARINUNFRC 0.72* 0.73* 0.92* -- --  CREATININE 0.65 -- -- 0.75 0.91  CKTOTAL -- -- -- -- --  CKMB -- -- -- -- --  TROPONINI -- -- -- -- --   Estimated Creatinine Clearance: 98.5 ml/min (by C-G formula based on Cr of 0.65).  Assessment: 46 yo male with significant history of IV drug abuse & bilateral cephalic vein thrombosis.   Heparin level 0.72 this AM. No bleeding reported. H/H ok, Plts 895.   Goal of Therapy:  Heparin level 0.3-0.7 units/ml   Plan:  1. Decrease IV heparin to 1900 units/hr 2. F/u 6hr heparin level

## 2012-01-09 NOTE — Progress Notes (Signed)
I have left three messages for pt's daughter, Angel [name] to contact me to discuss rehab options. I have left a message now for his mother, Angel [name]. Await return calls to assist in determining rehab venue options depending on caregivers available at discharge.  [phone]

## 2012-01-10 DIAGNOSIS — I469 Cardiac arrest, cause unspecified: Secondary | ICD-10-CM

## 2012-01-10 LAB — BASIC METABOLIC PANEL
Calcium: 9.6 mg/dL (ref 8.4–10.5)
GFR calc non Af Amer: >90 mL/min (ref >90)
Sodium: 145 mEq/L (ref 135–145)

## 2012-01-10 LAB — CBC
MCHC: 32.6 g/dL (ref 30.0–36.0)
RDW: 13.6 % (ref 11.5–15.5)

## 2012-01-10 LAB — HEPARIN LEVEL (UNFRACTIONATED): Heparin Unfractionated: 0.72 IU/mL — ABNORMAL HIGH (ref 0.30–0.70)

## 2012-01-10 MED ORDER — ENOXAPARIN SODIUM 60 MG/0.6ML ~~LOC~~ SOLN
1.0000 mg/kg | Freq: Two times a day (BID) | SUBCUTANEOUS | Status: DC
  Administered 2012-01-11 – 2012-01-17 (×13): 60 mg via SUBCUTANEOUS
  Filled 2012-01-10 (×16): qty 0.6

## 2012-01-10 MED ORDER — ENOXAPARIN SODIUM 60 MG/0.6ML ~~LOC~~ SOLN
1.0000 mg/kg | SUBCUTANEOUS | Status: AC
  Administered 2012-01-10: 60 mg via SUBCUTANEOUS
  Filled 2012-01-10: qty 0.6

## 2012-01-10 MED ORDER — METOPROLOL TARTRATE 25 MG/10 ML ORAL SUSPENSION
25.0000 mg | Freq: Three times a day (TID) | ORAL | Status: DC
  Administered 2012-01-10 – 2012-01-19 (×28): 25 mg
  Filled 2012-01-10 (×33): qty 10

## 2012-01-10 MED ORDER — CHLORHEXIDINE GLUCONATE 0.12 % MT SOLN
15.0000 mL | Freq: Two times a day (BID) | OROMUCOSAL | Status: DC
  Administered 2012-01-10 – 2012-01-18 (×17): 15 mL via OROMUCOSAL
  Filled 2012-01-10 (×21): qty 15

## 2012-01-10 MED ORDER — HEPARIN (PORCINE) IN NACL 100-0.45 UNIT/ML-% IJ SOLN
1750.0000 [IU]/h | INTRAMUSCULAR | Status: DC
  Administered 2012-01-10: 1750 [IU]/h via INTRAVENOUS
  Filled 2012-01-10: qty 250

## 2012-01-10 MED ORDER — BIOTENE DRY MOUTH MT LIQD
15.0000 mL | Freq: Two times a day (BID) | OROMUCOSAL | Status: DC
  Administered 2012-01-10 – 2012-01-18 (×16): 15 mL via OROMUCOSAL

## 2012-01-10 MED ORDER — ENOXAPARIN SODIUM 60 MG/0.6ML ~~LOC~~ SOLN
1.0000 mg/kg | Freq: Once | SUBCUTANEOUS | Status: AC
  Administered 2012-01-10: 60 mg via SUBCUTANEOUS
  Filled 2012-01-10: qty 0.6

## 2012-01-10 MED ORDER — FREE WATER
200.0000 mL | Freq: Three times a day (TID) | Status: DC
  Administered 2012-01-10 – 2012-01-16 (×20): 200 mL

## 2012-01-10 NOTE — Progress Notes (Signed)
Passy-Muir Speaking Valve - Treatment Patient Details  Name: Angel [name] MRN: [id number] Date of Birth: [birth year]  Today's Date: 01/10/2012 Time: 0750-0830 SLP Time Calculation (min): 40 min  Past Medical History:  Past Medical History  Diagnosis Date  . No significant past medical history    Past Surgical History: History reviewed. No pertinent past surgical history.  Assessment / Plan / Recommendation Clinical Impression  Pt's trach downsized to a six 6 cuffless, now tolerating placement of valve for 40 minutes with adequate redirection of air to the upper airway. Vital signs remained stable throughout session and no CO2 trapping obsevred. Pt is safe to wear valve with full supervision during all waking hours.       Plan  Continue with current plan of care    Follow Up Recommendations  Skilled Nursing facility    Pertinent Vitals/Pain NA     SLP Goals SLP Goal #1: Pt. will increase coordination of respiration and phonation for adequate verbalization in sentences using PMSV with mild verbal/visual cues SLP Goal #1 - Progress: Met SLP Goal #2 - Progress: Met SLP Goal #3: Pt will increase volume of speech with minimal verbal cues at conversation level.  SLP Goal #4: Pt will increase strength of cough for oral expectoration of secretions with moderate verbal cues.    PMSV Trial  PMSV was placed for: 40 minutes Able to redirect subglottic air through upper airway: Yes Able to Attain Phonation: Yes Voice Quality: Wet Able to Expectorate Secretions: No Breath Support for Phonation: Mildly decreased Intelligibility: Intelligible Respirations During Trial: 30  SpO2 During Trial: 95 % Pulse During Trial: 85  Behavior: Alert   Tracheostomy Tube  Additional Tracheostomy Tube Assessment Trach Collar Period: 24 hours Secretion Description: thick, white Frequency of Tracheal Suctioning: several times a day Level of Secretion Expectoration: Tracheal    Vent Dependency  FiO2 (%): 12 %    Cuff Deflation Trial     Lee-Ann Gal, Riley Nearing 01/10/2012, 9:17 AM

## 2012-01-10 NOTE — Evaluation (Signed)
Speech Language Pathology Evaluation Patient Details Name: Angel [name] MRN: [id number] DOB: Jul 14, 1966 Today's Date: 01/10/2012 Time: 7829-5621 SLP Time Calculation (min): 40 min  Problem List:  Patient Active Problem List  Diagnoses  . No significant past medical history  . Acute respiratory failure  . Pneumonia, organism unspecified  . Acidosis  . Altered mental status  . Cardiac arrest  . Anoxic encephalopathy  . Thrombocytopenia  . Cardiomyopathy secondary   Past Medical History:  Past Medical History  Diagnosis Date  . No significant past medical history    Past Surgical History: History reviewed. No pertinent past surgical history.  Assessment / Plan / Recommendation Clinical Impression  Pt presents with moderate cognitive deficits including decreased selective attention, memory, awareness of deficits, poor problem solving with tasks of moderate complexity requiring supervision and moderate verbal cueing with functional tasks. Pt will require acute therapy to address these issues for safety and independence with ADLs.     SLP Assessment  Patient needs continued Speech Lanaguage Pathology Services    Follow Up Recommendations  Skilled Nursing facility    Frequency and Duration min 2x/week  2 weeks   Pertinent Vitals/Pain NA   SLP Goals  SLP Goals Potential to Achieve Goals: Good Potential Considerations: Financial resources Progress/Goals/Alternative treatment plan discussed with pt/caregiver and they: Agree SLP Goal #1: Pt will demonstrate emergent awareness of deficits during functional tasks with min verbal cues SLP Goal #1 - Progress: Met SLP Goal #2: Pt will complete basic functional problem solving tasks with minimal contextual and verbal cues.  SLP Goal #2 - Progress: Met SLP Goal #3: Pt will increase volume of speech with minimal verbal cues at conversation level.  SLP Goal #4: Pt will increase strength of cough for oral expectoration of secretions  with moderate verbal cues.   SLP Evaluation Prior Functioning  Cognitive/Linguistic Baseline: Information not available Type of Home: House Lives With: Other (Comment) (girlfriend) Vocation: Full time employment Software engineer)   Cognition  Overall Cognitive Status: Impaired Arousal/Alertness: Awake/alert Orientation Level: Oriented to person;Oriented to place;Oriented to time;Disoriented to situation Attention: Sustained;Selective Sustained Attention: Appears intact Selective Attention: Impaired Selective Attention Impairment: Functional complex;Verbal complex Memory: Impaired Memory Impairment: Storage deficit;Retrieval deficit Awareness: Impaired Awareness Impairment: Intellectual impairment;Emergent impairment;Anticipatory impairment Problem Solving: Impaired Problem Solving Impairment: Verbal complex;Functional basic Behaviors: Impulsive;Poor frustration tolerance Safety/Judgment: Impaired    Comprehension  Auditory Comprehension Overall Auditory Comprehension: Appears within functional limits for tasks assessed    Expression Verbal Expression Overall Verbal Expression: Appears within functional limits for tasks assessed Pragmatics: Impairment Impairments: Eye contact   Oral / Motor Oral Motor/Sensory Function Overall Oral Motor/Sensory Function: Impaired (poor effort with oral motor tasks. no dysarthria) Motor Speech Overall Motor Speech: Appears within functional limits for tasks assessed Intelligibility: Intelligible   Angel Ditty, MA CCC-SLP [phone]   Claudine Mouton 01/10/2012, 9:41 AM

## 2012-01-10 NOTE — Evaluation (Signed)
Clinical/Bedside Swallow Evaluation Patient Details  Name: Angel [name] MRN: [id number] Date of Birth: [birth year]  Today's Date: 01/10/2012 Time: 0800-0815 SLP Time Calculation (min): 15 min  Past Medical History:  Past Medical History  Diagnosis Date  . No significant past medical history    Past Surgical History: History reviewed. No pertinent past surgical history. HPI:  46 yr old admitted with cardiac arrest, + cocaine, likely drug overdose.  Intubated 4/21-5/2.  Trach 5/2 (no PEG as previously documented, pt with PANDA will need PEG if dysphagia is severe). Pt tolerating PMSV with sull supervision after downsize to 6 cuffless shiley.    Assessment / Plan / Recommendation Clinical Impression  Pt presents with overt signs of aspiration with minimal presentation of thin liquids. Suspect weak pharyngeal strength and motility with decreased airway protection and possible residuals post swallow. Will proceed with objective swallow evaluation to dtermine if pt can tolerate a modified diet safely. Will complete FEES at bedside this afternoon.     Aspiration Risk  Moderate    Diet Recommendation NPO;Alternative means - temporary   Other  Recommendations FEES   Follow Up Recommendations  Skilled Nursing facility    Frequency and Duration        Pertinent Vitals/Pain NA    SLP Swallow Goals     Swallow Study Prior Functional Status       General HPI: 46 yr old admitted with cardiac arrest, + cocaine, likely drug overdose.  Intubated 4/21-5/2.  Trach 5/2 (no PEG as previously documented, pt with PANDA will need PEG if dysphagia is severe). Pt tolerating PMSV with sull supervision after downsize to 6 cuffless shiley.  Type of Study: Bedside swallow evaluation Diet Prior to this Study: NPO;Panda Temperature Spikes Noted: No Respiratory Status: Trach Trach Size and Type: #6;Uncuffed;With PMSV in place History of Intubation: Yes Behavior/Cognition: Alert;Impulsive Oral Cavity -  Dentition: Adequate natural dentition Vision: Functional for self-feeding Patient Positioning: Upright in bed Baseline Vocal Quality: Wet Volitional Cough: Weak Volitional Swallow: Able to elicit    Oral/Motor/Sensory Function Overall Oral Motor/Sensory Function: Impaired (reduced effort with oral motor tasks. )   Ice Chips Ice chips: Impaired Presentation: Spoon Oral Phase Functional Implications: Prolonged oral transit Pharyngeal Phase Impairments: Decreased hyoid-laryngeal movement;Cough - Immediate (multiple swallows)   Thin Liquid Thin Liquid: Impaired Presentation: Cup;Self Fed Pharyngeal  Phase Impairments: Decreased hyoid-laryngeal movement;Multiple swallows;Wet Vocal Quality;Cough - Immediate    Nectar Thick Nectar Thick Liquid: Not tested   Honey Thick Honey Thick Liquid: Not tested   Puree Puree: Not tested   Solid Solid: Not tested    Stefon Ramthun, Riley Nearing 01/10/2012,8:53 AM

## 2012-01-10 NOTE — Progress Notes (Signed)
Passy muir valve in place by speech therapy, feeds study scheduled for today again.

## 2012-01-10 NOTE — Progress Notes (Addendum)
ANTICOAGULATION CONSULT NOTE - Follow Up Consult  Pharmacy Consult for Heparin Indication: Bilateral cephalic vein thrombosis  No Known Allergies  Patient Measurements: Height: 6' (182.9 cm) Weight: 133 lb 2.5 oz (60.4 kg) IBW/kg (Calculated) : 77.6  Heparin Dosing Weight: 62 kg  Vital Signs: Temp: 98.6 F (37 C) (05/08 0749) Temp src: Oral (05/08 0749) BP: 121/71 mmHg (05/08 0800) Pulse Rate: 97  (05/08 0831) Labs:  Basename 01/10/12 0405 01/09/12 1608 01/09/12 0418 01/08/12 0517  HGB 11.8* -- 11.5* --  HCT 36.2* -- 34.9* 38.0*  PLT 843* -- 895* 955*  APTT -- -- -- --  LABPROT -- -- -- --  INR -- -- -- --  HEPARINUNFRC 0.72* 0.67 0.72* --  CREATININE 0.68 -- 0.65 0.75  CKTOTAL -- -- -- --  CKMB -- -- -- --  TROPONINI -- -- -- --   Estimated Creatinine Clearance: 97.5 ml/min (by C-G formula based on Cr of 0.68).  Assessment: 46 yo male with significant history of IV drug abuse & bilateral cephalic vein thrombosis.   Heparin level slightly supratherapeutic (0.72) this AM.    Goal of Therapy:  Heparin level 0.3-0.7 units/ml Platelet monitoring per protocol: yes   Plan -Decrease heparin drip to 1750 units/hr -F/u with heparin level in AM -CBC daily   Addendum:  Changing heparin to SQ lovenox.  Plan:  Lovenox 60mg  SQ bid

## 2012-01-10 NOTE — Progress Notes (Signed)
PT Cancellation Note  Treatment cancelled today due to patient's refusal to participate.  INGOLD,Zofia Peckinpaugh 01/10/2012, 2:52 PM  [facility] [phone] (pager)

## 2012-01-10 NOTE — Progress Notes (Signed)
    Subjective:  He is stable.  Working with speech therapy---may still have aspiration.    Objective:  Vital Signs in the last 24 hours: Temp:  [97.3 F (36.3 C)-99.2 F (37.3 C)] 98.6 F (37 C) (05/08 0749) Pulse Rate:  [70-104] 97  (05/08 0831) Resp:  [24-35] 24  (05/08 0831) BP: (100-121)/(53-71) 121/71 mmHg (05/08 0800) SpO2:  [88 %-98 %] 95 % (05/08 0831) FiO2 (%):  [12 %-28 %] 12 % (05/08 0831) Weight:  [133 lb 2.5 oz (60.4 kg)] 133 lb 2.5 oz (60.4 kg) (05/08 0038)  Intake/Output from previous day: 05/07 0701 - 05/08 0700 In: 1378 [I.V.:383; NG/GT:795; IV Piggyback:200] Out: 2200 [Urine:2200]   Physical Exam: General: Thin chronically ill appearing Head:  Normocephalic and atraumatic. Lungs: Clear to auscultation and percussion. Heart: Normal S1 and S2.  No murmur, rubs or gallops.  Pulses: Pulses normal in all 4 extremities. Extremities: No clubbing or cyanosis. No edema. Neurologic: Alert and oriented x 3.    Lab Results:  Basename 01/10/12 0405 01/09/12 0418  WBC 13.1* 12.6*  HGB 11.8* 11.5*  PLT 843* 895*    Basename 01/10/12 0405 01/09/12 0418  NA 145 145  K 3.8 3.6  CL 109 109  CO2 26 27  GLUCOSE 108* 105*  BUN 39* 41*  CREATININE 0.68 0.65   No results found for this basename: TROPONINI:2,CK,MB:2 in the last 72 hours Hepatic Function Panel No results found for this basename: PROT,ALBUMIN,AST,ALT,ALKPHOS,BILITOT,BILIDIR,IBILI in the last 72 hours No results found for this basename: CHOL in the last 72 hours No results found for this basename: PROTIME in the last 72 hours  Imaging: Dg Chest Port 1 View  01/09/2012  *RADIOLOGY REPORT*  Clinical Data: Post tracheostomy evaluation.  PORTABLE CHEST - 1 VIEW  Comparison: 01/05/2012  Findings: Tracheostomy good position.  Tip of the tracheostomy tube is 5.7 cm above carina.  Elevated right hemidiaphragm appears similar to priors.  Feeding tube tip not seen.  Mild right basilar atelectasis.  No  pneumothorax or effusion.  IMPRESSION: Satisfactory post tracheostomy appearance.  Similar aeration priors.  Original Report Authenticated By: Elsie Stain, M.D.      Assessment/Plan:  Patient Active Hospital Problem List:  Cardiac arrest (12/25/2011)   Assessment: stable.  ST presently   Plan: continue beta blocker.   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  Likely limited.  May just require imaging study.        Shawnie Pons, MD, [facility], FSCAI 01/10/2012, 9:01 AM

## 2012-01-10 NOTE — Progress Notes (Signed)
Name: Angel [name] DOB: [birth year] MRN: [id number] PCP: No primary provider on file. ADMIT DATE: 12/24/2011 LOS: 17  PCCM PROGRESS NOTE  Brief Patient Profile:45 YO man with no past medical history. His family states that he abuses some perscription drugs and some occasional alcohol. Smokes 2 PPD history. Daughter tried to call him all day but he was sleeping which is unusual for him. His friend found him shaking violently around 8 PM and called 911 who intubated in the field for agonal breathing. He was unresponsive at the time. He was brought to the ED and placed on hypothermia protocol. No ischemic EKG changes on ED arrival. UDS positive for cocaine and THC.    Best Practice: DVT: SQ Heparin >> 4/27 (due to low plat), ScD 4/7 and Argotraban (mod prob HIT) 4/27 > SUP: protonix Nutrition: Tube feed Glycemic control:  Sedation/analgesia: Cont sedation (off precedex 4/26)  Lines / Drains: ETT 4/21>>> 5/2 CVC 4/21>>> Janina Mayo Molli Knock) 5/2>>>  Micro: Blood 4/21>>>HAEMOPHILUS INFLUENZAE Urine 4/21>>>Neg  Sputum 4/21>>>GNR and GPC - NORMAL FLORA ....................... BC 4/27 NTD BAL 4/27 NTD  Urine 4/27 NTD BAL 4/30>>>NTD  Abx Vancomycin 4/21>>>5/1 Primaxin 4/24>>>  No results found for this basename: LATICACIDVEN:5,PCT:5 in the last 168 hours Recent Results (from the past 240 hour(s))  AFB CULTURE WITH SMEAR     Status: Normal (Preliminary result)   Collection Time   01/02/12  3:05 PM      Component Value Range Status Comment   Specimen Description BRONCHIAL ALVEOLAR LAVAGE   Final    Special Requests ADDED 01/02/12 1552   Final    ACID FAST SMEAR NO ACID FAST BACILLI SEEN   Final    Culture     Final    Value: CULTURE WILL BE EXAMINED FOR 6 WEEKS BEFORE ISSUING A FINAL REPORT   Report Status PENDING   Incomplete   FUNGUS CULTURE W SMEAR     Status: Normal (Preliminary result)   Collection Time   01/02/12  3:05 PM      Component Value Range Status Comment   Specimen  Description BRONCHIAL ALVEOLAR LAVAGE   Final    Special Requests ADDED 01/02/12 1552   Final    Fungal Smear NO YEAST OR FUNGAL ELEMENTS SEEN   Final    Culture CULTURE IN PROGRESS FOR FOUR WEEKS   Final    Report Status PENDING   Incomplete   CULTURE, BAL-QUANTITATIVE     Status: Normal   Collection Time   01/02/12  3:05 PM      Component Value Range Status Comment   Specimen Description BRONCHIAL ALVEOLAR LAVAGE LUNG RIGHT LOWER   Final    Special Requests NONE   Final    Gram Stain     Final    Value: RARE WBC PRESENT,BOTH PMN AND MONONUCLEAR     NO SQUAMOUS EPITHELIAL CELLS SEEN     NO ORGANISMS SEEN   Colony Count NO GROWTH   Final    Culture NO GROWTH 2 DAYS   Final    Report Status 01/04/2012 FINAL   Final     Antibiotics: Anti-infectives     Start     Dose/Rate Route Frequency Ordered Stop   01/01/12 1200   imipenem-cilastatin (PRIMAXIN) 500 mg in sodium chloride 0.9 % 100 mL IVPB        500 mg 200 mL/hr over 30 Minutes Intravenous 4 times per day 01/01/12 0943 01/10/12 1004   12/31/11 1400   imipenem-cilastatin (PRIMAXIN)  500 mg in sodium chloride 0.9 % 100 mL IVPB  Status:  Discontinued        500 mg 200 mL/hr over 30 Minutes Intravenous 3 times per day 12/31/11 1121 01/01/12 0943   12/31/11 1300   vancomycin (VANCOCIN) 1,250 mg in sodium chloride 0.9 % 250 mL IVPB  Status:  Discontinued        1,250 mg 166.7 mL/hr over 90 Minutes Intravenous Every 12 hours 12/31/11 1120 01/03/12 1105   12/25/11 0600   piperacillin-tazobactam (ZOSYN) IVPB 3.375 g  Status:  Discontinued        3.375 g 12.5 mL/hr over 240 Minutes Intravenous 3 times per day 12/24/11 2257 12/31/11 1028   12/24/11 2359   vancomycin (VANCOCIN) IVPB 1000 mg/200 mL premix  Status:  Discontinued        1,000 mg 200 mL/hr over 60 Minutes Intravenous Daily at bedtime 12/24/11 2301 12/29/11 1009   12/24/11 2045   piperacillin-tazobactam (ZOSYN) 3.375 g in dextrose 5 % 50 mL IVPB        3.375 g 100 mL/hr  over 30 Minutes Intravenous To [facility] 12/24/11 2011 12/24/11 2206   12/24/11 2015   vancomycin (VANCOCIN) IVPB 1000 mg/200 mL premix  Status:  Discontinued        1,000 mg 200 mL/hr over 60 Minutes Intravenous  Once 12/24/11 2011 12/24/11 2301         BMET    Component Value Date/Time   NA 145 01/10/2012 0405   K 3.8 01/10/2012 0405   CL 109 01/10/2012 0405   CO2 26 01/10/2012 0405   GLUCOSE 108* 01/10/2012 0405   BUN 39* 01/10/2012 0405   CREATININE 0.68 01/10/2012 0405   CALCIUM 9.6 01/10/2012 0405   GFRNONAA >90 01/10/2012 0405   GFRAA >90 01/10/2012 0405   CBC    Component Value Date/Time   WBC 13.1* 01/10/2012 0405   RBC 3.73* 01/10/2012 0405   HGB 11.8* 01/10/2012 0405   HCT 36.2* 01/10/2012 0405   PLT 843* 01/10/2012 0405   MCV 97.1 01/10/2012 0405   MCH 31.6 01/10/2012 0405   MCHC 32.6 01/10/2012 0405   RDW 13.6 01/10/2012 0405   LYMPHSABS 0.8 12/24/2011 2009   MONOABS 0.1 12/24/2011 2009   EOSABS 0.0 12/24/2011 2009   BASOSABS 0.0 12/24/2011 2009   Tests / Events:  4/27 - HIT panel negative, off angiomax back to heparin. 4/28: agitated on wUA but followed commands.   Consults: Neuro (signed off 5/1).  Subjective/Overnight/Interval History: No events overnight.  Vital Signs: Temp:  [97.3 F (36.3 C)-99.2 F (37.3 C)] 98.2 F (36.8 C) (05/08 2005) Pulse Rate:  [70-108] 90  (05/08 2005) Resp:  [24-35] 33  (05/08 2005) BP: (106-123)/(53-75) 118/58 mmHg (05/08 2005) SpO2:  [94 %-100 %] 100 % (05/08 2005) FiO2 (%):  [28 %] 28 % (05/08 1656) Weight:  [60.4 kg (133 lb 2.5 oz)] 60.4 kg (133 lb 2.5 oz) (05/08 0038) I/O last 3 completed shifts: In: 2491 [I.V.:561; NG/GT:1730; IV Piggyback:200] Out: 2776 [Urine:2775; Stool:1]  Intake/Output Summary (Last 24 hours) at 01/10/12 2015 Last data filed at 01/10/12 2004  Gross per 24 hour  Intake   2250 ml  Output   1526 ml  Net    724 ml   Physical Examination: General: Critically ill looking male. On PS.  Neuro: Extremely  agitated on WUA. Moving all 4s. Cough +. Corneals +, REports that he followed commands +   HEENT:  corneals +. No  jaundice Neck: Trach and clearn Cardiovascular:  Normal heart sounds.+ Chest: Cleart to ausculatation Lungs:  Normal chest Abdomen:  Soft, non tender Musculoskeletal:   Normal joints Skin:  intact Extremities:No edema, no cyanosis, no clubbing.  Ventilator settings: Vent Mode:  [-]  FiO2 (%):  [28 %] 28 %  Labs/Radiology: Please see A and P for full labs  Dg Chest Port 1 View  01/09/2012  *RADIOLOGY REPORT*  Clinical Data: Post tracheostomy evaluation.  PORTABLE CHEST - 1 VIEW  Comparison: 01/05/2012  Findings: Tracheostomy good position.  Tip of the tracheostomy tube is 5.7 cm above carina.  Elevated right hemidiaphragm appears similar to priors.  Feeding tube tip not seen.  Mild right basilar atelectasis.  No pneumothorax or effusion.  IMPRESSION: Satisfactory post tracheostomy appearance.  Similar aeration priors.  Original Report Authenticated By: Elsie Stain, M.D.    ASSESSMENT AND PLAN 46 year old male with PMH of cocaine, etoh and drug abuse presenting after being found down in a hotel room positive for cocaine and THC. Patient was noted to have vomitus into his airway upon intubation. RUL and RML infiltrate noted. Patient was started on Abx and hypothermia as well ARDS protocol were started.  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 Plan: Cont supportive care  Cardiac: s/p cardiac arrest.  Shock resolved.  Off pressors. Plan: Transition cardiac medications to enteral  Cath once critical illness has resolved per cardiology.    Pulmonary: VDRF resolved. S/P trach - appears that he will be ready for decannulation soon Plan: SLP for swallow eval and PMV   GI: protein calorie malnutrition.   Plan: Continue TF.  ID: PNA, likely aspiration.  H flu in BAL. Has completed 14 days abx Plan: Monitor off abx  Heme: DVT in bilateral upper  ext. Thrombocytopenia resolved.  Neg HITT panel. Plan: Change UFH to LMWH  Hold coumadin for now.  Endo: Hyperglycemia - very mild. No hx of DM  D/C SSI    Billy Fischer 01/10/2012, 8:15 PM

## 2012-01-11 ENCOUNTER — Inpatient Hospital Stay (HOSPITAL_COMMUNITY): Payer: Medicaid Other

## 2012-01-11 DIAGNOSIS — R1310 Dysphagia, unspecified: Secondary | ICD-10-CM

## 2012-01-11 DIAGNOSIS — I82622 Acute embolism and thrombosis of deep veins of left upper extremity: Secondary | ICD-10-CM

## 2012-01-11 DIAGNOSIS — Z930 Tracheostomy status: Secondary | ICD-10-CM

## 2012-01-11 LAB — CBC
MCH: 32.3 pg (ref 26.0–34.0)
Platelets: 772 10*3/uL — ABNORMAL HIGH (ref 150–400)
RBC: 3.96 MIL/uL — ABNORMAL LOW (ref 4.22–5.81)
WBC: 11.4 10*3/uL — ABNORMAL HIGH (ref 4.0–10.5)

## 2012-01-11 MED ORDER — FOOD THICKENER (THICKENUP CLEAR)
ORAL | Status: DC | PRN
  Filled 2012-01-11: qty 120

## 2012-01-11 MED ORDER — ASPIRIN 81 MG PO CHEW
81.0000 mg | CHEWABLE_TABLET | Freq: Every day | ORAL | Status: DC
  Administered 2012-01-11 – 2012-01-18 (×8): 81 mg via ORAL
  Filled 2012-01-11 (×7): qty 1
  Filled 2012-01-11: qty 4

## 2012-01-11 MED ORDER — OSMOLITE 1.2 CAL PO LIQD
1000.0000 mL | ORAL | Status: DC
  Administered 2012-01-11 – 2012-01-16 (×5): 1000 mL
  Filled 2012-01-11 (×16): qty 1000

## 2012-01-11 MED ORDER — PRO-STAT SUGAR FREE PO LIQD
30.0000 mL | Freq: Two times a day (BID) | ORAL | Status: DC
  Administered 2012-01-11 – 2012-01-18 (×13): 30 mL
  Filled 2012-01-11 (×17): qty 30

## 2012-01-11 NOTE — Progress Notes (Signed)
Nutrition Follow-up  Pt tolerating PMSV with full supervision after downsize to 6 cuffless shiley, per SLP notes. BSE completed 5/8 recommending objective test to determine safety with POs. MBSS completed this morning recommending pt remain NPO given risk of aspiration with all consistencies.  RN reports pt is tolerating TF well. Per MD, pt will be ready for decannulation of trach soon. Discussed use of alternative TF formula to better meet pt's nutritional needs with RN.  Diet Order:  NPO  TF: Oxepa at 45 ml/hr with 30 ml Prostat QID; provides 2020 kcal, 128 grams protein, 848 ml free water.  Free water: 200 ml TID (additional 600 ml water daily)  Meds: Scheduled Meds:   . antiseptic oral rinse  15 mL Mouth Rinse q12n4p  . chlorhexidine  15 mL Mouth Rinse BID  . enoxaparin (LOVENOX) injection  1 mg/kg Subcutaneous NOW  . enoxaparin (LOVENOX) injection  1 mg/kg Subcutaneous Once  . enoxaparin (LOVENOX) injection  1 mg/kg Subcutaneous Q12H  . feeding supplement  30 mL Per Tube QID  . folic acid  1 mg Oral Daily  . free water  200 mL Per Tube Q8H  . haloperidol lactate  5 mg Intravenous Q6H  . metoprolol tartrate  25 mg Per Tube TID  . sodium chloride  3 mL Intravenous Q12H  . DISCONTD: enalaprilat  0.625 mg Intravenous Q6H  . DISCONTD: metoprolol  5 mg Intravenous Q4H  . DISCONTD: mulitivitamin  5 mL Oral Daily  . DISCONTD: pantoprazole sodium  40 mg Per Tube Q1200  . DISCONTD: QUEtiapine  25 mg Oral BID  . DISCONTD: thiamine  100 mg Oral Daily   Continuous Infusions:   . sodium chloride Stopped (01/10/12 1400)  . feeding supplement (OXEPA) 1,000 mL (01/11/12 0532)  . DISCONTD: heparin Stopped (01/10/12 1400)   PRN Meds:.sodium chloride, sodium chloride, acetaminophen (TYLENOL) oral liquid 160 mg/5 mL, fentaNYL, food thickener, ondansetron, sodium chloride, DISCONTD: midazolam  Labs:  CMP     Component Value Date/Time   NA 145 01/10/2012 0405   K 3.8 01/10/2012 0405   CL 109  01/10/2012 0405   CO2 26 01/10/2012 0405   GLUCOSE 108* 01/10/2012 0405   BUN 39* 01/10/2012 0405   CREATININE 0.68 01/10/2012 0405   CALCIUM 9.6 01/10/2012 0405   PROT 5.8* 01/02/2012 0430   ALBUMIN 2.0* 01/02/2012 0430   AST 34 01/02/2012 0430   ALT 75* 01/02/2012 0430   ALKPHOS 186* 01/02/2012 0430   BILITOT 0.5 01/02/2012 0430   GFRNONAA >90 01/10/2012 0405   GFRAA >90 01/10/2012 0405  Phosphorus 4.2 WNL Magnesium 2.0 WNL  CBG (last 3)  No results found for this basename: GLUCAP:3 in the last 72 hours   Intake/Output Summary (Last 24 hours) at 01/11/12 1126 Last data filed at 01/11/12 1000  Gross per 24 hour  Intake   2003 ml  Output   1425 ml  Net    578 ml   Ht: 6' (1.829 m)  Weight Status:  138 lb; weights between 132 - 138 lb x 1 week; admit wt 154 lb  BMI 18.7 - WNL  Re-estimated needs:  1800-2000 kcal, 90-110 gm protein  Nutrition Dx:  Inadequate oral intake, ongoing.  Goal:  TF to meet >90% of estimated nutrition needs, met.  Intervention:   1. To better meet current nutrition needs and possible long term nutrition support needs, recommend discontinuing Oxepa formula. Initiate Osmolite 1.2 formula at 45 ml/hr, advance by 10 ml/hr every 4 hours, to  goal of 65 ml/hr. 30 ml Prostat liquid protein via tube BID. Goal TF regimen will provide: 2072 kcal, 117 grams protein, 1280 ml free water.  2. Recommend free water of 200 ml QID 3. RD to continue to follow  Monitor:  Initiation of POs, PEG placement, weights, labs, I/O's  Adair Laundry Pager #:  [phone]

## 2012-01-11 NOTE — Progress Notes (Addendum)
Order to transfer pt to telemetry floor.  Pt notified.  Attempted to call pt mom and daughter, mother's phone rang without answer and daughter's phone had message that she was unable to take call.  Pt belongings packed.  Pt to be taken in [facility] with all belongings to 4733.  Report called to [facility] RN 4700.    Spoke with pt mom and updated on transfer.  Pt aware.  Pt transferred at 1600hrs.  Bed alarm on on 4743 and nurse notified high fall risk.

## 2012-01-11 NOTE — Progress Notes (Signed)
CSW unable to reach pt family by phone. Will attempt to contact pt mother 5/10 to discuss disposition, will work with pt as appropriate to pt willingness to participate.  Baxter Flattery, MSW [phone]

## 2012-01-11 NOTE — Progress Notes (Signed)
Name: Angel [name] DOB: September 15, 1965 MRN: [id number] PCP: No primary provider on file. ADMIT DATE: 12/24/2011 LOS: 18  PCCM PROGRESS NOTE  Brief Patient Profile:45 YO man with no past medical history. His family states that he abuses some perscription drugs and some occasional alcohol. Smokes 2 PPD history. Daughter tried to call him all day but he was sleeping which is unusual for him. His friend found him shaking violently around 8 PM and called 911 who intubated in the field for agonal breathing. He was unresponsive at the time. He was brought to the ED and placed on hypothermia protocol. No ischemic EKG changes on ED arrival. UDS positive for cocaine and THC.    Lines / Drains: ETT 4/21>>> 5/2 CVC 4/21>>> Janina Mayo Molli Knock) 5/2>>>  Micro: Blood 4/21>>>HAEMOPHILUS INFLUENZAE Urine 4/21>>>Neg  Sputum 4/21>>>GNR and GPC - NORMAL FLORA BC 4/27 NTD BAL 4/27 NTD  Urine 4/27 NTD BAL 4/30>>>NTD  Abx Vancomycin 4/21>>>5/1 Primaxin 4/24>>> off  SUBJ: No distress, no new complaints  BMET    Component Value Date/Time   NA 145 01/10/2012 0405   K 3.8 01/10/2012 0405   CL 109 01/10/2012 0405   CO2 26 01/10/2012 0405   GLUCOSE 108* 01/10/2012 0405   BUN 39* 01/10/2012 0405   CREATININE 0.68 01/10/2012 0405   CALCIUM 9.6 01/10/2012 0405   GFRNONAA >90 01/10/2012 0405   GFRAA >90 01/10/2012 0405   CBC    Component Value Date/Time   WBC 11.4* 01/11/2012 0430   RBC 3.96* 01/11/2012 0430   HGB 12.8* 01/11/2012 0430   HCT 38.4* 01/11/2012 0430   PLT 772* 01/11/2012 0430   MCV 97.0 01/11/2012 0430   MCH 32.3 01/11/2012 0430   MCHC 33.3 01/11/2012 0430   RDW 13.4 01/11/2012 0430   LYMPHSABS 0.8 12/24/2011 2009   MONOABS 0.1 12/24/2011 2009   EOSABS 0.0 12/24/2011 2009   BASOSABS 0.0 12/24/2011 2009    Subjective/Overnight/Interval History: No events overnight. No distress, failed swallow eval 5/8  Vital Signs: Temp:  [97.3 F (36.3 C)-98.6 F (37 C)] 97.3 F (36.3 C) (05/09 1702) Pulse Rate:  [75-93] 79  (05/09  1744) Resp:  [20-33] 20  (05/09 1744) BP: (113-133)/(58-83) 122/75 mmHg (05/09 1702) SpO2:  [98 %-100 %] 98 % (05/09 1744) FiO2 (%):  [28 %] 28 % (05/09 1744) Weight:  [58.2 kg (128 lb 4.9 oz)-62.7 kg (138 lb 3.7 oz)] 58.2 kg (128 lb 4.9 oz) (05/09 1702) I/O last 3 completed shifts: In: 2736 [I.V.:181; NG/GT:2555] Out: 1776 [Urine:1775; Stool:1]  Intake/Output Summary (Last 24 hours) at 01/11/12 1929 Last data filed at 01/11/12 1600  Gross per 24 hour  Intake   1578 ml  Output   1200 ml  Net    378 ml   Physical Examination: General: Comfortable on ATC Neuro: Calm, MAEs, appropriate, no focal deficts  HEENT:  WNL Neck: Trach and clean Cardiovascular: RRR s M Chest: Clear to ausculatation Abdomen:  Soft, non tender, NABS Extremities:No edema, no cyanosis, no clubbing.  Ventilator settings: Vent Mode:  [-]  FiO2 (%):  [28 %] 28 %  Labs/Radiology: Please see A and P for full labs  No new CXR  ASSESSMENT AND PLAN 46 year old male with PMH of cocaine, etoh and drug abuse presenting after being found down in a hotel room positive for cocaine and THC. Patient was noted to have vomitus into his airway upon intubation. RUL and RML infiltrate noted. Patient was started on Abx and hypothermia as well ARDS  protocol were started.  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 Plan: Cont supportive care  Cardiac: s/p cardiac arrest.  Shock resolved.  Off pressors. Plan: Transition cardiac medications to enteral  Further ischemia eval per Cards.  Add baby ASA  Pulmonary: VDRF resolved.  -S/P trach - appears that he will be ready for decannulation soon  To remain NPO and cont Panda TFs. Re-eval swallow after decannulation   GI: protein calorie malnutrition.   Plan: Continue TF.  ID: PNA, likely aspiration.  H flu in BAL. Has completed 14 days abx Plan: Monitor off abx  Heme: DVT in bilateral upper ext. Thrombocytopenia resolved.  Neg HITT  panel. Plan: Change UFH to LMWH  Hold coumadin for now.  Endo: Hyperglycemia - very mild. No hx of DM  D/C SSI    Billy Fischer 01/11/2012, 7:29 PM

## 2012-01-11 NOTE — Procedures (Signed)
Objective Swallowing Evaluation: Modified Barium Swallowing Study  Patient Details  Name: Angel [name] MRN: [id number] Date of Birth: 1965/11/29  Today's Date: 01/11/2012 Time: 0900-0930 SLP Time Calculation (min): 30 min  Past Medical History:  Past Medical History  Diagnosis Date  . No significant past medical history    Past Surgical History: History reviewed. No pertinent past surgical history. HPI:  46 yr old admitted with cardiac arrest, + cocaine, likely drug overdose.  Intubated 4/21-5/2.  Trach 5/2 (no PEG as previously documented, pt with PANDA will need PEG if dysphagia is severe). Pt tolerating PMSV with full supervision after downsize to 6 cuffless shiley. Bedside swallow eval revealed overt signs of aspiration. objective test needed to determine safety with POs.      Assessment / Plan / Recommendation Clinical Impression  Dysphagia Diagnosis: Moderate oral phase dysphagia;Severe cervical esophageal phase dysphagia Clinical impression: Pt presents with a moderate oral dysphagia and severe phayrngeal dysphgia dure to decreased cognition, sensory deficits and standing secretions in pharynx. With all trials the pt was observed to have poor attention to bolus with tongue pumping, slow oral transit, piecemeal transit and oral residue remaining post swallow. Pt with a significant delay in swallow initiation to the valleculae and pyrifroms even with teaspoons of thick liquids and puree allowing for silent aspiration before the swallow. Motor function is adequate with residuals primarily caused by poor effort with base of tongue propulsion and diffuse thick secretions in pharynx. At this time the pt is not safe to initiate a PO diet.given risk of aspiration with all consistencies. SLP will provide therapeutic trials at bedside with hopeful initiation of diet early next week with repeat MBS. Pt may improve swallow function with decannulation as reduced secretions may improve sensation.       Treatment Recommendation  Therapy as outlined in treatment plan below    Diet Recommendation NPO;Alternative means - temporary   Other  Recommendations     Follow Up Recommendations       Frequency and Duration min 3x week  2 weeks   Pertinent Vitals/Pain NA    SLP Swallow Goals Goal #3: Pt will consume trials of puree and honey thick liquids via teaspoon with SLP only with adequate swallow response and intermittent cued swallow with moderate verbal cues.    General HPI: 46 yr old admitted with cardiac arrest, + cocaine, likely drug overdose.  Intubated 4/21-5/2.  Trach 5/2 (no PEG as previously documented, pt with PANDA will need PEG if dysphagia is severe). Pt tolerating PMSV with full supervision after downsize to 6 cuffless shiley. Bedside swallow eval revealed overt signs of aspiration. objective test needed to determine safety with POs.  Type of Study: Modified Barium Swallowing Study Diet Prior to this Study: NPO;Panda Temperature Spikes Noted: No Respiratory Status: Trach Trach Size and Type: #6;Uncuffed;With PMSV in place History of Intubation: Yes Behavior/Cognition: Alert;Requires cueing;Distractible;Impulsive;Decreased sustained attention Oral Cavity - Dentition: Adequate natural dentition Oral Motor / Sensory Function: Impaired - see Bedside swallow eval Vision: Functional for self-feeding Patient Positioning: Upright in chair Baseline Vocal Quality: Wet Volitional Cough: Weak Volitional Swallow: Able to elicit Anatomy: Within functional limits Pharyngeal Secretions: Not observed secondary MBS (Suspect standing secretions in pharynx)    Reason for Referral dysphagia  Oral Phase     Pharyngeal Phase Pharyngeal Phase: Impaired   Cervical Esophageal Phase Cervical Esophageal Phase: [facility]    Angel [name], Angel [name] 01/11/2012, 9:59 AM

## 2012-01-11 NOTE — Progress Notes (Signed)
Physical Therapy Treatment Patient Details Name: Angel [name] MRN: [id number] DOB: [birth year] Today's Date: 01/11/2012 Time: 3086-5784 PT Time Calculation (min): 22 min  PT Assessment / Plan / Recommendation Comments on Treatment Session  Patient with respiratory failure with decr mobility due to long hospitalization with trach currently.  Continues to benefit from PT to address balance and endurance issues.  Patient now going SNF per chart and PT agrees.      Follow Up Recommendations  Skilled nursing facility;Supervision/Assistance - 24 hour    Equipment Recommendations  Defer to next venue    Frequency Min 3X/week   Plan Discharge plan needs to be updated;Frequency remains appropriate    Precautions / Restrictions Precautions Precautions: Fall Precaution Comments: trach collar at 28% Restrictions Weight Bearing Restrictions: No   Pertinent Vitals/Pain VSS/ No pain    Mobility  Bed Mobility Bed Mobility: Rolling Left;Left Sidelying to Sit;Sitting - Scoot to Edge of Bed Rolling Right: Not tested (comment) Rolling Left: 4: Min guard;With rail Right Sidelying to Sit: Not tested (comment) Left Sidelying to Sit: 4: Min guard;With rails Sitting - Scoot to Edge of Bed: 5: Supervision Details for Bed Mobility Assistance: Cues only Transfers Transfers: Sit to Stand;Stand to Sit Sit to Stand: 4: Min guard;With upper extremity assist;From bed;From elevated surface Stand to Sit: 4: Min guard;With upper extremity assist;To bed Stand Pivot Transfers: Not tested (comment) Details for Transfer Assistance: Patient continues to need cues for hand placement and for safety.  Some unsteadiness on feet initially.   Ambulation/Gait Ambulation/Gait Assistance: 4: Min assist Ambulation Distance (Feet): 175 Feet Assistive device: Rolling walker Ambulation/Gait Assistance Details: Patient continues to need cues for safety and assist to steer RW.  Also needs cues for full upright posture and to  stay close to Rw.  Continues to veer left at time with RW.   Gait Pattern: Step-to pattern;Decreased stride length;Ataxic;Trunk flexed;Narrow base of support Stairs: No Corporate treasurer: No         PT Goals Acute Rehab PT Goals PT Goal: Supine/Side to Sit - Progress: Progressing toward goal PT Goal: Sit at Edge Of Bed - Progress: Progressing toward goal PT Goal: Sit to Supine/Side - Progress: Progressing toward goal PT Goal: Sit to Stand - Progress: Progressing toward goal PT Goal: Stand to Sit - Progress: Progressing toward goal PT Goal: Ambulate - Progress: Progressing toward goal  Visit Information  Last PT Received On: 01/11/12 Assistance Needed: +1    Subjective Data  Subjective: Patient asked for PT to check and see why gown was wet.  His tube feed was leaking out because it was not turned properly.  This PT fixed the tube feed and cleaned the patient and replaced new gown and linens.    Cognition  Overall Cognitive Status: Appears within functional limits for tasks assessed/performed Arousal/Alertness: Awake/alert Orientation Level: Oriented X4 / Intact Behavior During Session:  (Impulsivity improving)    Balance  Static Sitting Balance Static Sitting - Balance Support: Bilateral upper extremity supported;Feet supported Static Sitting - Level of Assistance: 6: Modified independent (Device/Increase time)  End of Session PT - End of Session Equipment Utilized During Treatment: Gait belt Activity Tolerance: Patient limited by fatigue Patient left: with call bell/phone within reach;in bed Nurse Communication: Mobility status    Angel [name] 01/11/2012, 10:40 AM  [facility] [phone] (pager)

## 2012-01-11 NOTE — Progress Notes (Signed)
Patient: Angel [name] Date of Encounter: 01/11/2012, 4:48 PM Admit date: 12/24/2011     Subjective  Was moved to 4700 today. Denies pain. Feels somewhat congested. No SOB.    Objective   Telemetry: NSR (just arrived to 4700) Physical Exam: Filed Vitals:   01/11/12 1400  BP: 114/62  Pulse: 76  Temp: 98.2  Resp: 27, 99% trach collar   General: Well developed thin WM in no acute distress. Head: Normocephalic, atraumatic, sclera non-icteric, no xanthomas, nares are without discharge. Neck: Unable to assess JVD due to trache collar. Lungs: Coarse anteriorly. Breathing is unlabored. Heart: RRR S1 S2 without murmurs, rubs, or gallops.  Abdomen: Soft, non-tender, non-distended with normoactive bowel sounds. No hepatomegaly. No rebound/guarding. No obvious abdominal masses. Msk:  Strength and tone appear normal for age. Extremities: No clubbing or cyanosis. No edema.  Distal pedal pulses are 2+ and equal bilaterally. Neuro: Alert and oriented X 3 but speaks slowly. Moves all extremities spontaneously. Psych:  Responds to questions appropriately with a flat affect.    Intake/Output Summary (Last 24 hours) at 01/11/12 1648 Last data filed at 01/11/12 1600  Gross per 24 hour  Intake   1773 ml  Output   1200 ml  Net    573 ml    Inpatient Medications:    . antiseptic oral rinse  15 mL Mouth Rinse q12n4p  . chlorhexidine  15 mL Mouth Rinse BID  . enoxaparin (LOVENOX) injection  1 mg/kg Subcutaneous Once  . enoxaparin (LOVENOX) injection  1 mg/kg Subcutaneous Q12H  . feeding supplement  30 mL Per Tube BID  . free water  200 mL Per Tube Q8H  . metoprolol tartrate  25 mg Per Tube TID  . sodium chloride  3 mL Intravenous Q12H  . DISCONTD: feeding supplement  30 mL Per Tube QID  . DISCONTD: folic acid  1 mg Oral Daily  . DISCONTD: haloperidol lactate  5 mg Intravenous Q6H    Labs:  Basename 01/10/12 0405 01/09/12 0418  NA 145 145  K 3.8 3.6  CL 109 109  CO2 26 27  GLUCOSE  108* 105*  BUN 39* 41*  CREATININE 0.68 0.65  CALCIUM 9.6 9.3  MG -- 2.0  PHOS -- 4.2    Basename 01/11/12 0430 01/10/12 0405  WBC 11.4* 13.1*  NEUTROABS -- --  HGB 12.8* 11.8*  HCT 38.4* 36.2*  MCV 97.0 97.1  PLT 772* 843*    Radiology/Studies:   1. CXR 01/09/12 - Findings: Tracheostomy good position. Tip of the tracheostomy tube is 5.7 cm above carina. Elevated right hemidiaphragm appears similar to priors. Feeding tube tip not seen. Mild right basilar atelectasis. No pneumothorax or effusion. IMPRESSION: Satisfactory post tracheostomy appearance. Similar aeration priors.  2. Dg Abd Portable 1v 01/05/2012  *RADIOLOGY REPORT*  Clinical Data: Panda tube placement  PORTABLE ABDOMEN - 1 VIEW  Comparison: Jan 04, 2012  Findings: The panda tube tip is coiled within the body of the stomach.  The stool and bowel gas pattern is within normal limits with no evidence of obstruction or gross pneumoperitoneum.  IMPRESSION: Panda tube tip in gastric body.  Original Report Authenticated By: Brandon Melnick, M.D.   3. Dg Abd Portable 1v 01/04/2012  *RADIOLOGY REPORT*  Clinical Data: Panda tube placement.  PORTABLE ABDOMEN - 1 VIEW  Comparison: None  Findings: The Panda tube tip is in the fundal region of the stomach laterally.  IMPRESSION: Panda tube tip in the fundal region of the  stomach.  Original Report Authenticated By: P. Loralie Champagne, M.D.   5. Most recent echo: 01/08/12 Study Conclusions Left ventricle: The cavity size was normal. Wall thickness was normal. Systolic function was normal. The estimated ejection fraction was in the range of 60% to 65%   Assessment and Plan   1. Cardiac arrest in the setting of polysubstance abuse including cocaine/THC  2. NSTEMI earlier this admission - From cardiac standpoint, currently stable. LV function has normalized. He may only require Myoview imaging at this point in light of recovery of EF (per Dr. Rosalyn Charters prior note, he will review with colleagues).  Continue BB given NSTEMI - it will be important for him to abstain from cocaine. Consider addition of low-dose ASA. Will send for lipid panel tomorrow.  3. Initial LV dysfunction, improved to EF 60-65% - see above.  4. ID: PNA, likely aspiration, Blood cx + for H Flu 4/27. Remains afebrile, off antibiotics. Question utility of repeat blood cx to ensure clearing given persistent leukocytosis.   5. Heme/onc: initially had thrombocytopenia which has resolved (neg HIT panel) but now peripheral smear confirming marked thrombocytosis,? reactive thrombocytosis.  6. Acute respiratory failure - trach collar in place, may be nearing decannulation soon.    7. Post anoxic encephalopathy - improving.  8. Bilateral superficial cephalic vein thromboses - See dopplers from earlier on this hospitalization. Have raised question of whether or not this truly requires anticoagulation - will defer to PCCM to review, given reference to DVT in prior notes.  Signed, Ronie Spies PA-C   Agree with the above.  Patient has multiple issues which need resolution.  He can be evaluated once these are resolved.  Will continue to follow with you.

## 2012-01-12 LAB — LIPID PANEL
Cholesterol: 114 mg/dL (ref 0–200)
HDL: 43 mg/dL (ref >39)
Total CHOL/HDL Ratio: 2.7 RATIO

## 2012-01-12 LAB — GLUCOSE, CAPILLARY: Glucose-Capillary: 107 mg/dL — ABNORMAL HIGH (ref 70–99)

## 2012-01-12 MED ORDER — PATIENT'S GUIDE TO USING COUMADIN BOOK
Freq: Once | Status: AC
  Administered 2012-01-12: 18:00:00
  Filled 2012-01-12: qty 1

## 2012-01-12 MED ORDER — WARFARIN VIDEO
Freq: Once | Status: AC
  Administered 2012-01-13: 10:00:00

## 2012-01-12 MED ORDER — WARFARIN SODIUM 7.5 MG PO TABS
7.5000 mg | ORAL_TABLET | Freq: Once | ORAL | Status: AC
  Administered 2012-01-12: 7.5 mg via ORAL
  Filled 2012-01-12: qty 1

## 2012-01-12 MED ORDER — WARFARIN - PHARMACIST DOSING INPATIENT
Freq: Every day | Status: DC
  Administered 2012-01-12 – 2012-01-15 (×4)

## 2012-01-12 NOTE — Progress Notes (Signed)
Passy-Muir Speaking Valve - Treatment Patient Details  Name: Angel [name] MRN: [id number] Date of Birth: 16-Feb-1966  Today's Date: 01/12/2012 Time: 1125-1200 SLP Time Calculation (min): 35 min  Past Medical History:  Past Medical History  Diagnosis Date  . No significant past medical history    Past Surgical History: History reviewed. No pertinent past surgical history.  Assessment / Plan / Recommendation Clinical Impression  Pt with adequate toelrance of PMSV valve with no change in vital signs throughout session. Able to achieve phonation at conversation level, especially as attention and alertness increased following consistent verbal cues and stimulation by SLP. Educated pt, family and nursing regarding safety with valve. await possible decannulation per MD.   SLP provided trials of puree with moderate, branching to min/no cues for effortful swallows and hard cough. Initially pt with poor management of secretions, much improved at end of session with verbal stimulation and activity. Pt with improved prognosis for diet initiation next week.   Plan  Continue with current plan of care    Follow Up Recommendations  Skilled Nursing facility    Pertinent Vitals/Pain None    SLP Goals Potential to Achieve Goals: Good SLP Goal #1: Pt will demonstrate emergent awareness of deficits during functional tasks with min verbal cues SLP Goal #1 - Progress: Progressing toward goal SLP Goal #2: Pt will complete basic functional problem solving tasks with minimal contextual and verbal cues.  SLP Goal #2 - Progress: Progressing toward goal SLP Goal #3: Pt will increase volume of speech with minimal verbal cues at conversation level.  SLP Goal #3 - Progress: Progressing toward goal SLP Goal #4: Pt will increase strength of cough for oral expectoration of secretions with moderate verbal cues.  SLP Goal #4 - Progress: Progressing toward goal   PMSV Trial  PMSV was placed for: 40 minutes Able  to redirect subglottic air through upper airway: Yes Able to Attain Phonation: Yes Voice Quality: Wet Able to Expectorate Secretions: No Level of Secretion Expectoration with PMSV: Tracheal Breath Support for Phonation: Mildly decreased Intelligibility: Intelligible SpO2 During Trial: 97 % Behavior: Poor eye contact;Quiet;Responsive to questions   Tracheostomy Tube  Additional Tracheostomy Tube Assessment Trach Collar Period: 24 hours Secretion Description: none visualized Frequency of Tracheal Suctioning: several times a day Level of Secretion Expectoration: Tracheal    Vent Dependency  Vent Dependent: No FiO2 (%): 28 %    Cuff Deflation Trial Tolerated Cuff Deflation: Yes (baseline) Behavior: Alert;Confused;Poor eye contact   Yousuf Ager, Riley Nearing 01/12/2012, 3:43 PM

## 2012-01-12 NOTE — Progress Notes (Signed)
Acute PT Discharge Note: 01/12/2012  Patient is being discharged from [facility] secondary to Physician Order to discontinue PT.   Please see latest Therapy Progress Note for current level of functioning and progress toward goals.    Acute PT signing off.  Thayer Inabinet L. Rita Vialpando DPT [phone]

## 2012-01-12 NOTE — Progress Notes (Signed)
Name: Angel [name] DOB: May 07, 1966 MRN: [id number] PCP: No primary provider on file. ADMIT DATE: 12/24/2011 LOS: 19  PCCM PROGRESS NOTE  Brief Patient Profile:46 YO man with no past medical history. His family states that he abuses some perscription drugs and some occasional alcohol. Smokes 2 PPD history. Daughter tried to call him all day but he was sleeping which is unusual for him. His friend found him shaking violently around 8 PM and called 911 who intubated in the field for agonal breathing. He was unresponsive at the time. He was brought to the ED and placed on hypothermia protocol. No ischemic EKG changes on ED arrival. UDS positive for cocaine and THC.    Lines / Drains: ETT 4/21>>> 5/2 CVC 4/21>>> out Janina Mayo Molli Knock) 5/2>>>  Micro: Blood 4/21>>>HAEMOPHILUS INFLUENZAE Urine 4/21>>>Neg  Sputum 4/21>>>GNR and GPC - NORMAL FLORA BC 4/27 NTD BAL 4/27 NTD  Urine 4/27 NTD BAL 4/30>>>NTD  Abx Vancomycin 4/21>>>5/1 Primaxin 4/24>>> off  SUBJ: No distress  BMET    Component Value Date/Time   NA 145 01/10/2012 0405   K 3.8 01/10/2012 0405   CL 109 01/10/2012 0405   CO2 26 01/10/2012 0405   GLUCOSE 108* 01/10/2012 0405   BUN 39* 01/10/2012 0405   CREATININE 0.68 01/10/2012 0405   CALCIUM 9.6 01/10/2012 0405   GFRNONAA >90 01/10/2012 0405   GFRAA >90 01/10/2012 0405   CBC    Component Value Date/Time   WBC 11.4* 01/11/2012 0430   RBC 3.96* 01/11/2012 0430   HGB 12.8* 01/11/2012 0430   HCT 38.4* 01/11/2012 0430   PLT 772* 01/11/2012 0430   MCV 97.0 01/11/2012 0430   MCH 32.3 01/11/2012 0430   MCHC 33.3 01/11/2012 0430   RDW 13.4 01/11/2012 0430   LYMPHSABS 0.8 12/24/2011 2009   MONOABS 0.1 12/24/2011 2009   EOSABS 0.0 12/24/2011 2009   BASOSABS 0.0 12/24/2011 2009    Subjective/Overnight/Interval History: No events overnight. No distress, failed swallow eval 5/9  Vital Signs: Temp:  [97.3 F (36.3 C)-98.1 F (36.7 C)] 98.1 F (36.7 C) (05/10 0500) Pulse Rate:  [75-98] 90  (05/10 1250) Resp:   [18-20] 20  (05/10 1159) BP: (116-124)/(70-80) 116/70 mmHg (05/10 1250) SpO2:  [95 %-98 %] 97 % (05/10 1159) FiO2 (%):  [28 %] 28 % (05/10 1159) Weight:  [58.2 kg (128 lb 4.9 oz)-58.242 kg (128 lb 6.4 oz)] 58.242 kg (128 lb 6.4 oz) (05/10 0500) I/O last 3 completed shifts: In: 2676 [I.V.:166; NG/GT:2510] Out: 2400 [Urine:2400]  Intake/Output Summary (Last 24 hours) at 01/12/12 1453 Last data filed at 01/12/12 1226  Gross per 24 hour  Intake   1098 ml  Output   1901 ml  Net   -803 ml   Physical Examination: General: Comfortable on ATC Neuro: Calm, MAEs, appropriate, no focal deficts  HEENT:  WNL Neck: Trach and clean Cardiovascular: RRR s M Chest: Clear to ausculatation Abdomen:  Soft, non tender, NABS Extremities:No edema, no cyanosis, no clubbing.  Ventilator settings: Vent Mode:  [-]  FiO2 (%):  [28 %] 28 %  Labs/Radiology: Please see A and P for full labs  No new CXR  ASSESSMENT AND PLAN 46 year old male with PMH of cocaine, etoh and drug abuse presenting after being found down in a hotel room positive for cocaine and THC. Patient was noted to have vomitus into his airway upon intubation. RUL and RML infiltrate noted. COmpleted  hypothermia and ARDS protocol.  Neuro: Post anoxic encephalopathy - appears to  be much improved. Plan: Cont supportive care  Cardiac: s/p cardiac arrest.  Shock resolved.  Plan: Transitioned cardiac medications to enteral  LV function has normalized. He may only require Myoview imaging at this point in light of recovery of EF (per Dr. Rosalyn Charters prior note, he will review with colleagues). Continue BB given NSTEMI - it will be important for him to abstain from cocaine Further ischemia eval per Cards.  Add baby ASA  Pulmonary: VDRF resolved.  -S/P trach - May consider decannulation in future  To remain NPO and cont Panda TFs. Re-eval swallow after decannulation   GI: protein calorie malnutrition.   Plan: Continue TF.  ID: PNA, likely  aspiration.  H flu in BAL. Has completed 14 days abx Plan: Monitor off abx  Heme: DVT in bilateral upper ext. Thrombocytopenia resolved.  Neg HITT panel. Plan: Change UFH to LMWH  Start coumadin -since cath not being considered .  Endo: Hyperglycemia - very mild. No hx of DM  D/C SSI    Haward Pope V. 01/12/2012, 2:53 PM

## 2012-01-12 NOTE — Progress Notes (Signed)
This CSW handing off to 4700 CSW (Leslie/Amy) to continue d/c planning and address PSA.  Pt has been faxed out to SNFs in [facility].   MD, please sign FL2 and 30 day note located in shadow chart.  Baxter Flattery, MSW [phone]

## 2012-01-12 NOTE — Progress Notes (Addendum)
ANTICOAGULATION CONSULT NOTE - Follow Up Consult  Pharmacy Consult for Lovenox>>warfarin Indication: Bilateral cephalic vein thromboses  No Known Allergies  Patient Measurements: Height: 6\' 2"  (188 cm) Weight: 128 lb 6.4 oz (58.242 kg) IBW/kg (Calculated) : 82.2   Vital Signs: Temp: 98.1 F (36.7 C) (05/10 0500) Temp src: Oral (05/10 0500) BP: 124/76 mmHg (05/10 0500) Pulse Rate: 80  (05/10 0500)  Labs:  Basename 01/11/12 0430 01/10/12 0405 01/09/12 1608  HGB 12.8* 11.8* --  HCT 38.4* 36.2* --  PLT 772* 843* --  APTT -- -- --  LABPROT -- -- --  INR -- -- --  HEPARINUNFRC -- 0.72* 0.67  CREATININE -- 0.68 --  CKTOTAL -- -- --  CKMB -- -- --  TROPONINI -- -- --    Estimated Creatinine Clearance: 94 ml/min (by C-G formula based on Cr of 0.68).   Medications:  Lovenox 60mg  SQ q12h  Assessment: 46yom who was on Heparin drip now transitioned to full dose Lovenox for bilateral cephalic vein thromboses. Patient's renal function has continued to improve during admission. - Wt: 58.2kg, CrCl 94 ml/min - H/H stable, Plts 772 (trending down) - HIT panel negative  Goal of Therapy:  Anti-Xa level 0.6-1.2 units/ml 4hrs after LMWH dose given Monitor platelets by anticoagulation protocol: Yes   Plan:  1. Continue Lovenox 60mg  SQ q12h 2. CBC q72h   Cleon Dew [phone] 01/12/2012,9:38 AM   Addendum: Per CCM consult note, no further plans for cardiac work-up or cath. Orders to start warfarin tonight. Calculated warfarin score of 6 based on wt/age. Last INR is from 18d ago, will recheck INR prior to dose tonight.  Plan: Warfarin 7.5mg  tonight Warfarin education ordered INR now then daily  Sheppard Coil PharmD

## 2012-01-12 NOTE — Progress Notes (Signed)
Patient ID: Angel [name], male   DOB: July 18, 1966, 46 y.o.   MRN: [id number] Chart reviewed. Nothing substantial to add at this point. Will follow.

## 2012-01-12 NOTE — Progress Notes (Signed)
PT Note:  01/12/2012 Received order for PT eval and treat.  Pt was discharged from PT services in error.   Will resume acute PT treatment with current plan of care.   Thank you for the re-order.   Jadae Steinke L. Jousha Schwandt DPT [phone]

## 2012-01-13 ENCOUNTER — Inpatient Hospital Stay (HOSPITAL_COMMUNITY): Payer: Medicaid Other

## 2012-01-13 DIAGNOSIS — G9340 Encephalopathy, unspecified: Secondary | ICD-10-CM

## 2012-01-13 LAB — BASIC METABOLIC PANEL
BUN: 34 mg/dL — ABNORMAL HIGH (ref 6–23)
CO2: 27 mEq/L (ref 19–32)
Chloride: 102 mEq/L (ref 96–112)
Creatinine, Ser: 0.63 mg/dL (ref 0.50–1.35)
GFR calc Af Amer: >90 mL/min (ref >90)
Potassium: 3.9 mEq/L (ref 3.5–5.1)

## 2012-01-13 LAB — GLUCOSE, CAPILLARY
Glucose-Capillary: 114 mg/dL — ABNORMAL HIGH (ref 70–99)
Glucose-Capillary: 125 mg/dL — ABNORMAL HIGH (ref 70–99)
Glucose-Capillary: 118 mg/dL — ABNORMAL HIGH (ref 70–99)

## 2012-01-13 LAB — PROTIME-INR: Prothrombin Time: 14.2 seconds (ref 11.6–15.2)

## 2012-01-13 MED ORDER — WARFARIN SODIUM 7.5 MG PO TABS
7.5000 mg | ORAL_TABLET | Freq: Once | ORAL | Status: AC
  Administered 2012-01-13: 7.5 mg via ORAL
  Filled 2012-01-13: qty 1

## 2012-01-13 NOTE — Progress Notes (Signed)
PROGRESS NOTE  Subjective:   Angel [name] is a 46 yo male admitted after having a cardiac arrest.  EF originally 20 % but now it has normalized.  Not having any angina.  Awake but not completely oriented.    Objective:    Vital Signs:   Temp:  [98.4 F (36.9 C)-99.2 F (37.3 C)] 99.2 F (37.3 C) (05/11 0739) Pulse Rate:  [77-115] 115  (05/11 1128) Resp:  [18-21] 20  (05/11 1128) BP: (115-124)/(68-82) 124/82 mmHg (05/11 0739) SpO2:  [95 %-100 %] 95 % (05/11 1128) FiO2 (%):  [28 %] 28 % (05/11 1128) Weight:  [127 lb 12.8 oz (57.97 kg)] 127 lb 12.8 oz (57.97 kg) (05/11 0739)  Last BM Date: 02-05-2012   24-hour weight change: Weight change:   Weight trends: Filed Weights   01/11/12 1702 02-05-2012 0500 01/13/12 0739  Weight: 128 lb 4.9 oz (58.2 kg) 128 lb 6.4 oz (58.242 kg) 127 lb 12.8 oz (57.97 kg)    Intake/Output:  2023/02/05 0701 - 05/11 0700 In: 725 [NG/GT:725] Out: 1621 [Urine:1575; Emesis/NG output:45; Stool:1] Total I/O In: 0  Out: 350 [Urine:350]   Physical Exam: BP 124/82  Pulse 115  Temp(Src) 99.2 F (37.3 C) (Oral)  Resp 20  Ht 6\' 2"  (1.88 m)  Wt 127 lb 12.8 oz (57.97 kg)  BMI 16.41 kg/m2  SpO2 95%  General: Vital signs reviewed and noted. Thin, chronically ill appearing.  Head: Normocephalic, atraumatic.  Eyes: conjunctivae/corneas clear. PERRL, EOM's intact. Fundi benign.  Throat: Trach in place  Neck: Supple. Normal carotids. No JVD  Lungs:  Clear bilaterally to auscultation without wheezes, rales, or rhonchi. Breathing is unlabored.  Heart: Regular rate,  With normal  S1 S2. No murmurs, rubs, or gallops   Abdomen:  Soft, non-tender, non-distended with normoactive bowel sounds. No hepatomegaly. No rebound/guarding. No abdominal masses.  Extremities: No edema.  Distal pedal pulses are 2+ and equal bilaterally.  Neurologic: A&O X3, CN II - XII are grossly intact. Motor strength is 5/5 in the all 4 extremities.  Psych: Slow to respond      Labs: BMET:  Basename 01/13/12 0820  NA 142  K 3.9  CL 102  CO2 27  GLUCOSE 122*  BUN 34*  CREATININE 0.63  CALCIUM 9.5  MG --  PHOS --    Liver function tests: No results found for this basename: AST:2,ALT:2,ALKPHOS:2,BILITOT:2,PROT:2,ALBUMIN:2 in the last 72 hours No results found for this basename: LIPASE:2,AMYLASE:2 in the last 72 hours  CBC:  Basename 01/11/12 0430  WBC 11.4*  NEUTROABS --  HGB 12.8*  HCT 38.4*  MCV 97.0  PLT 772*    Cardiac Enzymes: No results found for this basename: CKTOTAL:4,CKMB:4,TROPONINI:4 in the last 72 hours  Coagulation Studies:  Basename 01/13/12 0820 Feb 05, 2012 1618  LABPROT 14.2 13.5  INR 1.08 1.01    Other: No components found with this basename: POCBNP:3 No results found for this basename: DDIMER in the last 72 hours No results found for this basename: HGBA1C in the last 72 hours  Basename 02/05/12 0615  CHOL 114  HDL 43  LDLCALC 57  TRIG 72  CHOLHDL 2.7   No results found for this basename: TSH,T4TOTAL,FREET3,T3FREE,THYROIDAB in the last 72 hours No results found for this basename: VITAMINB12,FOLATE,FERRITIN,TIBC,IRON,RETICCTPCT in the last 72 hours   Tele:  NSR  Medications:    Infusions:    . sodium chloride Stopped (01/10/12 1400)  . feeding supplement (OSMOLITE 1.2 CAL) 1,000 mL (01/13/12 0047)    Scheduled  Medications:    . antiseptic oral rinse  15 mL Mouth Rinse q12n4p  . aspirin  81 mg Oral Daily  . chlorhexidine  15 mL Mouth Rinse BID  . enoxaparin (LOVENOX) injection  1 mg/kg Subcutaneous Q12H  . feeding supplement  30 mL Per Tube BID  . free water  200 mL Per Tube Q8H  . metoprolol tartrate  25 mg Per Tube TID  . patient's guide to using coumadin book   Does not apply Once  . sodium chloride  3 mL Intravenous Q12H  . warfarin  7.5 mg Oral ONCE-1800  . warfarin  7.5 mg Oral ONCE-1800  . warfarin   Does not apply Once  . Warfarin - Pharmacist Dosing Inpatient   Does not apply q1800     Assessment/ Plan:    1. Cardiac arrest:  EF was 20% but now is 60-65%.  No angina.  He is still not recovered completely from a neuro standpoint but has improved.   Will anticipate doing a myoview at some point assuming that he continues to recover.    Disposition:  Length of Stay: 20  Vesta Mixer, Montez Hageman., MD, [facility] 01/13/2012, 12:37 PM

## 2012-01-13 NOTE — Progress Notes (Signed)
eLink Physician-Brief Progress Note Patient Name: Angel [name] DOB: October 06, 1965 MRN: [id number]  Date of Service  01/13/2012   HPI/Events of Note   ppanda tube in stomach  eICU Interventions  Ok to use   Intervention Category Intermediate Interventions: Diagnostic test evaluation  Arianie Couse 01/13/2012, 3:45 PM

## 2012-01-13 NOTE — Progress Notes (Signed)
Name: Angel [name] DOB: July 01, 1966 MRN: [id number] PCP: No primary provider on file. ADMIT DATE: 12/24/2011 LOS: 20  PCCM PROGRESS NOTE  Brief Patient Profile:45 YO man with no past medical history. His family states that he abuses some perscription drugs and some occasional alcohol. Smokes 2 PPD history. Daughter tried to call him 4/21 all day but he was sleeping which is unusual for him. His friend found him shaking violently around 8 PM and called 911 who intubated in the field for agonal breathing. He was unresponsive at the time. He was brought to the ED 4/21 and placed on hypothermia protocol. No ischemic EKG changes on ED arrival. UDS positive for cocaine and THC.    Lines / Drains: ETT 4/21>>> 5/2 CVC 4/21>>> out Janina Mayo Molli Knock) 5/2>>>  Micro: Blood 4/21>>>HAEMOPHILUS INFLUENZAE Urine 4/21>>>Neg  Sputum 4/21>>>GNR and GPC - NORMAL FLORA BC 4/27 NTD BAL 4/27 NTD  Urine 4/27 NTD BAL 4/30>>>NTD  Abx Vancomycin 4/21>>>5/1 Primaxin 4/24>>> off  SUBJ: No distress  BMET    Component Value Date/Time   NA 142 01/13/2012 0820   K 3.9 01/13/2012 0820   CL 102 01/13/2012 0820   CO2 27 01/13/2012 0820   GLUCOSE 122* 01/13/2012 0820   BUN 34* 01/13/2012 0820   CREATININE 0.63 01/13/2012 0820   CALCIUM 9.5 01/13/2012 0820   GFRNONAA >90 01/13/2012 0820   GFRAA >90 01/13/2012 0820   CBC    Component Value Date/Time   WBC 11.4* 01/11/2012 0430   RBC 3.96* 01/11/2012 0430   HGB 12.8* 01/11/2012 0430   HCT 38.4* 01/11/2012 0430   PLT 772* 01/11/2012 0430   MCV 97.0 01/11/2012 0430   MCH 32.3 01/11/2012 0430   MCHC 33.3 01/11/2012 0430   RDW 13.4 01/11/2012 0430   LYMPHSABS 0.8 12/24/2011 2009   MONOABS 0.1 12/24/2011 2009   EOSABS 0.0 12/24/2011 2009   BASOSABS 0.0 12/24/2011 2009    Subjective/Overnight/Interval History: No events overnight. No distress, failed swallow eval 5/9 but wants to eat  Vital Signs: Temp:  [98.4 F (36.9 C)-99.2 F (37.3 C)] 99.2 F (37.3 C) (05/11 0739) Pulse  Rate:  [77-115] 115  (05/11 1128) Resp:  [18-21] 20  (05/11 1128) BP: (115-124)/(68-82) 124/82 mmHg (05/11 0739) SpO2:  [95 %-100 %] 95 % (05/11 1128) FiO2 (%):  [28 %] 28 % (05/11 1128) Weight:  [127 lb 12.8 oz (57.97 kg)] 127 lb 12.8 oz (57.97 kg) (05/11 0739) I/O last 3 completed shifts: In: 1768 [I.V.:163; NG/GT:1605] Out: 2546 [Urine:2500; Emesis/NG output:45; Stool:1]  Intake/Output Summary (Last 24 hours) at 01/13/12 1339 Last data filed at 01/13/12 1049  Gross per 24 hour  Intake    525 ml  Output   1375 ml  Net   -850 ml   Physical Examination: General: Comfortable on ATC Neuro: Calm, MAEs, appropriate, no focal deficts  HEENT:  WNL Neck: Trach and clean Cardiovascular: RRR s M Chest: Clear to ausculatation Abdomen:  Soft, non tender, NABS Extremities:No edema, no cyanosis, no clubbing.  Ventilator settings: Vent Mode:  [-]  FiO2 (%):  [28 %] 28 %  Labs/Radiology: Please see A and P for full labs  No new CXR  ASSESSMENT AND PLAN 46 year old male with PMH of cocaine, etoh and drug abuse presenting after being found down in a hotel room positive for cocaine and THC. Patient was noted to have vomitus into his airway upon intubation. RUL and RML infiltrate noted. Completed  hypothermia and ARDS protocol.  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 Plan: Cont supportive care  Cardiac: s/p cardiac arrest.  Shock resolved.  Plan: Transitioned cardiac medications to enteral  LV function has normalized. He may only require Myoview imaging at this point in light of recovery of EF (per Dr. Rosalyn Charters prior note, he will review with colleagues). Continue BB given NSTEMI - it will be important for him to abstain from cocaine Further ischemia eval per Cards.  Add baby ASA  Pulmonary: VDRF resolved, still trach and 02 dep -S/P trach - May consider decannulation in future  To remain NPO and cont Panda TFs. Re-eval swallow after decannulation   GI:  protein calorie malnutrition.   Plan: Continue TF.  ID: PNA, likely aspiration.  H flu in BAL.  completed 14 days abx Plan: Monitor off abx  Heme: DVT in bilateral upper ext. Thrombocytopenia resolved.  Neg HITT panel. Plan: Changec UFH to LMWH  rx coumadin -since cath not being considered .  Endo: Hyperglycemia - very mild. No hx of DM  D/C SSI    Sandrea Hughs, MD Pulmonary and Critical Care Medicine [facility] Cell [phone]

## 2012-01-13 NOTE — Progress Notes (Signed)
ANTICOAGULATION CONSULT NOTE - Follow Up Consult  Pharmacy Consult for Lovenox and Warfarin Indication: Bilateral cephalic vein thromboses  Assessment: [name] being managed on full-dose Lovenox to bridge to therapeutic INR with coumadin for bilateral cephalic vein thromboses. Renal function stable, no bleeding noted in chart. Coumadin was started last night since no plans for Cath. Baseline INR 1.01; after 1 dose last night, INR 1.08 today.   Goal of Therapy:  INR 2-3 Monitor platelets by anticoagulation protocol: Yes   Plan:  1. Continue Lovenox 60mg  Q12 hours 2. Repeat Coumadin 7.5mg  x once tonight 3. Follow-up daily INR and Q72hr CBC  Angel [name], PharmD     Pager [phone] 01/13/2012   10:32 AM  ---------------------   No Known Allergies  Patient Measurements: Height: 6\' 2"  (188 cm) Weight: 127 lb 12.8 oz (57.97 kg) (Scale B) IBW/kg (Calculated) : 82.2   Vital Signs: Temp: 99.2 F (37.3 C) (05/11 0739) Temp src: Oral (05/11 0739) BP: 124/82 mmHg (05/11 0739) Pulse Rate: 89  (05/11 0752)  Labs:  Basename 01/13/12 0820 01/12/12 1618 01/11/12 0430  HGB -- -- 12.8*  HCT -- -- 38.4*  PLT -- -- 772*  APTT -- -- --  LABPROT 14.2 13.5 --  INR 1.08 1.01 --  HEPARINUNFRC -- -- --  CREATININE 0.63 -- --  CKTOTAL -- -- --  CKMB -- -- --  TROPONINI -- -- --    Estimated Creatinine Clearance: 93.6 ml/min (by C-G formula based on Cr of 0.63).

## 2012-01-13 NOTE — Significant Event (Signed)
Patient pulled panda NGT out this am . New panda NGT inserted in rt nare Patient tolerated procedure well.  Port abd xray done . MD order obtained to restart meds and tube feeding.

## 2012-01-14 LAB — CBC
HCT: 42.1 % (ref 39.0–52.0)
MCHC: 34 g/dL (ref 30.0–36.0)
MCV: 95.5 fL (ref 78.0–100.0)
Platelets: 510 10*3/uL — ABNORMAL HIGH (ref 150–400)
RDW: 13.2 % (ref 11.5–15.5)
WBC: 11.1 10*3/uL — ABNORMAL HIGH (ref 4.0–10.5)

## 2012-01-14 LAB — GLUCOSE, CAPILLARY: Glucose-Capillary: 125 mg/dL — ABNORMAL HIGH (ref 70–99)

## 2012-01-14 MED ORDER — WARFARIN SODIUM 10 MG PO TABS
10.0000 mg | ORAL_TABLET | Freq: Once | ORAL | Status: AC
  Administered 2012-01-14: 10 mg via ORAL
  Filled 2012-01-14: qty 1

## 2012-01-14 NOTE — Progress Notes (Signed)
ANTICOAGULATION CONSULT NOTE - Follow Up Consult  Pharmacy Consult for Lovenox and Warfarin Indication: Bilateral cephalic vein thromboses  Assessment: [name] being managed on full-dose Lovenox to bridge to therapeutic INR with coumadin for bilateral cephalic vein thromboses. Renal function stable, Hgb 14.3, no bleeding noted in chart. INR still sub-therapuetic at 1.18, but starting to trend up after 2 doses.   Goal of Therapy:  INR 2-3   Plan:  1. Continue Lovenox 60mg  Q12 hours 2. Coumadin 10 mg x once tonight 3. Follow-up daily PT/INR and Q72hr CBC while on Lovenox  Angel [name], PharmD     Pager [phone] 01/14/2012   10:21 AM  ---------------------   No Known Allergies  Patient Measurements: Height: 6\' 2"  (188 cm) Weight: 126 lb 15.8 oz (57.6 kg) (Scale B.) IBW/kg (Calculated) : 82.2   Vital Signs: Temp: 98.7 F (37.1 C) (05/12 0556) Temp src: Oral (05/12 0556) BP: 116/75 mmHg (05/12 0556) Pulse Rate: 86  (05/12 0742)  Labs:  Basename 01/14/12 0700 01/13/12 0820 01/12/12 1618  HGB 14.3 -- --  HCT 42.1 -- --  PLT 510* -- --  APTT -- -- --  LABPROT 15.2 14.2 13.5  INR 1.18 1.08 1.01  HEPARINUNFRC -- -- --  CREATININE -- 0.63 --  CKTOTAL -- -- --  CKMB -- -- --  TROPONINI -- -- --    Estimated Creatinine Clearance: 93 ml/min (by C-G formula based on Cr of 0.63).

## 2012-01-14 NOTE — Progress Notes (Signed)
PROGRESS NOTE  Subjective:   Angel [name] is a 46 yo male admitted after having a cardiac arrest.  EF originally 20 % but now it has normalized.  Not having any angina.  Awake but not completely oriented.    Panda was replaced yesterday.  Objective:    Vital Signs:   Temp:  [97.8 F (36.6 C)-98.7 F (37.1 C)] 98.7 F (37.1 C) (05/12 0556) Pulse Rate:  [61-115] 86  (05/12 0742) Resp:  [16-20] 18  (05/12 0742) BP: (112-135)/(75-76) 116/75 mmHg (05/12 0556) SpO2:  [95 %-100 %] 98 % (05/12 0742) FiO2 (%):  [28 %] 28 % (05/12 0742) Weight:  [126 lb 15.8 oz (57.6 kg)] 126 lb 15.8 oz (57.6 kg) (05/12 0556)  Last BM Date: 01/12/12   24-hour weight change: Weight change:   Weight trends: Filed Weights   01/12/12 0500 29-Jan-2012 0739 01/14/12 0556  Weight: 128 lb 6.4 oz (58.242 kg) 127 lb 12.8 oz (57.97 kg) 126 lb 15.8 oz (57.6 kg)    Intake/Output:  01-29-2023 0701 - 05/12 0700 In: 200 [NG/GT:200] Out: 1450 [Urine:1450]     Physical Exam: BP 116/75  Pulse 86  Temp(Src) 98.7 F (37.1 C) (Oral)  Resp 18  Ht 6\' 2"  (1.88 m)  Wt 126 lb 15.8 oz (57.6 kg)  BMI 16.30 kg/m2  SpO2 98%  General: Vital signs reviewed and noted. Thin, chronically ill appearing.  Head: Normocephalic, atraumatic.  Eyes: conjunctivae/corneas clear. PERRL, EOM's intact. Fundi benign.  Throat: Trach in place  Neck: Supple. Normal carotids. No JVD  Lungs:  Clear bilaterally to auscultation without wheezes, rales, or rhonchi. Breathing is unlabored.  Heart: Regular rate,  With normal  S1 S2. No murmurs, rubs, or gallops   Abdomen:  Soft, non-tender, non-distended with normoactive bowel sounds. No hepatomegaly. No rebound/guarding. No abdominal masses.  Extremities: No edema.  Distal pedal pulses are 2+ and equal bilaterally.  Neurologic: A&O X3, CN II - XII are grossly intact. Motor strength is 5/5 in the all 4 extremities.  Psych: Speaking better.     Labs: BMET:  Basename 01/29/12 0820  NA 142  K 3.9    CL 102  CO2 27  GLUCOSE 122*  BUN 34*  CREATININE 0.63  CALCIUM 9.5  MG --  PHOS --    Liver function tests: No results found for this basename: AST:2,ALT:2,ALKPHOS:2,BILITOT:2,PROT:2,ALBUMIN:2 in the last 72 hours No results found for this basename: LIPASE:2,AMYLASE:2 in the last 72 hours  CBC:  Basename 01/14/12 0700  WBC 11.1*  NEUTROABS --  HGB 14.3  HCT 42.1  MCV 95.5  PLT 510*    Cardiac Enzymes: No results found for this basename: CKTOTAL:4,CKMB:4,TROPONINI:4 in the last 72 hours  Coagulation Studies:  Basename 01/14/12 0700 29-Jan-2012 0820 01/12/12 1618  LABPROT 15.2 14.2 13.5  INR 1.18 1.08 1.01     Basename 01/12/12 0615  CHOL 114  HDL 43  LDLCALC 57  TRIG 72  CHOLHDL 2.7     Tele:  NSR  Medications:    Infusions:    . sodium chloride Stopped (01/10/12 1400)  . feeding supplement (OSMOLITE 1.2 CAL) 1,000 mL (01-29-12 0047)    Scheduled Medications:    . antiseptic oral rinse  15 mL Mouth Rinse q12n4p  . aspirin  81 mg Oral Daily  . chlorhexidine  15 mL Mouth Rinse BID  . enoxaparin (LOVENOX) injection  1 mg/kg Subcutaneous Q12H  . feeding supplement  30 mL Per Tube BID  . free water  200 mL Per Tube Q8H  . metoprolol tartrate  25 mg Per Tube TID  . sodium chloride  3 mL Intravenous Q12H  . warfarin  7.5 mg Oral ONCE-1800  . warfarin   Does not apply Once  . Warfarin - Pharmacist Dosing Inpatient   Does not apply q1800    Assessment/ Plan:    1. Cardiac arrest:  EF was 20% but now is 60-65%.  No angina.  He is still not recovered completely from a neuro standpoint but has improved.   Will anticipate doing a myoview at some point assuming that he continues to recover.  He will need PT consult most likely.  Karie Soda has been replaced.  Disposition:  Length of Stay: 36  Vesta Mixer, Montez Hageman., MD, [facility] 01/14/2012, 8:49 AM

## 2012-01-14 NOTE — Progress Notes (Signed)
Name: Angel [name] DOB: 02/17/1966 MRN: [id number] PCP: No primary provider on file. ADMIT DATE: 12/24/2011 LOS: 21  PCCM PROGRESS NOTE  Brief Patient Profile:45 YO man with no past medical history. His family states that he abuses some perscription drugs and some occasional alcohol. Smokes 2 PPD history. Daughter tried to call him 4/21 all day but he was sleeping which is unusual for him. His friend found him shaking violently around 8 PM and called 911 who intubated in the field for agonal breathing. He was unresponsive at the time. He was brought to the ED 4/21 and placed on hypothermia protocol. No ischemic EKG changes on ED arrival. UDS positive for cocaine and THC.  He subsequently made a dramatic recovery neurologically but remains trach/ft dep   Lines / Drains: ETT 4/21>>> 5/2 CVC 4/21>>> out Janina Mayo Molli Knock) 5/2>>>  Micro: Blood 4/21>>>HAEMOPHILUS INFLUENZAE Urine 4/21>>>Neg  Sputum 4/21>>>GNR and GPC - NORMAL FLORA BC 4/27 NTD BAL 4/27 NTD  Urine 4/27 NTD BAL 4/30>>>NTD  Abx Vancomycin 4/21>5/1 Primaxin 4/24> 5/7  SUBJ: No distress  BMET    Component Value Date/Time   NA 142 01/13/2012 0820   K 3.9 01/13/2012 0820   CL 102 01/13/2012 0820   CO2 27 01/13/2012 0820   GLUCOSE 122* 01/13/2012 0820   BUN 34* 01/13/2012 0820   CREATININE 0.63 01/13/2012 0820   CALCIUM 9.5 01/13/2012 0820   GFRNONAA >90 01/13/2012 0820   GFRAA >90 01/13/2012 0820   CBC    Component Value Date/Time   WBC 11.1* 01/14/2012 0700   RBC 4.41 01/14/2012 0700   HGB 14.3 01/14/2012 0700   HCT 42.1 01/14/2012 0700   PLT 510* 01/14/2012 0700   MCV 95.5 01/14/2012 0700   MCH 32.4 01/14/2012 0700   MCHC 34.0 01/14/2012 0700   RDW 13.2 01/14/2012 0700   LYMPHSABS 0.8 12/24/2011 2009   MONOABS 0.1 12/24/2011 2009   EOSABS 0.0 12/24/2011 2009   BASOSABS 0.0 12/24/2011 2009    Subjective/Overnight/Interval History: No events overnight. No distress, failed swallow eval 5/9 but wants to eat  Vital  Signs: Temp:  [97.8 F (36.6 C)-98.7 F (37.1 C)] 98.7 F (37.1 C) (05/12 0556) Pulse Rate:  [61-88] 61  (05/12 1336) Resp:  [16-18] 18  (05/12 1336) BP: (112-135)/(75-76) 116/75 mmHg (05/12 0556) SpO2:  [98 %-100 %] 100 % (05/12 1336) FiO2 (%):  [28 %] 28 % (05/12 1336) Weight:  [126 lb 15.8 oz (57.6 kg)] 126 lb 15.8 oz (57.6 kg) (05/12 0556) I/O last 3 completed shifts: In: 725 [NG/GT:725] Out: 2225 [Urine:2225]  Intake/Output Summary (Last 24 hours) at 01/14/12 1411 Last data filed at 01/14/12 0942  Gross per 24 hour  Intake    200 ml  Output   1050 ml  Net   -850 ml   Physical Examination: General: Comfortable on ATC Neuro: Calm, MAEs, appropriate, no focal deficts  HEENT:  WNL Neck: Trach and clean Cardiovascular: RRR s M Chest: Clear to ausculatation Abdomen:  Soft, non tender, NABS Extremities:No edema, no cyanosis, no clubbing.  Ventilator settings: Vent Mode:  [-]  FiO2 (%):  [28 %] 28 %  Labs/Radiology: Please see A and P for full labs  No new CXR  ASSESSMENT AND PLAN 46 year old male with PMH of cocaine, etoh and drug abuse presenting after being found down in a hotel room positive for cocaine and THC. Patient was noted to have vomitus into his airway upon intubation. RUL and RML infiltrate noted. Completed  hypothermia and ARDS protocol.  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 Plan: Cont supportive care  Cardiac: s/p cardiac arrest.  Shock resolved.  Plan: Transitioned cardiac medications to enteral  LV function has normalized. He may only require Myoview imaging at this point in light of recovery of EF  Continue BB given NSTEMI - it will be important for him to abstain from cocaine Further ischemia eval per Cards.  Add baby ASA  Pulmonary: VDRF resolved, still trach and 02 dep -S/P trach - May consider decannulation in future  To remain NPO and cont Panda TFs. Re-eval swallow after decannulation   GI: protein calorie  malnutrition.   Plan: Continue TF.  ID: PNA, likely aspiration.  H flu in BAL.  completed 14 days abx Plan: Monitor off abx  Heme: DVT in bilateral upper ext. Thrombocytopenia resolved.  Neg HITT panel. Plan: Changec UFH to LMWH until INR therapeutic  rx coumadin -since cath not being considered .  Endo: Hyperglycemia - very mild. No hx of DM  D/C SSI    Sandrea Hughs, MD Pulmonary and Critical Care Medicine [facility] Cell [phone]

## 2012-01-15 ENCOUNTER — Inpatient Hospital Stay (HOSPITAL_COMMUNITY): Payer: Medicaid Other

## 2012-01-15 LAB — GLUCOSE, CAPILLARY
Glucose-Capillary: 125 mg/dL — ABNORMAL HIGH (ref 70–99)
Glucose-Capillary: 130 mg/dL — ABNORMAL HIGH (ref 70–99)

## 2012-01-15 LAB — PROTIME-INR
INR: 1.11 (ref 0.00–1.49)
Prothrombin Time: 14.5 seconds (ref 11.6–15.2)

## 2012-01-15 MED ORDER — REGADENOSON 0.4 MG/5ML IV SOLN
0.4000 mg | Freq: Once | INTRAVENOUS | Status: AC
  Administered 2012-01-17: 0.4 mg via INTRAVENOUS
  Filled 2012-01-15: qty 5

## 2012-01-15 MED ORDER — WARFARIN SODIUM 7.5 MG PO TABS
15.0000 mg | ORAL_TABLET | Freq: Once | ORAL | Status: AC
  Administered 2012-01-15: 15 mg via ORAL
  Filled 2012-01-15: qty 2

## 2012-01-15 NOTE — Progress Notes (Signed)
Physical Therapy Treatment Patient Details Name: Angel [name] MRN: [id number] DOB: 1965/09/26 Today's Date: 01/15/2012 Time: 1914-7829 PT Time Calculation (min): 28 min  PT Assessment / Plan / Recommendation Comments on Treatment Session  Pt much improved.  Pt will be added to mobility team and suggested Nursing ambulate pt daily.       Follow Up Recommendations  Supervision/Assistance - 24 hour;No PT follow up    Barriers to Discharge        Equipment Recommendations  None recommended by PT    Recommendations for Other Services    Frequency     Plan      Precautions / Restrictions Precautions Precautions: Fall Precaution Comments: trach collar at 28% Restrictions Weight Bearing Restrictions: No   Pertinent Vitals/Pain No c/o pain.  O2 sats in 90s on trach collar at 28% throughout session.    Mobility  Bed Mobility Bed Mobility: Sit to Supine;Supine to Sit Rolling Right: Not tested (comment) Right Sidelying to Sit: Not tested (comment) Supine to Sit: 6: Modified independent (Device/Increase time);HOB elevated Sitting - Scoot to Edge of Bed: 7: Independent Sit to Supine: 6: Modified independent (Device/Increase time);HOB elevated Details for Bed Mobility Assistance: No assistance required.  Transfers Transfers: Sit to Stand;Stand to Sit Sit to Stand: 7: Independent;From bed Stand to Sit: 7: Independent;To bed Stand Pivot Transfers: Not tested (comment) Ambulation/Gait Ambulation/Gait Assistance: 5: Supervision Ambulation Distance (Feet): 300 Feet Assistive device: None Ambulation/Gait Assistance Details: Pt with O2 trach collar.  Pt ambulated with no AD.  Supervision for safety due to occasional instability but no physical assistance required.  Gait Pattern: Decreased stride length;Narrow base of support Gait velocity: cadence more consistant than prior session.  Stairs: No Wheelchair Mobility Wheelchair Mobility: No    Exercises     PT Diagnosis:    PT  Problem List:   PT Treatment Interventions:     PT Goals Acute Rehab PT Goals PT Goal Formulation: With patient Time For Goal Achievement: 01/22/12 Potential to Achieve Goals: Good Pt will go Supine/Side to Sit: Independently PT Goal: Supine/Side to Sit - Progress: Updated due to goal met Pt will Sit at [facility] of Bed: Independently;1-2 min;with no upper extremity support PT Goal: Sit at Edge Of Bed - Progress: Met Pt will go Sit to Supine/Side: Independently PT Goal: Sit to Supine/Side - Progress: Updated due to goal met Pt will go Sit to Stand: Independently PT Goal: Sit to Stand - Progress: Met Pt will go Stand to Sit: Independently PT Goal: Stand to Sit - Progress: Met Pt will Transfer Bed to Chair/Chair to Bed: with supervision PT Transfer Goal: Bed to Chair/Chair to Bed - Progress: Met Pt will Stand: with supervision;1 - 2 min;with no upper extremity support PT Goal: Stand - Progress: Met Pt will Ambulate: >150 feet;Independently PT Goal: Ambulate - Progress: Updated due to goal met Pt will Perform Home Exercise Program: Independently  Visit Information  Last PT Received On: 01/15/12 Assistance Needed: +1    Subjective Data  Subjective: I havnt been walking since I got in this room.  Patient Stated Goal: Return to work in the long term   Cognition  Overall Cognitive Status: Appears within functional limits for tasks assessed/performed Arousal/Alertness: Awake/alert Orientation Level: Oriented X4 / Intact Behavior During Session: [facility] for tasks performed    Balance  Balance Balance Assessed: Yes Static Sitting Balance Static Sitting - Level of Assistance: Not tested (comment) High Level Balance High Level Balance Activites: Turns;Direction changes;Head turns High Level  Balance Comments: Pt presents with decreased stability when performing horizontal and vertical head turns.   End of Session PT - End of Session Equipment Utilized During Treatment: Gait belt Activity  Tolerance: Patient tolerated treatment well Patient left: in bed;with call bell/phone within reach Nurse Communication: Mobility status    Grazia Taffe 01/15/2012, 12:07 PM Sacheen Arrasmith L. Jquan Egelston DPT [phone]

## 2012-01-15 NOTE — Progress Notes (Signed)
PROGRESS NOTE  Subjective:   Angel [name] is a 46 yo male admitted after having a cardiac arrest.  EF originally 20 % but now it has normalized.  Not having any angina.  Awake but not completely oriented.    Panda was replaced yesterday.  Objective:    Vital Signs:   Temp:  [98.1 F (36.7 C)-98.9 F (37.2 C)] 98.1 F (36.7 C) (05/13 0355) Pulse Rate:  [53-81] 81  (05/13 0355) Resp:  [16-18] 18  (05/13 0355) BP: (116-117)/(64-82) 116/64 mmHg (05/13 0355) SpO2:  [96 %-100 %] 96 % (05/13 0355) FiO2 (%):  [28 %] 28 % (05/13 0355) Weight:  [127 lb 3.3 oz (57.7 kg)] 127 lb 3.3 oz (57.7 kg) (05/13 0355)  Last BM Date: 20-Jan-2012   24-hour weight change: Weight change: -9.5 oz (-0.27 kg)  Weight trends: Filed Weights   01/13/12 0739 01/29/2012 0556 01/15/12 0355  Weight: 127 lb 12.8 oz (57.97 kg) 126 lb 15.8 oz (57.6 kg) 127 lb 3.3 oz (57.7 kg)    Intake/Output:  01-20-23 0701 - 05/13 0700 In: 200 [NG/GT:200] Out: 850 [Urine:850]     Physical Exam: BP 116/64  Pulse 81  Temp(Src) 98.1 F (36.7 C) (Oral)  Resp 18  Ht 6\' 2"  (1.88 m)  Wt 127 lb 3.3 oz (57.7 kg)  BMI 16.33 kg/m2  SpO2 96%  General: Vital signs reviewed and noted. Thin, chronically ill appearing.  Head: Normocephalic, atraumatic.  Eyes: conjunctivae/corneas clear. PERRL, EOM's intact. Fundi benign.  Throat: Trach in place  Neck: Supple. Normal carotids. No JVD  Lungs:  Clear bilaterally to auscultation without wheezes, rales, or rhonchi. Breathing is unlabored.  Heart: Regular rate,  With normal  S1 S2. No murmurs, rubs, or gallops   Abdomen:  Soft, non-tender, non-distended with normoactive bowel sounds. No hepatomegaly. No rebound/guarding. No abdominal masses.  Extremities: No edema.  Distal pedal pulses are 2+ and equal bilaterally.  Neurologic: A&O X3, CN II - XII are grossly intact. Motor strength is 5/5 in the all 4 extremities.  Psych: Speaking better.     Labs: BMET:  Basename 01/13/12 0820  NA  142  K 3.9  CL 102  CO2 27  GLUCOSE 122*  BUN 34*  CREATININE 0.63  CALCIUM 9.5  MG --  PHOS --    Liver function tests: No results found for this basename: AST:2,ALT:2,ALKPHOS:2,BILITOT:2,PROT:2,ALBUMIN:2 in the last 72 hours No results found for this basename: LIPASE:2,AMYLASE:2 in the last 72 hours  CBC:  Basename 01/18/2012 0700  WBC 11.1*  NEUTROABS --  HGB 14.3  HCT 42.1  MCV 95.5  PLT 510*    Cardiac Enzymes: No results found for this basename: CKTOTAL:4,CKMB:4,TROPONINI:4 in the last 72 hours  Coagulation Studies:  Basename 01/15/12 0650 02/02/2012 0700 01/13/12 0820 01/12/12 1618  LABPROT 14.5 15.2 14.2 13.5  INR 1.11 1.18 1.08 1.01    No results found for this basename: CHOL,HDL,LDLCALC,TRIG,CHOLHDL in the last 72 hours   Tele:  NSR  Medications:    Infusions:    . sodium chloride Stopped (01/10/12 1400)  . feeding supplement (OSMOLITE 1.2 CAL) 1,000 mL (Jan 20, 2012 1246)    Scheduled Medications:    . antiseptic oral rinse  15 mL Mouth Rinse q12n4p  . aspirin  81 mg Oral Daily  . chlorhexidine  15 mL Mouth Rinse BID  . enoxaparin (LOVENOX) injection  1 mg/kg Subcutaneous Q12H  . feeding supplement  30 mL Per Tube BID  . free water  200 mL Per  Tube Q8H  . metoprolol tartrate  25 mg Per Tube TID  . sodium chloride  3 mL Intravenous Q12H  . warfarin  10 mg Oral ONCE-1800  . Warfarin - Pharmacist Dosing Inpatient   Does not apply q1800    Assessment/ Plan:    1. Cardiac arrest:  EF was 20% but now is 60-65%.  No angina.  He is still not recovered completely from a neuro standpoint but has improved.   Will schedule him for a [employer].  Karie Soda has been replaced.  Disposition:  Length of Stay: 22  Vesta Mixer, Montez Hageman., MD, [facility] 01/15/2012, 7:43 AM

## 2012-01-15 NOTE — Procedures (Signed)
Objective Swallowing Evaluation: Modified Barium Swallowing Study  Patient Details  Name: Angel [name] MRN: [id number] Date of Birth: 03-Sep-1966  Today's Date: 01/15/2012 Time: 5621-3086 SLP Time Calculation (min): 20 min  Past Medical History:  Past Medical History  Diagnosis Date  . No significant past medical history    Past Surgical History: History reviewed. No pertinent past surgical history. HPI:  46 yr old admitted with cardiac arrest, + cocaine, likely drug overdose.  Intubated 4/21-5/2.  Trach 5/2 (no PEG as previously documented, pt with PANDA will need PEG if dysphagia is severe). Pt tolerating PMSV with full supervision after downsize to 6 cuffless shiley. Bedside swallow eval revealed overt signs of aspiration. objective test needed to determine safety with POs.  (currently tolerating PMSV without difficulty)     Assessment / Plan / Recommendation Clinical Impression  Dysphagia Diagnosis: Mild pharyngeal phase dysphagia Clinical impression: Patient presents with significant overall improvement in swallowing function since previous MBS. He now presents with a mild sensory based pharyngeal dysphagia characterized by delayed swallow initiation resulting in deep silent penetration of thin liquids. Although penetration trace, sensation limited and neither dry swallows nor the use of a chin tuck assists to fully clear or prevent episodes, resulting in eventual silent aspiration. Patient able to fully protect the airway with solids and nectar thick liquid trials. Recommend initiation of po diet with use of aspiration precautions to decrease risk.     Treatment Recommendation  Therapy as outlined in treatment plan below    Diet Recommendation Dysphagia 3 (Mechanical Soft);Nectar-thick liquid      Follow Up Recommendations   (to be determined)    Frequency and Duration min 2x/week  2 weeks   Pertinent Vitals/Pain n/a    SLP Swallow Goals Patient will consume recommended  diet without observed clinical signs of aspiration with: Supervision/safety Swallow Study Goal #1 - Progress: Not Met Patient will utilize recommended strategies during swallow to increase swallowing safety with: Supervision/safety Swallow Study Goal #2 - Progress: Not met   General HPI: 46 yr old admitted with cardiac arrest, + cocaine, likely drug overdose.  Intubated 4/21-5/2.  Trach 5/2 (no PEG as previously documented, pt with PANDA will need PEG if dysphagia is severe). Pt tolerating PMSV with full supervision after downsize to 6 cuffless shiley. Bedside swallow eval revealed overt signs of aspiration. objective test needed to determine safety with POs.  (currently tolerating PMSV without difficulty) Type of Study: Modified Barium Swallowing Study Previous Swallow Assessment: MBS 5/9-NPO, trials of puree and honey thick liquid with SLP only due to severe dysphagia and AMS Diet Prior to this Study: NPO;Panda Temperature Spikes Noted: No Respiratory Status: Trach (28% FiO2 via trach collar) Trach Size and Type: #6;Uncuffed;With PMSV in place History of Intubation: Yes Length of Intubations (days): 11 days Date extubated: 01/04/12 Behavior/Cognition: Alert;Cooperative;Pleasant mood Oral Cavity - Dentition: Adequate natural dentition Oral Motor / Sensory Function: Within functional limits Vision: Functional for self-feeding Patient Positioning: Upright in chair Baseline Vocal Quality: Clear Volitional Cough: Strong Volitional Swallow: Able to elicit Anatomy: Within functional limits Pharyngeal Secretions: Not observed secondary MBS          Angel Lango MA, CCC-SLP [phone]        Angel [name] 01/15/2012, 3:03 PM

## 2012-01-15 NOTE — Progress Notes (Addendum)
Clinical Social Worker spoke with patient's mother over the phone. Mother wants patient to go to a "rest home" because she states the he does not have anybody who can watch him for 24hrs supervision. Patient has been working with PT and has been progressing to needing supervision with no PT followup. Due to patient progressing with ADL's, SNF placement is not appropriate. Patient does not have insurance and is medicaid pending. Per chart review, decannulation will be attempted on 5/15. Patient's likely discharge plan will be back to friend's home. CSW will continue to follow for discharge planning needs.    Rozetta Nunnery MSW, [employer] [phone]

## 2012-01-15 NOTE — Consult Note (Signed)
ANTICOAGULATION CONSULT NOTE - Follow Up Consult  Pharmacy Consult for : Lovenox and Coumadin  Indication: Bilateral cephalic vein thromboses  No Known Allergies  Patient Measurements: Height: 6\' 2"  (188 cm) Weight: 127 lb 3.3 oz (57.7 kg) (Scale B.) IBW/kg (Calculated) : 82.2    Vital Signs: Temp: 98.2 F (36.8 C) (05/13 1349) Temp src: Oral (05/13 1349) BP: 113/71 mmHg (05/13 1349) Pulse Rate: 72  (05/13 1349)  Labs:  Basename 01/15/12 0650 01/14/12 0700 01/13/12 0820  HGB -- 14.3 --  HCT -- 42.1 --  PLT -- 510* --  APTT -- -- --  LABPROT 14.5 15.2 14.2  INR 1.11 1.18 1.08  HEPARINUNFRC -- -- --  CREATININE -- -- 0.63  CKTOTAL -- -- --  CKMB -- -- --  TROPONINI -- -- --   Estimated Creatinine Clearance: 93.2 ml/min (by C-G formula based on Cr of 0.63).   Medications:  Scheduled:    . antiseptic oral rinse  15 mL Mouth Rinse q12n4p  . aspirin  81 mg Oral Daily  . chlorhexidine  15 mL Mouth Rinse BID  . enoxaparin (LOVENOX) injection  1 mg/kg Subcutaneous Q12H  . feeding supplement  30 mL Per Tube BID  . free water  200 mL Per Tube Q8H  . metoprolol tartrate  25 mg Per Tube TID  . regadenoson  0.4 mg Intravenous Once  . sodium chloride  3 mL Intravenous Q12H  . warfarin  10 mg Oral ONCE-1800  . Warfarin - Pharmacist Dosing Inpatient   Does not apply q1800   Infusions:    . sodium chloride Stopped (01/10/12 1400)  . feeding supplement (OSMOLITE 1.2 CAL) 1,000 mL (01/14/12 1246)   Anti-infectives     Start     Dose/Rate Route Frequency Ordered Stop   01/01/12 1200   imipenem-cilastatin (PRIMAXIN) 500 mg in sodium chloride 0.9 % 100 mL IVPB        500 mg 200 mL/hr over 30 Minutes Intravenous 4 times per day 01/01/12 0943 01/10/12 1004   12/31/11 1400   imipenem-cilastatin (PRIMAXIN) 500 mg in sodium chloride 0.9 % 100 mL IVPB  Status:  Discontinued        500 mg 200 mL/hr over 30 Minutes Intravenous 3 times per day 12/31/11 1121 01/01/12 0943   12/31/11 1300   vancomycin (VANCOCIN) 1,250 mg in sodium chloride 0.9 % 250 mL IVPB  Status:  Discontinued        1,250 mg 166.7 mL/hr over 90 Minutes Intravenous Every 12 hours 12/31/11 1120 01/03/12 1105   12/25/11 0600   piperacillin-tazobactam (ZOSYN) IVPB 3.375 g  Status:  Discontinued        3.375 g 12.5 mL/hr over 240 Minutes Intravenous 3 times per day 12/24/11 2257 12/31/11 1028   12/24/11 2359   vancomycin (VANCOCIN) IVPB 1000 mg/200 mL premix  Status:  Discontinued        1,000 mg 200 mL/hr over 60 Minutes Intravenous Daily at bedtime 12/24/11 2301 12/29/11 1009   12/24/11 2045  piperacillin-tazobactam (ZOSYN) 3.375 g in dextrose 5 % 50 mL IVPB       3.375 g 100 mL/hr over 30 Minutes Intravenous To [facility] 12/24/11 2011 12/24/11 2206   12/24/11 2015   vancomycin (VANCOCIN) IVPB 1000 mg/200 mL premix  Status:  Discontinued        1,000 mg 200 mL/hr over 60 Minutes Intravenous  Once 12/24/11 2011 12/24/11 2301          Assessment:  [name] being managed on full-dose Lovenox to bridge to therapeutic INR with coumadin for bilateral cephalic vein thromboses.  INR SUBtherapeutic with little change from baseline  Continuing Lovenox bridging.  No bleeding noted.  Last Hgb 14.3, Plt 510.  Goal of Therapy:   INR 2-3  Full Dose Lovenox until INR therapeutic.    Plan:   Continue Lovenox 60 mg sq q 12 hours.  Coumadin 15 mg po today. Daily INR's, CBC.  Viraj Liby, Elisha Headland, Pharm.D. 01/15/2012 3:12 PM

## 2012-01-15 NOTE — Progress Notes (Signed)
Speech Language Pathology Dysphagia and PMSV Treatment Patient Details Name: Angel [name] MRN: [id number] DOB: February 15, 1966 Today's Date: 01/15/2012 Time: 0940-1000 SLP Time Calculation (min): 20 min  Assessment / Plan / Recommendation Clinical Impression  Treatment focused on po trials to assess for readiness for repeat MBS as well as PMSV tolerance. Patient able to wear PMSV for 20 minutes without flucutations in vital signs, indication of distress, increased WOB, or CO2 retention. Vocal quality clear and strong. Po trials of puree with honey thick liquid revealed no overt s/s of aspiration and what appears to be timely swallow response. Patient able to self feed with use of effortful swallow and multiple dry swallows with initial verbal cue, then supervision from clinician. Patient verbalized strong desire to resume pos. At this time, overt clinical indicators of aspiration have decreased. Plan for repeat MBS this pm to determine ability to initiate a po diet. PMSV left in place following treatment as patient appears to be tolerating well.     Diet Recommendation  Continue with Current Diet: NPO    SLP Plan MBS;Other (Comment) (new goals TBD based on MBS results. )   Pertinent Vitals/Pain Stable, patient with c/o mild headache which RN was made aware of prior to treatment.    Swallowing Goals  SLP Swallowing Goals Goal #3: Pt will consume trials of puree and honey thick liquids via teaspoon with SLP only with adequate swallow response and intermittent cued swallow with moderate verbal cues.  Swallow Study Goal #3 - Progress: Met  General Temperature Spikes Noted: No Respiratory Status: Trach (28% FiO2 via trach collar) Behavior/Cognition: Alert;Cooperative;Distractible;Pleasant mood Oral Cavity - Dentition: Adequate natural dentition Patient Positioning: Upright in bed  Oral Cavity - Oral Hygiene     Dysphagia Treatment Treatment focused on: Upgraded PO texture  trials;Patient/family/caregiver education;Facilitation of oral phase;Facilitation of pharyngeal phase;Utilization of compensatory strategies;Other (comment) (PMSV tolerance) Treatment Methods/Modalities: Skilled observation;Differential diagnosis;Effortful swallow Patient observed directly with PO's: Yes Type of PO's observed: Dysphagia 1 (puree);Honey-thick liquids Feeding: Able to feed self Liquids provided via: Teaspoon Type of cueing: Verbal Amount of cueing: Minimal   [name] MA, CCC-SLP [phone]   Angel [name] Angel [name] 01/15/2012, 10:14 AM

## 2012-01-15 NOTE — Progress Notes (Signed)
Pt. Did not want to be suctioned.

## 2012-01-15 NOTE — Progress Notes (Signed)
Name: Angel [name] DOB: 08/21/1966 MRN: [id number] PCP: No primary provider on file. 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 His family states that he abuses some perscription drugs and some occasional alcohol. Smokes 2 PPD history. Daughter tried to call him 4/21 all day but he was sleeping which is unusual for him. His friend found him shaking violently around 8 PM and called 911 who intubated in the field for agonal breathing. He was unresponsive at the time. He was brought to the ED 4/21 and placed on hypothermia protocol. No ischemic EKG changes on ED arrival. UDS positive for cocaine and THC.  He subsequently made a dramatic recovery neurologically but remains trach/ft dep.   Lines / Drains: ETT 4/21>>> 5/2 CVC 4/21>>> out Janina Mayo Molli Knock) 5/2>>>  Micro: Blood 4/21>>>HAEMOPHILUS INFLUENZAE Urine 4/21>>>Neg  Sputum 4/21>>>GNR and GPC - NORMAL FLORA BC 4/27 NTD BAL 4/27 NTD  Urine 4/27 NTD BAL 4/30>>>NTD  Abx Vancomycin 4/21>5/1 Primaxin 4/24> 5/7  SUBJ: feels great, wants to eat  Vital Signs: Temp:  [98.1 F (36.7 C)-98.9 F (37.2 C)] 98.1 F (36.7 C) (05/13 0355) Pulse Rate:  [53-88] 88  (05/13 0800) Resp:  [16-99] 99  (05/13 0800) BP: (116-117)/(64-82) 116/64 mmHg (05/13 0355) SpO2:  [96 %-100 %] 99 % (05/13 0800) FiO2 (%):  [28 %] 28 % (05/13 0355) Weight:  [57.7 kg (127 lb 3.3 oz)] 57.7 kg (127 lb 3.3 oz) (05/13 0355) I/O last 3 completed shifts: In: 400 [NG/GT:400] Out:  1700 [Urine:1700]  Intake/Output Summary (Last 24 hours) at 01/15/12 0957 Last data filed at 01/15/12 0914  Gross per 24 hour  Intake    200 ml  Output    650 ml  Net   -450 ml   Physical Examination:  Gen: sitting up in chair, trach uncapped but speaking clearly, rare cough PULM: Insp crackles R base CV: RRR, no mgr AB: BS+, soft, nontender Ext: warm, no edema Neuro:A&Ox4, maew  Labs/Radiology:  CBC    Component Value Date/Time   WBC 11.1* 01/14/2012 0700   RBC 4.41 01/14/2012 0700   HGB 14.3 01/14/2012 0700   HCT 42.1 01/14/2012 0700   PLT 510* 01/14/2012 0700   MCV 95.5 01/14/2012 0700   MCH 32.4 01/14/2012 0700   MCHC 34.0 01/14/2012 0700   RDW 13.2 01/14/2012 0700   LYMPHSABS 0.8 12/24/2011 2009   MONOABS 0.1 12/24/2011 2009   EOSABS 0.0 12/24/2011 2009   BASOSABS 0.0 12/24/2011 2009    BMET    Component Value Date/Time   NA 142 01/13/2012 0820   K 3.9 01/13/2012 0820   CL 102 01/13/2012 0820   CO2 27 01/13/2012 0820   GLUCOSE 122* 01/13/2012 0820   BUN 34* 01/13/2012 0820   CREATININE 0.63 01/13/2012 0820   CALCIUM 9.5 01/13/2012 0820   GFRNONAA >90 01/13/2012 0820   GFRAA >90 01/13/2012 0820     ASSESSMENT AND PLAN 46 year old male with PMH of cocaine, etoh and drug abuse presenting after being found down in a hotel room positive for cocaine and THC. Patient was noted to have vomitus into his airway upon intubation. RUL and RML infiltrate noted. Completed  hypothermia and ARDS protocol and now doing dramatically better.  Neuro: Post anoxic encephalopathy - much, much improved; Plan: Cont supportive care, PT/OT consult  Cardiac: s/p cardiac arrest.  Shock resolved.  Plan: Transitioned cardiac medications to enteral  LV function has normalized. He may only require Myoview imaging at this point in light of recovery of EF  Continue BB given NSTEMI - it will be important for him to abstain from cocaine Further ischemia eval per Cards.  Add baby ASA  Pulmonary: VDRF  resolved, now clearly secretions without difficulty, speaking well; essentially on room air (trach collar off most of the time) Plan: -MBS today -CXR today to f/u infiltrates -CBC in Am -if CBC, CXR, and MBS OK, will cap trach 5/14 and likely decannulate 5/15   GI: protein calorie malnutrition.   Plan: Continue TF.  ID: PNA, likely aspiration.  H flu in BAL.  completed 14 days abx Plan: Monitor off abx  Heme: DVT in bilateral upper ext. Thrombocytopenia resolved.  Neg HITT panel. Plan: Changec UFH to LMWH until INR therapeutic  rx coumadin -since cath not being considered   F/U pharm recs today  Endo: Hyperglycemia - very mild. No hx of DM  D/C SSI  Dispo: MBS today, hopefully decannulate 5/15, home 5/17?  Yolonda Kida PCCM Pager: [phone] Cell: [phone] If no response, call [phone]

## 2012-01-16 ENCOUNTER — Other Ambulatory Visit (HOSPITAL_COMMUNITY): Payer: Self-pay

## 2012-01-16 ENCOUNTER — Inpatient Hospital Stay (HOSPITAL_COMMUNITY): Payer: Medicaid Other

## 2012-01-16 DIAGNOSIS — I82622 Acute embolism and thrombosis of deep veins of left upper extremity: Secondary | ICD-10-CM

## 2012-01-16 DIAGNOSIS — R1310 Dysphagia, unspecified: Secondary | ICD-10-CM

## 2012-01-16 LAB — CBC
HCT: 38.2 % — ABNORMAL LOW (ref 39.0–52.0)
Hemoglobin: 12.9 g/dL — ABNORMAL LOW (ref 13.0–17.0)
MCH: 32 pg (ref 26.0–34.0)
MCHC: 33.8 g/dL (ref 30.0–36.0)
MCV: 94.8 fL (ref 78.0–100.0)

## 2012-01-16 LAB — GLUCOSE, CAPILLARY: Glucose-Capillary: 117 mg/dL — ABNORMAL HIGH (ref 70–99)

## 2012-01-16 LAB — BASIC METABOLIC PANEL
BUN: 34 mg/dL — ABNORMAL HIGH (ref 6–23)
Calcium: 8.9 mg/dL (ref 8.4–10.5)
GFR calc non Af Amer: >90 mL/min (ref >90)
Glucose, Bld: 103 mg/dL — ABNORMAL HIGH (ref 70–99)

## 2012-01-16 MED ORDER — WARFARIN SODIUM 7.5 MG PO TABS
15.0000 mg | ORAL_TABLET | Freq: Once | ORAL | Status: AC
  Administered 2012-01-16: 15 mg via ORAL
  Filled 2012-01-16: qty 2

## 2012-01-16 NOTE — Progress Notes (Addendum)
Clinical Social Worker and [employer], Kim, met with patient to discuss discharge plans. Patient plans to go home with his daughter once he is medically stable. Patient discussed his substance use and stated that he does not use methadone on a regular basis. He stated that his daughter "will not put up with that crap" as he is living with her. CSW offered patient resources for ADS services and he stated that he has heard of the facility. Patient did not feel that he has a problem with methadone. Patient may benefit from home health services of a SW and RN. CSW will sign off as social work intervention is no longer needed.   01/17/12 CSW spoke with patient regarding pending consult for substance abuse. CSW completed SBIRT. CSW will sign off.   Rozetta Nunnery MSW, [employer] [phone]

## 2012-01-16 NOTE — Progress Notes (Signed)
Received word from nursing staff that patient has been receiving tube feeds ongoing so has not been NPO for nuc today. Will have to hold off on stress test for now - will discuss with rounding MD. Ronie Spies PA-C

## 2012-01-16 NOTE — Progress Notes (Signed)
Name: Angel [name] DOB: 09/12/1965 MRN: [id number] PCP: No primary provider on file. 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 His family states that he abuses some perscription drugs and some occasional alcohol. Smokes 2 PPD history. Daughter tried to call him 4/21 all day but he was sleeping which is unusual for him. His friend found him shaking violently around 8 PM and called 911 who intubated in the field for agonal breathing. He was unresponsive at the time. He was brought to the ED 4/21 and placed on hypothermia protocol. No ischemic EKG changes on ED arrival. UDS positive for cocaine and THC.  He subsequently made a dramatic recovery neurologically but remains trach/ft dep.   Lines / Drains: ETT 4/21>>> 5/2 CVC 4/21>>> out Janina Mayo Molli Knock) 5/2>>>  Micro: Blood 4/21>>>HAEMOPHILUS INFLUENZAE Urine 4/21>>>Neg  Sputum 4/21>>>GNR and GPC - NORMAL FLORA BC 4/27>>>neg BAL 4/27>>>neg Urine 4/27>>>neg BAL 4/30>>>neg  Abx Vancomycin 4/21>5/1 Primaxin 4/24> 5/7  SUBJ: "I want to eat everything"  No acute events.    Vital Signs: Temp:  [98.2 F (36.8 C)] 98.2 F (36.8 C) (05/14 0511) Pulse Rate:  [72-108] 99  (05/14 0843) Resp:  [16-24] 17  (05/14 0843) BP: (108-113)/(71-73) 113/73 mmHg (05/14 0511) SpO2:  [95 %-100 %] 98 % (05/14 0843) FiO2 (%):  [28 %] 28 % (05/14 0843) Weight:  [129 lb 3 oz (58.6 kg)] 129 lb 3 oz (58.6 kg) (05/14 0511) I/O last 3 completed shifts: In: 1786  [P.O.:360; I.V.:246; NG/GT:1180] Out: 2076 [Urine:2075; Stool:1]  Intake/Output Summary (Last 24 hours) at 01/16/12 1040 Last data filed at 01/16/12 0654  Gross per 24 hour  Intake   1583 ml  Output   1626 ml  Net    -43 ml   Physical Examination:  Gen: sitting up in chair, trach uncapped but speaking clearly, rare cough PULM: respirations even/non-labored, lungs bilaterally essentially clear, #6.0 cuffless trach midline c/d/i CV: RRR, no mgr AB: BS+, soft, nontender Ext: warm, no edema Neuro:A&Ox4, maew  Labs/Radiology:  CBC  Lab 01/16/12 0500 01/14/12 0700 01/11/12 0430  HGB 12.9* 14.3 12.8*  HCT 38.2* 42.1 38.4*  WBC 10.2 11.1* 11.4*  PLT 495* 510* 772*     BMET  Lab 01/16/12 0500 01/13/12 0820 01/10/12 0405  NA 137 142 145  K 4.0 3.9 --  CL 100 102 109  CO2 27 27 26   GLUCOSE 103* 122* 108*  BUN 34* 34* 39*  CREATININE 0.62 0.63 0.68  CALCIUM 8.9 9.5 9.6  MG -- -- --  PHOS -- -- --      ASSESSMENT AND PLAN 46 year old male with PMH of cocaine, etoh and drug abuse presenting after being found down in a hotel room positive for cocaine and THC. Patient was noted to have vomitus into his airway upon intubation. RUL and RML infiltrate noted. Completed  hypothermia and ARDS protocol and now doing dramatically better.  Neuro:  Post anoxic encephalopathy - Resolved.  Plan:  -Cont supportive care -PT/OT consult  Cardiac:  s/p cardiac arrest.  Shock resolved.  Plan:  -LV function has normalized. He may only require Myoview imaging at this point in  light of recovery of EF.  Continue BB given NSTEMI - it will be important for him to abstain from cocaine -Further ischemia eval per Cards. -Contine baby ASA -was planned for [facility] 5/14 but had been receiving TF overnight.  ? If will reschedule for 5/15  Pulmonary:  VDRF resolved, now clearing secretions without difficulty, speaking well; essentially on room air (trach collar off most of the time) Plan: -MBS  5/13 with recommendation to initiate PO diet -CXR am to f/u infiltrates -CBC in Am -d/c O2 -trial of capped trach 5/14, consideration for decannulation in next 24-48 hours   GI:  Protein calorie malnutrition Dysphagia.   Plan:  -discontinue TF -MBS 5/13, cleared for D3 diet with nectar thick liquid -calorie count 5/14, if tolerates diet, consider removal of Panda 5/15 -allow to eat 5/14 if no plan for Myoview-->will f/u cardiology plan  ID:  PNA, likely aspiration.  H flu in BAL.  Completed 14 days abx Plan:  -Monitor off abx  Heme:  DVT in bilateral upper ext. Thrombocytopenia resolved.  Neg HITT panel. Plan:  -rx coumadin -since cath not being considered  -continue lovenox bridge with coumadin as INR subtherapeutic  Endo:  Hyperglycemia - very mild. No hx of DM Plan:  -Resolved.    Dispo: -Trial cap of trach 5/14, hopefully decannulate 5/15, home 5/17?     Canary Brim, NP-C [facility] Pgr: [phone]  I have seen and examined the patient with nurse practitioner/resident and agree with the note above.   Yolonda Kida PCCM Pager: [phone] Cell: [phone] If no response, call [phone]

## 2012-01-16 NOTE — Consult Note (Signed)
ANTICOAGULATION CONSULT NOTE - Follow Up Consult  Pharmacy Consult for Coumadin with Lovenox bridging Indication: Bilateral cephalic vein thromboses  No Known Allergies  Patient Measurements: Height: 6\' 2"  (188 cm) Weight: 129 lb 3 oz (58.6 kg) (scale (B)) IBW/kg (Calculated) : 82.2    Vital Signs: Temp: 98.2 F (36.8 C) (05/14 0511) Temp src: Oral (05/14 0511) BP: 113/73 mmHg (05/14 0511) Pulse Rate: 83  (05/14 1213)  Labs:  Basename 01/16/12 0500 01/15/12 0650 01/14/12 0700  HGB 12.9* -- 14.3  HCT 38.2* -- 42.1  PLT 495* -- 510*  APTT -- -- --  LABPROT 17.6* 14.5 15.2  INR 1.42 1.11 1.18  HEPARINUNFRC -- -- --  CREATININE 0.62 -- --  CKTOTAL -- -- --  CKMB -- -- --  TROPONINI -- -- --   Estimated Creatinine Clearance: 94.6 ml/min (by C-G formula based on Cr of 0.62).   Medications:  Scheduled:    . antiseptic oral rinse  15 mL Mouth Rinse q12n4p  . aspirin  81 mg Oral Daily  . chlorhexidine  15 mL Mouth Rinse BID  . enoxaparin (LOVENOX) injection  1 mg/kg Subcutaneous Q12H  . feeding supplement  30 mL Per Tube BID  . free water  200 mL Per Tube Q8H  . metoprolol tartrate  25 mg Per Tube TID  . regadenoson  0.4 mg Intravenous Once  . sodium chloride  3 mL Intravenous Q12H  . warfarin  15 mg Oral ONCE-1800  . Warfarin - Pharmacist Dosing Inpatient   Does not apply q1800    Assessment:  Angel [name] being managed on full-dose Lovenox to bridge to therapeutic INR with coumadin for bilateral cephalic vein thromboses.  INR SUBtherapeutic 1.42    Continuing Lovenox bridging. No bleeding noted. Last Hgb 12.9, Plts 495   Goal of Therapy:   Full dose Lovenox bridging until INR therapeutic.  INR 2-3   Plan:   Continue Lovenox  Coumadin 15 mg today.  Citlally Captain, Elisha Headland, Pharm.D. 01/16/2012 12:45 PM

## 2012-01-16 NOTE — Progress Notes (Signed)
Pt had tube feedings going this AM when he was scheduled for his myoview.  It appears that he is being started on food by mouth.  Have rescheduled his myoview for tomorrow.   Will keep him NPO after midnight tonight.  Vesta Mixer, Montez Hageman., MD, [facility] 01/16/2012, 3:54 PM

## 2012-01-16 NOTE — Progress Notes (Signed)
Speech Language Pathology Dysphagia Treatment Patient Details Name: Angel [name] MRN: [id number] DOB: 03/06/1966 Today's Date: 01/16/2012 Time: 1430-1450 SLP Time Calculation (min): 20 min  Assessment / Plan / Recommendation Clinical Impression  Patient able to follow SLP initial verbal cueing for self-thickening of liquids to nectar thick with min verbal cues. Able to consume dysphagia 3 solids and nectar thick liquids with min verbal cueing for small single sips of liquids to decrease aspiration risks. Intermittent dry cough noted during po intake however did not appear directly related to swallowing function. Will continue to f/u at bedside. PMSV capped with red cap today and patient appears to be tolerating well with stong and clear phonation.     Diet Recommendation  Continue with Current Diet: Dysphagia 3 (mechanical soft);Nectar-thick liquid    SLP Plan Continue with current plan of care   Pertinent Vitals/Pain n/a   Swallowing Goals  SLP Swallowing Goals Patient will consume recommended diet without observed clinical signs of aspiration with: Supervision/safety Swallow Study Goal #1 - Progress: Progressing toward goal Patient will utilize recommended strategies during swallow to increase swallowing safety with: Supervision/safety Swallow Study Goal #2 - Progress: Progressing toward goal  General Temperature Spikes Noted: No Respiratory Status: Room air Behavior/Cognition: Alert;Cooperative;Pleasant mood Oral Cavity - Dentition: Adequate natural dentition Patient Positioning: Upright in bed  Dysphagia Treatment Treatment focused on: Skilled observation of diet tolerance;Patient/family/caregiver education Treatment Methods/Modalities: Skilled observation Patient observed directly with PO's: Yes Type of PO's observed: Dysphagia 3 (soft);Nectar-thick liquids Feeding: Able to feed self Liquids provided via: Cup Type of cueing: Verbal Amount of cueing: Minimal   Angel Lango  MA, CCC-SLP [phone]  Angel [name] 01/16/2012, 3:32 PM

## 2012-01-16 NOTE — Progress Notes (Signed)
Patient's trach capped. Continous pulse ox by bedside HR-83 96% on RA. Tolerating well at this time

## 2012-01-16 NOTE — Progress Notes (Addendum)
Nutrition Follow-up New calorie count started for 48 hours, pt was started on NPO diet orders today for a stress test tomorrow. RD will follow and assess calorie count after stress test has been performed.  Diet Order:  NPO Previously diet had been advanced to D3 with nectar thick liquids per SLP after MBS. RD will watch diet advance and address supplement needs and appropriateness of d/c TF after calorie count.   Pt changed from Oxepa to TF of Osmolite 1.2 at a rate of 65 ml/hr and Pro-stat BID via tube. This is providing 2072 kcal and 106 gm protein.    Meds: Scheduled Meds:   . antiseptic oral rinse  15 mL Mouth Rinse q12n4p  . aspirin  81 mg Oral Daily  . chlorhexidine  15 mL Mouth Rinse BID  . enoxaparin (LOVENOX) injection  1 mg/kg Subcutaneous Q12H  . feeding supplement  30 mL Per Tube BID  . free water  200 mL Per Tube Q8H  . metoprolol tartrate  25 mg Per Tube TID  . regadenoson  0.4 mg Intravenous Once  . sodium chloride  3 mL Intravenous Q12H  . warfarin  15 mg Oral ONCE-1800  . Warfarin - Pharmacist Dosing Inpatient   Does not apply q1800   Continuous Infusions:   . sodium chloride Stopped (01/10/12 1400)  . feeding supplement (OSMOLITE 1.2 CAL) 1,000 mL (01/15/12 2157)   PRN Meds:.sodium chloride, acetaminophen (TYLENOL) oral liquid 160 mg/5 mL, fentaNYL, food thickener, ondansetron, sodium chloride, DISCONTD: sodium chloride  Labs:  CMP     Component Value Date/Time   NA 137 01/16/2012 0500   K 4.0 01/16/2012 0500   CL 100 01/16/2012 0500   CO2 27 01/16/2012 0500   GLUCOSE 103* 01/16/2012 0500   BUN 34* 01/16/2012 0500   CREATININE 0.62 01/16/2012 0500   CALCIUM 8.9 01/16/2012 0500   PROT 5.8* 01/02/2012 0430   ALBUMIN 2.0* 01/02/2012 0430   AST 34 01/02/2012 0430   ALT 75* 01/02/2012 0430   ALKPHOS 186* 01/02/2012 0430   BILITOT 0.5 01/02/2012 0430   GFRNONAA >90 01/16/2012 0500   GFRAA >90 01/16/2012 0500     Intake/Output Summary (Last 24 hours) at 01/16/12  1148 Last data filed at 01/16/12 1100  Gross per 24 hour  Intake   1783 ml  Output   1626 ml  Net    157 ml    Weight Status:  129 lbs, weight has been stable for the past 5 days, overall down 25 lbs from admission weight.   Re-estimated needs:  2000-2200 kcal, 90-110 gm protein.  Nutrition Dx:  Inadequate oral intake, ongoing  Goal:  TF to meet >90% of estimated nutrition need, currently unmet d/t being on hold for stress test  Intervention:   1. RD will follow calorie count   Monitor:  Calorie count, TF, weight, labs, I/O's   Rudean Haskell Pager #:  [phone]   Addendum: pt is not NPO until midnight tonight. Spoke with RN, will start calorie count at lunch meal today and continue for 48 hours. RN will place envelope on door and begin recording meal completion on meal tickets at next meal (lunch).

## 2012-01-16 NOTE — Progress Notes (Signed)
Nursing Note: Pt's trach capped by respiratory therapist per MD's order.. Pt is stable with no signs of distress. Will continue to monitor pt. Peni Rupard Scientist, clinical (histocompatibility and immunogenetics).

## 2012-01-16 NOTE — Progress Notes (Signed)
Nursing Note: Pt to be NPO after midnight for stress test planned for 01/16/12. However, pt's tube feeding infusing at this time. MD made aware and stated stress test to be done on 01/17/12 instead. Pt made aware and verbalizes understanding.  Will continue to monitor pt. Gerald Honea Scientist, clinical (histocompatibility and immunogenetics).

## 2012-01-17 ENCOUNTER — Inpatient Hospital Stay (HOSPITAL_COMMUNITY): Payer: Medicaid Other

## 2012-01-17 DIAGNOSIS — I2589 Other forms of chronic ischemic heart disease: Secondary | ICD-10-CM

## 2012-01-17 LAB — BASIC METABOLIC PANEL
BUN: 25 mg/dL — ABNORMAL HIGH (ref 6–23)
Chloride: 101 mEq/L (ref 96–112)
GFR calc Af Amer: >90 mL/min (ref >90)
Potassium: 4 mEq/L (ref 3.5–5.1)
Sodium: 140 mEq/L (ref 135–145)

## 2012-01-17 LAB — CBC
Hemoglobin: 13.3 g/dL (ref 13.0–17.0)
MCH: 32 pg (ref 26.0–34.0)
MCV: 94.7 fL (ref 78.0–100.0)
RBC: 4.15 MIL/uL — ABNORMAL LOW (ref 4.22–5.81)
WBC: 15 10*3/uL — ABNORMAL HIGH (ref 4.0–10.5)

## 2012-01-17 LAB — PROTIME-INR: Prothrombin Time: 24.9 seconds — ABNORMAL HIGH (ref 11.6–15.2)

## 2012-01-17 LAB — GLUCOSE, CAPILLARY

## 2012-01-17 MED ORDER — WARFARIN SODIUM 10 MG PO TABS
10.0000 mg | ORAL_TABLET | Freq: Once | ORAL | Status: DC
  Filled 2012-01-17: qty 1

## 2012-01-17 MED ORDER — TECHNETIUM TC 99M TETROFOSMIN IV KIT
30.0000 | PACK | Freq: Once | INTRAVENOUS | Status: AC | PRN
  Administered 2012-01-17: 30 via INTRAVENOUS

## 2012-01-17 MED ORDER — TECHNETIUM TC 99M TETROFOSMIN IV KIT
10.0000 | PACK | Freq: Once | INTRAVENOUS | Status: AC | PRN
  Administered 2012-01-17: 10 via INTRAVENOUS

## 2012-01-17 NOTE — Progress Notes (Addendum)
Physical Therapy Treatment and discharge Patient Details Name: Angel [name] MRN: [id number] DOB: 08-Sep-1965 Today's Date: 01/17/2012 Time: 4098-1191 PT Time Calculation (min): 13 min  PT Assessment / Plan / Recommendation Comments on Treatment Session  Pt is independent with all mobility and presents with no further acute or follow-up PT needs.     Follow Up Recommendations  No PT follow up    Barriers to Discharge        Equipment Recommendations  None recommended by PT    Recommendations for Other Services    Frequency     Plan Discharge plan needs to be updated;Frequency remains appropriate    Precautions / Restrictions Precautions Precautions: Fall Restrictions Weight Bearing Restrictions: No   Pertinent Vitals/Pain Pt denied having any pain. SpO2 greater than 90 throughout session.      Mobility  Bed Mobility Bed Mobility: Not assessed Transfers Transfers: Sit to Stand;Stand to Sit Sit to Stand: 7: Independent;From bed Stand to Sit: 7: Independent;To bed Stand Pivot Transfers: 7: Independent Ambulation/Gait Ambulation/Gait Assistance: 7: Independent Ambulation Distance (Feet): 400 Feet Assistive device: None Ambulation/Gait Assistance Details: Pt ambulating on Room air with O2 sats greater than 90 throughout session.  Gait Pattern: Within Functional Limits Stairs: No Wheelchair Mobility Wheelchair Mobility: No    Exercises General Exercises - Lower Extremity Long Arc Quad: AROM;Both;5 reps;Seated Hip Flexion/Marching: AROM;Both;5 reps;Seated Toe Raises: AROM;Both;10 reps;Seated Heel Raises: AROM;Both;10 reps;Seated   PT Diagnosis:    PT Problem List:   PT Treatment Interventions:     PT Goals Acute Rehab PT Goals PT Goal Formulation: With patient Time For Goal Achievement: 01/22/12 Potential to Achieve Goals: Good Pt will go Supine/Side to Sit: Independently PT Goal: Supine/Side to Sit - Progress: Met Pt will Sit at Edge of Bed: Independently;1-2  min;with no upper extremity support PT Goal: Sit at Edge Of Bed - Progress: Met Pt will go Sit to Supine/Side: Independently PT Goal: Sit to Supine/Side - Progress: Met Pt will go Sit to Stand: Independently PT Goal: Sit to Stand - Progress: Met Pt will go Stand to Sit: Independently PT Goal: Stand to Sit - Progress: Met Pt will Transfer Bed to Chair/Chair to Bed: with supervision PT Transfer Goal: Bed to Chair/Chair to Bed - Progress: Met Pt will Stand: with supervision;1 - 2 min;with no upper extremity support PT Goal: Stand - Progress: Met Pt will Ambulate: >150 feet;Independently PT Goal: Ambulate - Progress: Met Pt will Perform Home Exercise Program: Independently PT Goal: Perform Home Exercise Program - Progress: Met  Visit Information  Last PT Received On: 01/17/12    Subjective Data  Subjective: I had a stress test today and that shot freaked me out.  Patient Stated Goal: Return to work in the long term   Cognition  Overall Cognitive Status: Appears within functional limits for tasks assessed/performed Arousal/Alertness: Awake/alert Orientation Level: Oriented X4 / Intact Behavior During Session: [facility] for tasks performed    Balance  Balance Balance Assessed: No High Level Balance High Level Balance Activites: Turns;Direction changes;Head turns High Level Balance Comments: No LOB or significant change in cadence with balance activities.   End of Session PT - End of Session Equipment Utilized During Treatment: Gait belt Activity Tolerance: Patient tolerated treatment well Patient left: in bed;with call bell/phone within reach Nurse Communication: Mobility status    Giordan Fordham 01/17/2012, 5:05 PM Keyasha Miah L. Dashia Caldeira DPT [phone]

## 2012-01-17 NOTE — Progress Notes (Signed)
Nutrition Follow-up -Brief  RD following up with calorie count. Pt had two meals yesterday after being NPO in the morning for stress test. PO intake was 100% of lunch and dinner, this provided about 1730 kcal and 53 gm protein. Current nutrition needs are 2000-2200 kcal and 90-110 gm protein. Pt is likely able to meet 100% of needs through oral intake. RD agrees with d/c of panda and TF.   RD will d/c remainder of calorie count.  RD will continue to follow with pt.   Clarene Duke MARIE Pager # [phone]

## 2012-01-17 NOTE — Progress Notes (Signed)
Cosign for Rashida Haney RN assessment, med admin, I&O, and notes 

## 2012-01-17 NOTE — Progress Notes (Signed)
See our rounding note from earlier -- patient had abnormal Myoview and needs cath. Is on Coumadin currently. Dopplers 4/28 indicate no DVT but superficial thrombosis in cephalic vein bilaterally. Discussed briefly with Dr. Molli Knock who has not seen the patient today but knew him from earlier this admission - per discussion, okay to stop Lovenox/Coumadin for now in order to let INR drift down. Will plan to do cath when INR </= 1.8. Per Dr. Shirlee Latch, will start DVT dose Lovenox per pharmacy once INR <2 (which will need to be held the day of cath, so will request afternoon dosing to allow time for decision making in AM). INR may not be low enough tomorrow but will keep NPO after midnight just in case.  CBC is also ordered for AM given jump in WBC which may also need to be considered when planning timing of cath.  Angel Holway PA-C

## 2012-01-17 NOTE — Progress Notes (Signed)
Name: Angel [name] DOB: Sep 01, 1966 MRN: [id number] PCP: No primary provider on file. 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 His family states that he abuses some perscription drugs and some occasional alcohol. Smokes 2 PPD history. Daughter tried to call him 4/21 all day but he was sleeping which is unusual for him. His friend found him shaking violently around 8 PM and called 911 who intubated in the field for agonal breathing. He was unresponsive at the time. He was brought to the ED 4/21 and placed on hypothermia protocol. No ischemic EKG changes on ED arrival. UDS positive for cocaine and THC.  He subsequently made a dramatic recovery neurologically but remains trach/ft dep.   Lines / Drains: ETT 4/21>>> 5/2 CVC 4/21>>> out Janina Mayo Molli Knock) 5/2>>>5/15  Micro: Blood 4/21>>>HAEMOPHILUS INFLUENZAE Urine 4/21>>>Neg  Sputum 4/21>>>GNR and GPC - NORMAL FLORA BC 4/27>>>neg BAL 4/27>>>neg Urine 4/27>>>neg BAL 4/30>>>neg  Abx Vancomycin 4/21>5/1 Primaxin 4/24> 5/7  SUBJ: I feel great    Vital Signs: Temp:  [98.6 F (37 C)-99 F (37.2 C)] 98.6 F (37 C) (05/15 0607) Pulse Rate:  [76-109] 84  (05/15 1025) Resp:  [16-18] 16  (05/15 0607) BP: (89-118)/(47-75) 106/70 mmHg (05/15 1025) SpO2:  [96 %-98 %] 97 % (05/15 0607) FiO2 (%):  [21 %] 21 % (05/15 0430) Weight:  [59.058 kg (130 lb 3.2 oz)] 59.058 kg (130 lb 3.2 oz) (05/15 0607) I/O last 3 completed shifts: In: 2871 [P.O.:720;  I.V.:646; NG/GT:1505] Out: 2676 [Urine:2675; Stool:1]  Intake/Output Summary (Last 24 hours) at 01/17/12 1118 Last data filed at 01/17/12 1025  Gross per 24 hour  Intake   1331 ml  Output   1576 ml  Net   -245 ml   Physical Examination:  Gen: walking around in room, joking with family HEENT: NCAT, panda in place, trach in place PULM; CTA B CV: RRR, no mgr AB: BS+, soft, nontender Ext: warm, well perfused Neuro: A&OX4, non focal  Labs/Radiology:  CBC  Lab 01/17/12 0510 01/16/12 0500 01/14/12 0700  HGB 13.3 12.9* 14.3  HCT 39.3 38.2* 42.1  WBC 15.0* 10.2 11.1*  PLT 474* 495* 510*     BMET  Lab 01/17/12 0510 01/16/12 0500 01/13/12 0820  NA 140 137 142  K 4.0 4.0 --  CL 101 100 102  CO2 28 27 27   GLUCOSE 92 103* 122*  BUN 25* 34* 34*  CREATININE 0.65 0.62 0.63  CALCIUM 9.0 8.9 9.5  MG -- -- --  PHOS -- -- --      ASSESSMENT AND PLAN 46 year old male with PMH of cocaine, etoh and drug abuse presenting after being found down in a hotel room positive for cocaine and THC. Patient was noted to have vomitus into his airway upon intubation. RUL and RML infiltrate noted. Completed  hypothermia and ARDS protocol and now doing dramatically better.  Neuro:  Post anoxic encephalopathy - Resolved.  Plan:  -Cont supportive care -PT/OT consult -no indication for SNF  Cardiac:  s/p cardiac arrest.  Shock resolved.  5/15 Myoview with scar in base, mid inf wall; ant-septal ischeamia Plan:  -myoview today with ischaemia in ant/septal  segment and inf septal wall -Contine baby ASA -further rec's per cards pending  Pulmonary:  VDRF resolved; trach capped for 24 hours, handling secretions and oxygenating well Persistent infiltrate R lung appears to represent prior pneumonia but needs to be followed up as outpatient Plan: -decannulate -repeat CXR in AM (considering rising WBC) -needs outpatient CXR to ensure resolution of infiltrate   GI:  Protein calorie  malnutrition Dysphagia.   Plan:  -d/c panda -reg diet  ID:  PNA, likely aspiration.  H flu in BAL.  Completed 14 days abx Plan:  -Monitor off abx -repeat CXR/CBC in AM considering rising WBC (no clear source)  Heme:  DVT in bilateral upper ext. Thrombocytopenia resolved.  Neg HITT panel. Plan:  -rx coumadin 3-6 months  Endo:  Hyperglycemia - very mild. No hx of DM Plan:  -Resolved.    Dispo: -Trial cap of trach 5/14, hopefully decannulate 5/15, home 5/16 if nothing planned by cards   Yolonda Kida PCCM Pager: [phone] Cell: [phone] If no response, call [phone]

## 2012-01-17 NOTE — Progress Notes (Signed)
Patient: Tylyn Derwin Caetano Date of Encounter: 01/17/2012, 8:53 AM Admit date: 12/24/2011     Subjective  Patient seen briefly in nuc. No CP, SOB. EKG NSR no acute changes. During test had tightness of breathing & flushing that resolved, no acute EKG changes.   Objective   Telemetry: unable to review as patient is in nuc Physical Exam: Filed Vitals:   01/17/12 0607  BP: 118/75  Pulse: 79  Temp: 98.6 F (37 C)  Resp: 16   General: Well developed thin WM in no acute distress. Head: Normocephalic, atraumatic, sclera non-icteric, no xanthomas, nares are without discharge.  Neck: Collar still in place Lungs: Clear bilaterally to auscultation without wheezes, rales, or rhonchi. Breathing is unlabored. Heart: RRR S1 S2 without murmurs, rubs, or gallops.  Abdomen: Soft, non-tender, non-distended with normoactive bowel sounds. No hepatomegaly. No rebound/guarding. No obvious abdominal masses. Msk:  Strength and tone appear normal for age. Extremities: No clubbing or cyanosis. No edema.  Distal pedal pulses are 2+ and equal bilaterally. Neuro: Alert and oriented X 3. Moves all extremities spontaneously. Psych:  Responds to questions appropriately with a flat affect.    Intake/Output Summary (Last 24 hours) at 01/17/12 0853 Last data filed at 01/17/12 0300  Gross per 24 hour  Intake   1528 ml  Output   1576 ml  Net    -48 ml    Inpatient Medications:    . antiseptic oral rinse  15 mL Mouth Rinse q12n4p  . aspirin  81 mg Oral Daily  . chlorhexidine  15 mL Mouth Rinse BID  . enoxaparin (LOVENOX) injection  1 mg/kg Subcutaneous Q12H  . feeding supplement  30 mL Per Tube BID  . free water  200 mL Per Tube Q8H  . metoprolol tartrate  25 mg Per Tube TID  . regadenoson  0.4 mg Intravenous Once  . sodium chloride  3 mL Intravenous Q12H  . warfarin  15 mg Oral ONCE-1800  . Warfarin - Pharmacist Dosing Inpatient   Does not apply q1800    Labs:  Basename 01/17/12 0510 01/16/12 0500    NA 140 137  K 4.0 4.0  CL 101 100  CO2 28 27  GLUCOSE 92 103*  BUN 25* 34*  CREATININE 0.65 0.62  CALCIUM 9.0 8.9  MG -- --  PHOS -- --    Basename 01/17/12 0510 01/16/12 0500  WBC 15.0* 10.2  NEUTROABS -- --  HGB 13.3 12.9*  HCT 39.3 38.2*  MCV 94.7 94.8  PLT 474* 495*    Radiology/Studies:  1. Chest 2 View 01/17/2012  *RADIOLOGY REPORT*  Clinical Data: Shortness of breath.  CHEST - 2 VIEW  Comparison: PA and lateral chest 01/15/2012.  Findings: Feeding tube and tracheostomy tube remain in place. Focal airspace opacity in the right mid lung seen on the PA view is unchanged.  Left lung remains clear.  The chest is hyperexpanded. No pneumothorax or pleural effusion.  Heart size normal.  IMPRESSION: No change in opacity in the right mid lung seen on the PA view which could be due to pneumonia.  Recommend follow-up films to clearing.  Original Report Authenticated By: Bernadene Bell. Maricela Curet, M.D.    Assessment and Plan  1. Cardiac arrest in the setting of polysubstance abuse including cocaine/THC. 2. NSTEMI earlier this admission - for Lexiscan today. Cont. BB. 3. Initial LV dysfunction, improved to EF 60-65%. 4. ID: PNA, likely aspiration, Blood cx + for H Flu 4/21 (repeat cultures 4/27 neg). Remains  afebrile, off antibiotics but rising WBC. 5. Heme: initially had thrombocytopenia which has resolved (neg HIT panel) followed by thrombocytosis which is improving. 6. Acute respiratory failure - transitioning off trach collar per PCCM. 7. Post anoxic encephalopathy - improving.  8. Bilateral superficial cephalic vein thromboses - See dopplers from earlier on this hospitalization. Have raised question of whether or not this truly requires anticoagulation - will defer to PCCM to review, given reference to DVT in prior notes.  MD to follow later following nuclear study results.  Signed, Ronie Spies PA-C  Patient seen with PA, agree with the above note.  He had an abnormal myoview as  described above with multiple involved segments.  However, overall EF was preserved.  I think that he needs a cardiac catheterization.  He is on coumadin now in the setting of superficial upper extremity vein thrombosis (cephalic vein).  He was initially started on anticoagulation due to concern for HIT.  However, HIT has been ruled out.  I am not sure that he needs ongoing anticoagulation for superficial venous thrombosis.  We will talk to CCM.  If they agree, will stop coumadin and plan cath when INR </= 1.8.   [name] 01/17/2012 2:00 PM

## 2012-01-17 NOTE — Consult Note (Addendum)
ANTICOAGULATION CONSULT NOTE - Follow Up Consult  Pharmacy Consult for Coumadin with Lovenox bridging Indication: Bilateral cephalic vein thromboses  No Known Allergies  Patient Measurements: Height: 6\' 2"  (188 cm) Weight: 130 lb 3.2 oz (59.058 kg) (scale C) IBW/kg (Calculated) : 82.2    Vital Signs: Temp: 98.6 F (37 C) (05/15 0607) Temp src: Oral (05/15 0607) BP: 106/70 mmHg (05/15 1025) Pulse Rate: 84  (05/15 1025)  Labs:  Basename 01/17/12 0510 01/16/12 0500 01/15/12 0650  HGB 13.3 12.9* --  HCT 39.3 38.2* --  PLT 474* 495* --  APTT -- -- --  LABPROT 24.9* 17.6* 14.5  INR 2.21* 1.42 1.11  HEPARINUNFRC -- -- --  CREATININE 0.65 0.62 --  CKTOTAL -- -- --  CKMB -- -- --  TROPONINI -- -- --   Estimated Creatinine Clearance: 95.4 ml/min (by C-G formula based on Cr of 0.65).   Medications:  Scheduled:    . antiseptic oral rinse  15 mL Mouth Rinse q12n4p  . aspirin  81 mg Oral Daily  . chlorhexidine  15 mL Mouth Rinse BID  . feeding supplement  30 mL Per Tube BID  . metoprolol tartrate  25 mg Per Tube TID  . regadenoson  0.4 mg Intravenous Once  . sodium chloride  3 mL Intravenous Q12H  . warfarin  15 mg Oral ONCE-1800  . Warfarin - Pharmacist Dosing Inpatient   Does not apply q1800  . DISCONTD: enoxaparin (LOVENOX) injection  1 mg/kg Subcutaneous Q12H  . DISCONTD: free water  200 mL Per Tube Q8H   Infusions:    . sodium chloride 20 mL/hr (01/17/12 1610)  . DISCONTD: feeding supplement (OSMOLITE 1.2 CAL) 1,000 mL (01/16/12 1421)   Anti-infectives     Start     Dose/Rate Route Frequency Ordered Stop   01/01/12 1200   imipenem-cilastatin (PRIMAXIN) 500 mg in sodium chloride 0.9 % 100 mL IVPB        500 mg 200 mL/hr over 30 Minutes Intravenous 4 times per day 01/01/12 0943 01/10/12 1004   12/31/11 1400   imipenem-cilastatin (PRIMAXIN) 500 mg in sodium chloride 0.9 % 100 mL IVPB  Status:  Discontinued        500 mg 200 mL/hr over 30 Minutes Intravenous 3  times per day 12/31/11 1121 01/01/12 0943   12/31/11 1300   vancomycin (VANCOCIN) 1,250 mg in sodium chloride 0.9 % 250 mL IVPB  Status:  Discontinued        1,250 mg 166.7 mL/hr over 90 Minutes Intravenous Every 12 hours 12/31/11 1120 01/03/12 1105   12/25/11 0600   piperacillin-tazobactam (ZOSYN) IVPB 3.375 g  Status:  Discontinued        3.375 g 12.5 mL/hr over 240 Minutes Intravenous 3 times per day 12/24/11 2257 12/31/11 1028   12/24/11 2359   vancomycin (VANCOCIN) IVPB 1000 mg/200 mL premix  Status:  Discontinued        1,000 mg 200 mL/hr over 60 Minutes Intravenous Daily at bedtime 12/24/11 2301 12/29/11 1009   12/24/11 2045  piperacillin-tazobactam (ZOSYN) 3.375 g in dextrose 5 % 50 mL IVPB       3.375 g 100 mL/hr over 30 Minutes Intravenous To [facility] 12/24/11 2011 12/24/11 2206   12/24/11 2015   vancomycin (VANCOCIN) IVPB 1000 mg/200 mL premix  Status:  Discontinued        1,000 mg 200 mL/hr over 60 Minutes Intravenous  Once 12/24/11 2011 12/24/11 2301  Assessment:  [name] being managed on full-dose Lovenox to bridge to therapeutic INR with coumadin for bilateral cephalic vein thromboses  INR 9.60  >  2.21.  Day # 1 of 2 minimum of Lovenox overlap with INR > 2  No complications noted  Goal of Therapy:   INR 2-3   Plan:   Continue Lovenox a minimum of another 24 hours for overlap of ~ 48 hours with therapeutic INR.  Coumadin 10 mg today.  Angel [name], Angel [name], Pharm.D. 01/17/2012 1:52 PM  Amended Assessment/Plan:   Noted per MD note that patient to have scheduled a cardiac cath due to results of Myoview.  Coumadin and Lovenox are now on hold in order for INR to drop below 1.8 for cath.   Will recheck INR tonight, then continue as ordered.  When heparin coverage is to be restarted, I recommend a Heparin Infusion which would allow short notice for stopping prior to cardiac cath.  Lovenox at therapeutic doses is scheduled every 12 hours  and afternoon start would still require another dose in early AM.  Please advise.  Rubyann Lingle, Angel [name], Pharm.D. 01/17/2012 3:07 PM

## 2012-01-17 NOTE — Progress Notes (Signed)
**Note De-Identified  Obfuscation** Patient decannulated per MD order, dressing applied to stoma per protocol, bbs clr, vs wnl

## 2012-01-18 ENCOUNTER — Inpatient Hospital Stay (HOSPITAL_COMMUNITY): Payer: Medicaid Other

## 2012-01-18 LAB — CBC
MCH: 31.8 pg (ref 26.0–34.0)
MCHC: 33.6 g/dL (ref 30.0–36.0)
Platelets: 428 10*3/uL — ABNORMAL HIGH (ref 150–400)
RBC: 4.03 MIL/uL — ABNORMAL LOW (ref 4.22–5.81)

## 2012-01-18 LAB — PROTIME-INR
INR: 1.96 — ABNORMAL HIGH (ref 0.00–1.49)
Prothrombin Time: 22.7 seconds — ABNORMAL HIGH (ref 11.6–15.2)

## 2012-01-18 MED ORDER — SODIUM CHLORIDE 0.9 % IJ SOLN
3.0000 mL | Freq: Two times a day (BID) | INTRAMUSCULAR | Status: DC
  Administered 2012-01-18 (×2): 3 mL via INTRAVENOUS

## 2012-01-18 MED ORDER — ACETAMINOPHEN 325 MG PO TABS
650.0000 mg | ORAL_TABLET | ORAL | Status: DC | PRN
  Administered 2012-01-18: 650 mg via ORAL
  Filled 2012-01-18: qty 2

## 2012-01-18 MED ORDER — SODIUM CHLORIDE 0.9 % IV SOLN: 250.0000 mL | INTRAVENOUS | Status: DC | PRN

## 2012-01-18 MED ORDER — SODIUM CHLORIDE 0.9 % IV SOLN
INTRAVENOUS | Status: DC
  Administered 2012-01-19: 06:00:00 via INTRAVENOUS

## 2012-01-18 MED ORDER — SODIUM CHLORIDE 0.9 % IJ SOLN: 3.0000 mL | INTRAMUSCULAR | Status: DC | PRN

## 2012-01-18 MED ORDER — ASPIRIN 81 MG PO CHEW
324.0000 mg | CHEWABLE_TABLET | ORAL | Status: AC
Start: 2012-01-19 — End: 2012-01-19
  Administered 2012-01-19: 324 mg via ORAL

## 2012-01-18 NOTE — Progress Notes (Signed)
PROGRESS NOTE  Subjective:   Pt is doing better.  Myoview yesterday shows   IMPRESSION:  This study reveals scar at the base and mid inferior wall and the  base and mid inferoseptal wall. There is ischemia that is mild in  the anteroseptal segments and the base of the inferolateral wall.  There is currently a focal wall motion abnormality affecting the  septum.  Pt is pain free.  Will need cath.  Have scheduled it for tomorrow.   Objective:    Vital Signs:   Temp:  [97.8 F (36.6 C)-98.2 F (36.8 C)] 98 F (36.7 C) (05/16 0730) Pulse Rate:  [76-109] 79  (05/16 0730) Resp:  [18] 18  (05/16 0730) BP: (89-123)/(47-73) 112/72 mmHg (05/16 0730) SpO2:  [98 %-99 %] 98 % (05/16 0730) Weight:  [130 lb (58.968 kg)] 130 lb (58.968 kg) (05/16 0730)  Last BM Date: 01/18/12   24-hour weight change: Weight change:   Weight trends: Filed Weights   01/16/12 0511 02/15/2012 0607 01/18/12 0730  Weight: 129 lb 3 oz (58.6 kg) 130 lb 3.2 oz (59.058 kg) 130 lb (58.968 kg)    Intake/Output:  Feb 15, 2023 0701 - 05/16 0700 In: 690 [P.O.:462; I.V.:3; NG/GT:225] Out: -      Physical Exam: BP 112/72  Pulse 79  Temp(Src) 98 F (36.7 C) (Oral)  Resp 18  Ht 6\' 2"  (1.88 m)  Wt 130 lb (58.968 kg)  BMI 16.69 kg/m2  SpO2 98%  General: Vital signs reviewed and noted. Well-developed, well-nourished, in no acute distress; alert, appropriate and cooperative throughout examination.  Head: Normocephalic, atraumatic.  Eyes: conjunctivae/corneas clear. PERRL, EOM's intact. Fundi benign.  Throat: Normal,s/p trach  Neck: Supple. Normal carotids. No JVD  Lungs:  Clear bilaterally to auscultation without wheezes, rales, or rhonchi. Breathing is unlabored.  Heart: Regular rate,  With normal  S1 S2. No murmurs, gallops or rubs  Abdomen:  Soft, non-tender, non-distended with normoactive bowel sounds. No hepatomegaly. No rebound/guarding. No abdominal masses.  Extremities: Distal pedal pulses are 2+ .  No  edema.    Neurologic: A&O X3, CN II - XII are grossly intact. Motor strength is 5/5 in the all 4 extremities.  Psych: Responds to questions appropriately with normal affect.    Labs: BMET:  Basename 2012-02-15 0510 01/16/12 0500  NA 140 137  K 4.0 4.0  CL 101 100  CO2 28 27  GLUCOSE 92 103*  BUN 25* 34*  CREATININE 0.65 0.62  CALCIUM 9.0 8.9  MG -- --  PHOS -- --    Liver function tests: No results found for this basename: AST:2,ALT:2,ALKPHOS:2,BILITOT:2,PROT:2,ALBUMIN:2 in the last 72 hours No results found for this basename: LIPASE:2,AMYLASE:2 in the last 72 hours  CBC:  Basename 01/18/12 0605 02/15/2012 0510  WBC 11.2* 15.0*  NEUTROABS -- --  HGB 12.8* 13.3  HCT 38.1* 39.3  MCV 94.5 94.7  PLT 428* 474*    Cardiac Enzymes: No results found for this basename: CKTOTAL:4,CKMB:4,TROPONINI:4 in the last 72 hours  Coagulation Studies:  Basename 01/18/12 0605 02-15-2012 0510 01/16/12 0500  LABPROT 22.7* 24.9* 17.6*  INR 1.96* 2.21* 1.42    Other:    Tele:  NSR  Medications:    Infusions:    . sodium chloride 20 mL/hr (02/15/2012 1610)  . DISCONTD: feeding supplement (OSMOLITE 1.2 CAL) 1,000 mL (01/16/12 1421)    Scheduled Medications:    . antiseptic oral rinse  15 mL Mouth Rinse q12n4p  . aspirin  81 mg Oral  Daily  . chlorhexidine  15 mL Mouth Rinse BID  . feeding supplement  30 mL Per Tube BID  . metoprolol tartrate  25 mg Per Tube TID  . regadenoson  0.4 mg Intravenous Once  . sodium chloride  3 mL Intravenous Q12H  . DISCONTD: enoxaparin (LOVENOX) injection  1 mg/kg Subcutaneous Q12H  . DISCONTD: free water  200 mL Per Tube Q8H  . DISCONTD: warfarin  10 mg Oral ONCE-1800  . DISCONTD: Warfarin - Pharmacist Dosing Inpatient   Does not apply q1800    Assessment/ Plan:    1. S/p cardiac arrest.  Due to multi - drug abuse - cocaine, THC. ETOH.  Abnormal myoview - will schedule for cath tomorrow.  Have discussed the risks, benefits, options.  He  understands and agrees to proceed   Disposition: cath tomorrow.  May eat today. NPO tomorrow. Length of Stay: 25  Vesta Mixer, Montez Hageman., MD, [facility] 01/18/2012, 8:08 AM Office [phone] Pager [phone]

## 2012-01-18 NOTE — Progress Notes (Signed)
Cosign for Rashida Haney RN assessment, med admin, I&O, and notes 

## 2012-01-18 NOTE — Discharge Summary (Addendum)
Physician Discharge Summary     Patient ID: Angel [name] MRN: [id number] DOB/AGE: 1965/10/11 46 y.o.  Admit date: 12/24/2011 Discharge date: 01/19/2012  Admission Diagnoses: Cardiac arrest  Discharge Diagnoses:  Active Problems:  Acute respiratory failure  Pneumonia, organism unspecified  Acidosis  Altered mental status  Cardiac arrest  Anoxic encephalopathy  Thrombocytopenia  Cardiomyopathy secondary  Tracheostomy status  DVT of upper extremity (deep vein thrombosis), left  Dysphagia   Significant Hospital tests/ studies/ interventions and procedures  Lines / Drains:  ETT 4/21>>> 5/2  CVC 4/21>>> out  Janina Mayo Molli Knock) 5/2>>>5/15   Micro:  Blood 4/21>>>HAEMOPHILUS INFLUENZAE Urine 4/21>>>Neg  Sputum 4/21>>>GNR and GPC - NORMAL FLORA  BC 4/27>>>neg  BAL 4/27>>>neg  Urine 4/27>>>neg  BAL 4/30>>>neg   Abx  Vancomycin 4/21>5/1  Primaxin 4/24> 5/7  Tests/diagnostics 2-D echocardiogram 4/22: ejection fraction 20-25% with diffuse hypokinesis, Doppler parameters were consistent with abnormal left ventricular relaxation, grade 1 diastolic dysfunction. Lower extremity ultrasound 4/27: No evidence of superficial or deep vein thrombus bilaterally. Upper extremity ultrasound 4/28: no evidence of deep vein thrombosis in either upper extremity. There did appear to be superficial thrombi in the cephalic vein bilaterally. 2-D echocardiogram 5/6: ejection fraction felt to be between 60 and 65%. Cavity size is normal. Myoview 5/15: This study reveals scar at the base and mid inferior wall and the base and mid inferoseptal wall. There is ischemia that is mild in the anteroseptal segments and the base of the inferolateral wall. There is currently a focal wall motion abnormality affecting the septum. Cardiac Cath 5/17>>>Final Conclusions: No significant CAD. Normal LV systolic function.  Brief Patient Profile: 46 YO man with no past medical history. His family states that he abuses  some perscription drugs and some occasional alcohol. Smokes 2 PPD history. Daughter tried to call him 4/21 all day but he was sleeping which is unusual for him. His friend found him shaking violently around 8 PM and called 911 who intubated in the field for agonal breathing. He was unresponsive at the time. He was brought to the ED 4/21 and placed on hypothermia protocol. No ischemic EKG changes on ED arrival. UDS positive for cocaine and THC. He subsequently made a dramatic recovery neurologically but remains trach/ft dep. Further evaluation did demonstrate urine drug screen positive for cocaine.   Hospital course by discharge dx.   s/p cardiac arrest. Shock resolved.  This is a 46 year old male patient who presented to the emergency room on 4/21 after being found by friends shaking violently. Upon EMS arrival he was noted to be in PEA arrest. He ACLS algorithms were initiated. He was intubated, and transported to the emergency room for further evaluation. He was admitted to the intensive care team, and started on the hypothermia protocol. He was supported in the intensive care, his hospital course was complicated by acute delirium, and post anoxic encephalopathy. He remained orally intubated until 5/2, at which time a tracheostomy was placed. He was eventually liberated from the mechanical ventilator, and tracheostomy was removed on 5/15. Cardiology consultation was obtained, recommendations were given, and he is status post a Myoview on 5/15. This showed the following: scar in base, mid inf wall; ant-septal ischemia. Based on these findings he underwent cardiac catheterization on 5/17 with the following findings: no significant CAD.  he is now medically cleared for discharge to home with the following. Plan:  -daily aspirin -Beta blocker - Acute respiratory failure/VDRF, with failure to wean (resolved) He was intubated during initial ACLS algorithm.  As mentioned above he remained ventilator dependent  due to mental status, and complicated by pneumonia. He ultimately required tracheostomy due to prolonged ventilator requirement. He was eventually liberated from the ventilator, transition to aerosol trach collar, and then underwent trials where trach was capped for 24 hours, handling secretions and oxygenating well. Following successful tracheostomy capping trials he was decannulated on  on 5/15. Plan: -keep occlusive dressing on trach site and change daily -follow up in out-pt pulm clinic  Pneumonia.  He did have BC positive for H-flu. He was treated for what was felt to be related aspiration pneumonia. He is now s/p complete course of antibiotics. Persistent infiltrate R lung noted on 5/16 appears to represent prior pneumonia but needs to be followed up in the outpatient clinic for resolution. Plan -Follow-up PA and lateral chest x-ray in our clinic 3 weeks after d/c, if the above rounded infiltrate is still present will need CT of chest to rule out mass.    Post anoxic encephalopathy  This complicated his intensive care stay. As mentioned above he had undergone the hypothermia protocol. He made slow and steady improvement. Upon time of discharge she is now awake oriented able to follow commands and is appropriate. He was seen by physical therapy and occupational therapy, given his rapid improvement, it was felt no followup was required at home. Plan:  -home with family  Protein calorie malnutrition  following critical illness, this is improving daily. He is tolerating diet well. Plan -Home on regular diet  Dysphagia.  This was present following critical illness, he did require tube feedings. This is now resolved. Plan:  -dysphagia diet with nectar thick liquids -he will need follow up MBS which has been scheduled for May 24th at 1045.     bilateral superficial upper extremity thrombus  This was identified after noting progressive upper extremity swelling and evaluated upper extremity  ultrasound on 4/28.   Neg HITT panel. He was placed on oral anti-coagulation w/ coumadin for this initially.  Plan:  -no further rx.   Hyperglycemia - very mild. No hx of DM  Plan:  -Resolved.    Discharge Exam: BP 129/76  Pulse 81  Temp(Src) 97.8 F (36.6 C) (Oral)  Resp 20  Ht 6\' 2"  (1.88 m)  Wt 59.6 kg (131 lb 6.3 oz)  BMI 16.87 kg/m2  SpO2 99%  Gen: walking around in room, joking with family  HEENT: NCAT, panda in place, trach in place  PULM; CTA B  CV: RRR, no mgr  AB: BS+, soft, nontender  Ext: warm, well perfused  Neuro: A&OX4, non focal   Labs at discharge Lab Results  Component Value Date   CREATININE 0.74 01/19/2012   BUN 26* 01/19/2012   NA 139 01/19/2012   K 4.0 01/19/2012   CL 99 01/19/2012   CO2 30 01/19/2012   Lab Results  Component Value Date   WBC 8.7 01/19/2012   HGB 15.1 01/19/2012   HCT 44.6 01/19/2012   MCV 95.3 01/19/2012   PLT 445* 01/19/2012   Lab Results  Component Value Date   ALT 75* 01/02/2012   AST 34 01/02/2012   ALKPHOS 186* 01/02/2012   BILITOT 0.5 01/02/2012   Lab Results  Component Value Date   INR 1.67* 01/19/2012   INR 1.96* 01/18/2012   INR 2.21* 01/17/2012    Current radiology studies Dg Chest 2 View  01/18/2012  *RADIOLOGY REPORT*  Clinical Data: The ventilator.  CHEST - 2 VIEW  Comparison: 01/17/2012  Findings: Rounded opacity in the right mid lung again noted, unchanged.  While this could reflect residual infiltrate/pneumonia, I cannot exclude mass.  Recommend continued follow up. This is located posteriorly on the lateral view.  This was not present on the lateral view on the prior two-view chest, therefore I favor infection.  Mild hyperinflation of the lungs.  Scarring in the upper lobes.  No effusions.  Heart is normal size.  IMPRESSION: Continued rounded opacity in the right mid lung.  Favor infection. Recommend continued follow up.  Original Report Authenticated By: Cyndie Chime, M.D.    Disposition:  Final discharge  disposition not confirmed  Discharge Orders    Future Appointments: Provider: Department: Dept Phone: Center:   01/26/2012 11:00 AM [facility] Speech Therapist [facility]  None   02/02/2012 3:30 PM Ok Anis, NP [facility] [phone] [facility]   02/07/2012 9:15 AM Julio Sicks, NP [facility] [phone] None     Future Orders Please Complete By Expires   Diet - low sodium heart healthy      Comments:   Nectar thick with liquids.   Increase activity slowly      Discharge wound care:      Comments:   Change dressing for trach site daily until completely healed.  Clean with soap and water.     Medication List  As of 01/19/2012  3:46 PM   STOP taking these medications         AFRIN NASAL SPRAY 0.05 % nasal spray      BC HEADACHE POWDER PO      fish oil-omega-3 fatty acids 1000 MG capsule         TAKE these medications         aspirin 81 MG chewable tablet   Chew 1 tablet (81 mg total) by mouth daily.      calcium carbonate 600 MG Tabs   Commonly known as: OS-CAL   Take 600 mg by mouth 2 (two) times daily with a meal.      food thickener Powd   Commonly known as: RESOURCE THICKENUP CLEAR   Thicken liquids to nectar thick consistency      metoprolol succinate 25 MG 24 hr tablet   Commonly known as: TOPROL-XL   Take 1 tablet (25 mg total) by mouth daily.      mulitivitamin with minerals Tabs   Take 1 tablet by mouth daily.           Follow-up Information    Follow up with PARRETT,TAMMY, NP on 02/07/2012. (at 9am for cxr then your appointment following the CXR. )    Contact information:   [facility] [address] [place] [address] [phone]       Follow up with Nicolasa Ducking, NP on 02/02/2012. (Appt at 3:30 pm)    Contact information:   [address] [place] [address] [phone]    Modified barium Swallow: [facility] radiology department: 1st floor.  Check in at 1045am on 5/24 (friday), for your follow up swallowing test [facility])              Discharged Condition: good  Physician Statement:   The Patient was personally examined, the discharge assessment and plan has been personally reviewed and I agree with ACNP Babcock's assessment and plan. > 30 minutes of time have been dedicated to discharge assessment, planning and discharge instructions.   Signed: BABCOCK,PETE 01/19/2012, 3:46 PM  [facility] [place] P[facility] Pager: [phone] Cell: [phone] If no response, call [phone]  

## 2012-01-18 NOTE — Progress Notes (Signed)
Occupational Therapy Treatment Patient Details Name: Angel [name] MRN: [id number] DOB: 1966/07/05 Today's Date: 01/18/2012 Time: 4098-1191 OT Time Calculation (min): 19 min  OT Assessment / Plan / Recommendation Comments on Treatment Session Pt has made much progress since last week. Pt currently at I/Mod I level and with no further need for OT in the hospital. Gave pt HEP for strengthening and instructed him to perform daily.     Follow Up Recommendations  No OT follow up    Barriers to Discharge       Equipment Recommendations  None recommended by OT    Recommendations for Other Services    Frequency     Plan Discharge plan needs to be updated;All goals met and education completed, patient discharged from OT services    Precautions / Restrictions Precautions Precautions: Fall Restrictions Weight Bearing Restrictions: No   Pertinent Vitals/Pain Pt denies any pain at this time    ADL  Eating/Feeding: Simulated;Independent Where Assessed - Eating/Feeding: Edge of bed Grooming: Performed;Wash/dry hands;Independent Where Assessed - Grooming: Standing at sink Upper Body Bathing: Simulated;Independent Where Assessed - Upper Body Bathing: Standing at sink Lower Body Bathing: Simulated;Independent Where Assessed - Lower Body Bathing: Standing at sink Upper Body Dressing: Performed;Independent Where Assessed - Upper Body Dressing: Sitting, bed Lower Body Dressing: Simulated;Independent Where Assessed - Lower Body Dressing: Sit to stand from bed Toilet Transfer: Performed;Independent Toilet Transfer Method:  (sit to stand from toilet) Toilet Transfer Equipment: Regular height toilet Toileting - Clothing Manipulation: Simulated;Independent Where Assessed - Toileting Clothing Manipulation: Standing Toileting - Hygiene: Simulated;Independent Where Assessed - Toileting Hygiene: Sit to stand from 3-in-1 or toilet Tub/Shower Transfer: Simulated;Modified independent (steady self  with wall) Tub/Shower Transfer Method: Ambulating Equipment Used: Gait belt Transfers/Ambulation Related to ADLs: I with ambulation within room ADL Comments: Pt has made great progress. States he hopes to return to work soon at 15-20 hrs per week    OT Diagnosis:    OT Problem List:   OT Treatment Interventions:     OT Goals ADL Goals ADL Goal: Grooming - Progress: Met ADL Goal: Upper Body Dressing - Progress: Met ADL Goal: Lower Body Dressing - Progress: Met ADL Goal: Toilet Transfer - Progress: Met ADL Goal: Toileting - Clothing Manipulation - Progress: Met  Visit Information  Last OT Received On: 01/18/12 Assistance Needed: +1               Cognition  Overall Cognitive Status: Appears within functional limits for tasks assessed/performed Arousal/Alertness: Awake/alert Orientation Level: Oriented X4 / Intact Behavior During Session: [facility] for tasks performed    Mobility Bed Mobility Supine to Sit: 6: Modified independent (Device/Increase time);With rails Sitting - Scoot to Edge of Bed: 7: Independent Sit to Supine: 6: Modified independent (Device/Increase time);With rail Transfers Sit to Stand: 7: Independent;From bed;From toilet Stand to Sit: 7: Independent;To bed;To toilet   Exercises    Balance    End of Session OT - End of Session Equipment Utilized During Treatment: Gait belt Activity Tolerance: Patient tolerated treatment well Patient left: in bed;with call bell/phone within reach   Arthea Nobel 01/18/2012, 9:57 AM

## 2012-01-18 NOTE — Progress Notes (Addendum)
Speech Language Pathology Dysphagia Treatment Patient Details Name: Angel [name] MRN: [id number] DOB: 1966/04/12 Today's Date: 01/18/2012 Time: 4098-1191 SLP Time Calculation (min): 12 min  Assessment / Plan / Recommendation Clinical Impression  F/u at bedside revealed no overt s/s of aspiration with current diet with SLP providing subtle verbal cues/reminders for slow rate of intake as patient mildly impulsive with rapid rate of intake (likely his baseline). Patient decannulated, vocal quality clear and strong and respiratory status WFL. Overall, patient appears to be tolerating current diet. Pending d/c plans, will repeat MBS early next week if patient remains inpatient. Otherwise, recommend f/u OP MBS  in 1-2 weeks after d/c. Education complete with patient regarding need to continue thickened liquids at home if this is the case.   Also note that patient has met all cognitive-linguistic goals as well with cognition suspected to be at baseline level and speech intelligibility at 100% s/p decannulation. Will continue to f/u for dysphagia only.     Diet Recommendation  Continue with Current Diet: Dysphagia 3 (mechanical soft);Nectar-thick liquid    SLP Plan Continue with current plan of care   Pertinent Vitals/Pain n/a   Swallowing Goals  SLP Swallowing Goals Patient will consume recommended diet without observed clinical signs of aspiration with: Supervision/safety Swallow Study Goal #1 - Progress: Progressing toward goal Patient will utilize recommended strategies during swallow to increase swallowing safety with: Supervision/safety Swallow Study Goal #2 - Progress: Progressing toward goal   SLP cognitive-linguistic Goals  SLP Goals SLP Goal #1: Pt will demonstrate emergent awareness of deficits during functional tasks with min verbal cues SLP Goal #1 - Progress: Met SLP Goal #2: Pt will complete basic functional problem solving tasks with minimal contextual and verbal cues.  SLP Goal  #2 - Progress: Met SLP Goal #3: Pt will increase volume of speech with minimal verbal cues at conversation level.  SLP Goal #3 - Progress: Met SLP Goal #4: Pt will increase strength of cough for oral expectoration of secretions with moderate verbal cues.  SLP Goal #4 - Progress: Met   General Temperature Spikes Noted: No Respiratory Status: Room air (decannulated) Behavior/Cognition: Alert;Cooperative;Pleasant mood Oral Cavity - Dentition: Adequate natural dentition Patient Positioning: Upright in bed   Dysphagia Treatment Treatment focused on: Skilled observation of diet tolerance;Patient/family/caregiver education Treatment Methods/Modalities: Skilled observation Patient observed directly with PO's: Yes Type of PO's observed: Dysphagia 3 (soft);Nectar-thick liquids Feeding: Able to feed self Liquids provided via: Cup Type of cueing: Verbal Amount of cueing: Minimal   [name] MA, CCC-SLP [phone]  Veneta Sliter [name] 01/18/2012, 9:27 AM

## 2012-01-18 NOTE — Progress Notes (Signed)
UR Completed. Leonie Amacher, RN, Nurse Case Manager [phone]     

## 2012-01-18 NOTE — Progress Notes (Signed)
Name: Angel [name] DOB: [birth year] MRN: [id number] PCP: No primary provider on file. 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 His family states that he abuses some perscription drugs and some occasional alcohol. Smokes 2 PPD history. Daughter tried to call him 4/21 all day but he was sleeping which is unusual for him. His friend found him shaking violently around 8 PM and called 911 who intubated in the field for agonal breathing. He was unresponsive at the time. He was brought to the ED 4/21 and placed on hypothermia protocol. No ischemic EKG changes on ED arrival. UDS positive for cocaine and THC.  He subsequently made a dramatic recovery neurologically but remains trach/ft dep.   Lines / Drains: ETT 4/21>>> 5/2 CVC 4/21>>> out Janina Mayo Molli Knock) 5/2>>>5/15  Micro: Blood 4/21>>>HAEMOPHILUS INFLUENZAE Urine 4/21>>>Neg  Sputum 4/21>>>GNR and GPC - NORMAL FLORA BC 4/27>>>neg BAL 4/27>>>neg Urine 4/27>>>neg BAL 4/30>>>neg  Abx Vancomycin 4/21>5/1 Primaxin 4/24> 5/7  SUBJ: I want to leave    Vital Signs: Temp:  [97.8 F (36.6 C)-98.2 F (36.8 C)] 98 F (36.7 C) (05/16 0730) Pulse Rate:  [79-101] 79  (05/16 0730) Resp:  [18] 18  (05/16 0730) BP: (106-123)/(63-73) 112/72 mmHg (05/16 0730) SpO2:  [98 %-99 %] 98 % (05/16 0730) Weight:  [58.968 kg (130 lb)] 58.968 kg (130 lb) (05/16 0730) I/O last 3 completed shifts: In: 1298 [P.O.:582; I.V.:166; NG/GT:550] Out: 575  [Urine:575]  Intake/Output Summary (Last 24 hours) at 01/18/12 1023 Last data filed at 01/18/12 0951  Gross per 24 hour  Intake    890 ml  Output      0 ml  Net    890 ml   Physical Examination:  Gen: walking around in room, joking with family HEENT: NCAT, trach out, dressing in place over stoma PULM; CTA B CV: RRR, no mgr AB: BS+, soft, nontender Ext: warm, well perfused Neuro: A&OX4, non focal  Labs/Radiology:  CBC  Lab 01/18/12 0605 01/17/12 0510 01/16/12 0500  HGB 12.8* 13.3 12.9*  HCT 38.1* 39.3 38.2*  WBC 11.2* 15.0* 10.2  PLT 428* 474* 495*     BMET  Lab 01/17/12 0510 01/16/12 0500 01/13/12 0820  NA 140 137 142  K 4.0 4.0 --  CL 101 100 102  CO2 28 27 27   GLUCOSE 92 103* 122*  BUN 25* 34* 34*  CREATININE 0.65 0.62 0.63  CALCIUM 9.0 8.9 9.5  MG -- -- --  PHOS -- -- --      ASSESSMENT AND PLAN 46 year old male with PMH of cocaine, etoh and drug abuse presenting after being found down in a hotel room positive for cocaine and THC. Patient was noted to have vomitus into his airway upon intubation. RUL and RML infiltrate noted. Completed  hypothermia and ARDS protocol and now doing dramatically better.  Neuro:  Post anoxic encephalopathy - Resolved.  Plan:  -Cont supportive care -PT/OT consult -no indication for SNF  Cardiac:  s/p cardiac arrest.  Shock resolved.  5/15 Myoview with scar in base, mid inf wall; ant-septal ischeamia Plan:  -Contine baby ASA, tele -LHC 5/17  Pulmonary:  VDRF resolved; trach capped  for 24 hours, handling secretions and oxygenating well Persistent infiltrate R lung appears to represent prior pneumonia but needs to be followed up as outpatient Plan: -decannulate -repeat CXR in AM (considering rising WBC) -needs outpatient CXR to ensure resolution of infiltrate three weeks after discharge  GI:  Protein calorie malnutrition Dysphagia.   Plan:  -d/c panda -reg diet  ID:  PNA, likely aspiration.  H flu in BAL.   Completed 14 days abx Plan:  -Monitor off abx -repeat CXR/CBC in AM considering rising WBC (no clear source)  Heme:  Cephalic vein clot (NOT DVT) in bilateral upper extremity.  Has been anticoagulated for the same with wafarin.  No indication for warfarin in this setting.   Thrombocytopenia resolved.  Neg HITT panel. Plan:  -d/c warfarin  Endo:  Hyperglycemia - very mild. No hx of DM Plan:  -Resolved.    Dispo: -LHC 5/17, dispo pending results   Yolonda Kida PCCM Pager: [phone] Cell: [phone] If no response, call [phone]

## 2012-01-19 ENCOUNTER — Encounter (HOSPITAL_COMMUNITY)
Admission: EM | Disposition: A | Discharge status: Home / Self Care | Payer: Self-pay | Source: Ambulatory Visit | Attending: Pulmonary Disease

## 2012-01-19 DIAGNOSIS — R9430 Abnormal result of cardiovascular function study, unspecified: Secondary | ICD-10-CM

## 2012-01-19 DIAGNOSIS — E872 Acidosis: Secondary | ICD-10-CM

## 2012-01-19 HISTORY — PX: LEFT HEART CATHETERIZATION WITH CORONARY ANGIOGRAM: SHX5451

## 2012-01-19 LAB — CBC
Hemoglobin: 12.8 g/dL — ABNORMAL LOW (ref 13.0–17.0)
MCH: 32 pg (ref 26.0–34.0)
MCHC: 33.9 g/dL (ref 30.0–36.0)
MCHC: 33.9 g/dL (ref 30.0–36.0)
MCV: 94.5 fL (ref 78.0–100.0)
MCV: 95.3 fL (ref 78.0–100.0)
Platelets: 445 10*3/uL — ABNORMAL HIGH (ref 150–400)
RDW: 13.6 % (ref 11.5–15.5)
WBC: 8.7 10*3/uL (ref 4.0–10.5)

## 2012-01-19 LAB — BASIC METABOLIC PANEL
Calcium: 9.2 mg/dL (ref 8.4–10.5)
Creatinine, Ser: 0.74 mg/dL (ref 0.50–1.35)
GFR calc non Af Amer: >90 mL/min (ref >90)
Glucose, Bld: 95 mg/dL (ref 70–99)
Sodium: 139 mEq/L (ref 135–145)

## 2012-01-19 LAB — CREATININE, SERUM
GFR calc Af Amer: >90 mL/min (ref >90)
GFR calc non Af Amer: >90 mL/min (ref >90)

## 2012-01-19 LAB — PROTIME-INR: Prothrombin Time: 20 seconds — ABNORMAL HIGH (ref 11.6–15.2)

## 2012-01-19 SURGERY — LEFT HEART CATHETERIZATION WITH CORONARY ANGIOGRAM
Anesthesia: LOCAL

## 2012-01-19 MED ORDER — FENTANYL CITRATE 0.05 MG/ML IJ SOLN
INTRAMUSCULAR | Status: AC
  Filled 2012-01-19: qty 2

## 2012-01-19 MED ORDER — MIDAZOLAM HCL 2 MG/2ML IJ SOLN
INTRAMUSCULAR | Status: AC
  Filled 2012-01-19: qty 2

## 2012-01-19 MED ORDER — ONDANSETRON HCL 4 MG/2ML IJ SOLN: 4.0000 mg | Freq: Four times a day (QID) | INTRAMUSCULAR | Status: DC | PRN

## 2012-01-19 MED ORDER — ACETAMINOPHEN 325 MG PO TABS: 650.0000 mg | ORAL_TABLET | ORAL | Status: DC | PRN

## 2012-01-19 MED ORDER — FOOD THICKENER (THICKENUP CLEAR): ORAL | Status: DC

## 2012-01-19 MED ORDER — HEPARIN SODIUM (PORCINE) 5000 UNIT/ML IJ SOLN
5000.0000 [IU] | Freq: Three times a day (TID) | INTRAMUSCULAR | Status: DC
  Filled 2012-01-19 (×2): qty 1

## 2012-01-19 MED ORDER — LIDOCAINE HCL (PF) 1 % IJ SOLN
INTRAMUSCULAR | Status: AC
  Filled 2012-01-19: qty 30

## 2012-01-19 MED ORDER — ASPIRIN 81 MG PO CHEW: 81.0000 mg | CHEWABLE_TABLET | Freq: Every day | ORAL | Status: AC

## 2012-01-19 MED ORDER — NITROGLYCERIN 0.2 MG/ML ON CALL CATH LAB
INTRAVENOUS | Status: AC
  Filled 2012-01-19: qty 1

## 2012-01-19 MED ORDER — METOPROLOL SUCCINATE ER 25 MG PO TB24: 25.0000 mg | ORAL_TABLET | Freq: Every day | ORAL | Status: DC

## 2012-01-19 MED ORDER — SODIUM CHLORIDE 0.9 % IV SOLN
INTRAVENOUS | Status: DC
  Administered 2012-01-19: 14:00:00 via INTRAVENOUS

## 2012-01-19 MED ORDER — HEPARIN (PORCINE) IN NACL 2-0.9 UNIT/ML-% IJ SOLN
INTRAMUSCULAR | Status: AC
  Filled 2012-01-19: qty 2000

## 2012-01-19 NOTE — Interval H&P Note (Signed)
History and Physical Interval Note:  01/19/2012 12:57 PM  Angel [name]  has presented today for surgery, with the diagnosis of chest pain  The various methods of treatment have been discussed with the patient and family. After consideration of risks, benefits and other options for treatment, the patient has consented to  Procedure(s) (LRB): LEFT HEART CATHETERIZATION WITH CORONARY ANGIOGRAM (N/A) as a surgical intervention .  The patients' history has been reviewed, patient examined, no change in status, stable for surgery.  I have reviewed the patients' chart and labs.  Questions were answered to the patient's satisfaction.     Desirey Keahey [employer]

## 2012-01-19 NOTE — H&P (View-Only) (Signed)
 PROGRESS NOTE  Subjective:   Pt is doing better.  Myoview yesterday revealed:  This study reveals scar at the base and mid inferior wall and the  base and mid inferoseptal wall. There is ischemia that is mild in  the anteroseptal segments and the base of the inferolateral wall.  There is currently a focal wall motion abnormality affecting the  septum.  Pt is pain free.  Will go for cath today.   Objective:    Vital Signs:   Temp:  [97.7 F (36.5 C)-99.5 F (37.5 C)] 97.8 F (36.6 C) (05/17 0654) Pulse Rate:  [67-88] 67  (05/17 0654) Resp:  [18-22] 20  (05/17 0654) BP: (109-118)/(70-77) 113/73 mmHg (05/17 0654) SpO2:  [98 %-99 %] 99 % (05/17 0654) Weight:  [130 lb (58.968 kg)-131 lb 6.3 oz (59.6 kg)] 131 lb 6.3 oz (59.6 kg) (05/17 0654)  Last BM Date: 01/18/12   24-hour weight change: Weight change:   Weight trends: Filed Weights   01/17/12 0607 01/18/12 0730 01/19/12 0654  Weight: 130 lb 3.2 oz (59.058 kg) 130 lb (58.968 kg) 131 lb 6.3 oz (59.6 kg)    Intake/Output:  05/16 0701 - 05/17 0700 In: 683 [P.O.:680; I.V.:3] Out: 0      Physical Exam: BP 113/73  Pulse 67  Temp(Src) 97.8 F (36.6 C) (Oral)  Resp 20  Ht 6' 2" (1.88 m)  Wt 131 lb 6.3 oz (59.6 kg)  BMI 16.87 kg/m2  SpO2 99%  General: Vital signs reviewed and noted. Well-developed, well-nourished, in no acute distress; alert, appropriate and cooperative throughout examination.  Head: Normocephalic, atraumatic.  Eyes: conjunctivae/corneas clear. PERRL, EOM's intact. Fundi benign.  Throat: Normal,s/p trach  Neck: Supple. Normal carotids. No JVD  Lungs:  Clear bilaterally to auscultation without wheezes, rales, or rhonchi. Breathing is unlabored.  Heart: Regular rate,  With normal  S1 S2. No murmurs, gallops or rubs  Abdomen:  Soft, non-tender, non-distended with normoactive bowel sounds. No hepatomegaly. No rebound/guarding. No abdominal masses.  Extremities: Distal pedal pulses are 2+ .  No edema.     Neurologic: A&O X3, CN II - XII are grossly intact. Motor strength is 5/5 in the all 4 extremities.  Psych: Responds to questions appropriately with normal affect.    Labs: BMET:  Basename 01/19/12 0545 01/17/12 0510  NA 139 140  K 4.0 4.0  CL 99 101  CO2 30 28  GLUCOSE 95 92  BUN 26* 25*  CREATININE 0.74 0.65  CALCIUM 9.2 9.0  MG -- --  PHOS -- --    Liver function tests: No results found for this basename: AST:2,ALT:2,ALKPHOS:2,BILITOT:2,PROT:2,ALBUMIN:2 in the last 72 hours No results found for this basename: LIPASE:2,AMYLASE:2 in the last 72 hours  CBC:  Basename 01/19/12 0545 01/18/12 0605  WBC 9.8 11.2*  NEUTROABS -- --  HGB 12.8* 12.8*  HCT 37.8* 38.1*  MCV 94.5 94.5  PLT 451* 428*    Cardiac Enzymes: No results found for this basename: CKTOTAL:4,CKMB:4,TROPONINI:4 in the last 72 hours  Coagulation Studies:  Basename 01/19/12 0545 01/18/12 0605 01/17/12 0510  LABPROT 20.0* 22.7* 24.9*  INR 1.67* 1.96* 2.21*    Other:    Tele:  NSR  Medications:    Infusions:    . sodium chloride 20 mL/hr (01/17/12 0611)  . sodium chloride 100 mL/hr at 01/19/12 0536    Scheduled Medications:    . aspirin  324 mg Oral Pre-Cath  . aspirin  81 mg Oral Daily  . feeding supplement    30 mL Per Tube BID  . metoprolol tartrate  25 mg Per Tube TID  . sodium chloride  3 mL Intravenous Q12H  . sodium chloride  3 mL Intravenous Q12H  . DISCONTD: antiseptic oral rinse  15 mL Mouth Rinse q12n4p  . DISCONTD: chlorhexidine  15 mL Mouth Rinse BID    Assessment/ Plan:    1. S/p cardiac arrest.  Due to multi - drug abuse - cocaine, THC. ETOH.  Abnormal myoview - will schedule for cath tomorrow.  Have discussed the risks, benefits, options.  He understands and agrees to proceed   Disposition: cath later today. He will probably be able to go home soon.  I will follow up as OP  Length of Stay: 26  Christee Mervine J. Teri Diltz, Jr., MD, FACC 01/19/2012, 7:20 AM Office  [phone] Pager [phone]    

## 2012-01-19 NOTE — Progress Notes (Signed)
Pt arrived back to unit from cath lab. Pt VS taken. BP 113/76 HR 60. Pt advised about bedrest and not placing HOB >30degrees until 1755. Pt states understanding. Pt does not appear in any immediate distress. Will continue to monitor.

## 2012-01-19 NOTE — Progress Notes (Signed)
Name: Angel [name] DOB: [birth year] MRN: [id number] PCP: No primary provider on file. 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 His family states that he abuses some perscription drugs and some occasional alcohol. Smokes 2 PPD history. Daughter tried to call him 4/21 all day but he was sleeping which is unusual for him. His friend found him shaking violently around 8 PM and called 911 who intubated in the field for agonal breathing. He was unresponsive at the time. He was brought to the ED 4/21 and placed on hypothermia protocol. No ischemic EKG changes on ED arrival. UDS positive for cocaine and THC.  He subsequently made a dramatic recovery neurologically but remains trach/ft dep.   Lines / Drains: ETT 4/21>>> 5/2 CVC 4/21>>> out Janina Mayo Molli Knock) 5/2>>>5/15  Micro: Blood 4/21>>>HAEMOPHILUS INFLUENZAE Urine 4/21>>>Neg  Sputum 4/21>>>GNR and GPC - NORMAL FLORA BC 4/27>>>neg BAL 4/27>>>neg Urine 4/27>>>neg BAL 4/30>>>neg  Abx Vancomycin 4/21>5/1 Primaxin 4/24> 5/7  SUBJ:   Vital Signs: Temp:  [97.7 F (36.5 C)-99.5 F (37.5 C)] 97.8 F (36.6 C) (05/17 0654) Pulse Rate:  [67-88] 67  (05/17 0654) Resp:  [18-22] 20  (05/17 0654) BP: (109-118)/(70-77) 113/73 mmHg (05/17 0654) SpO2:  [98 %-99 %] 99 % (05/17 0654) Weight:  [131 lb 6.3 oz (59.6 kg)] 131 lb 6.3 oz (59.6 kg) (05/17 0654) I/O last 3 completed shifts: In: 683 [P.O.:680; I.V.:3] Out: 0   Intake/Output Summary (Last 24 hours)  at 01/19/12 1024 Last data filed at 01/19/12 0858  Gross per 24 hour  Intake    483 ml  Output      0 ml  Net    483 ml   Physical Examination:  Gen: walking around in room, joking with family HEENT: NCAT, trach out, dressing in place over stoma PULM; CTA B CV: RRR, no mgr AB: BS+, soft, nontender Ext: warm, well perfused Neuro: A&OX4, non focal  Labs/Radiology:  CBC  Lab 01/19/12 0545 01/18/12 0605 01/17/12 0510  HGB 12.8* 12.8* 13.3  HCT 37.8* 38.1* 39.3  WBC 9.8 11.2* 15.0*  PLT 451* 428* 474*     BMET  Lab 01/19/12 0545 01/17/12 0510 01/16/12 0500 01/13/12 0820  NA 139 140 137 142  K 4.0 4.0 -- --  CL 99 101 100 102  CO2 30 28 27 27   GLUCOSE 95 92 103* 122*  BUN 26* 25* 34* 34*  CREATININE 0.74 0.65 0.62 0.63  CALCIUM 9.2 9.0 8.9 9.5  MG -- -- -- --  PHOS -- -- -- --      ASSESSMENT AND PLAN 46 year old male with PMH of cocaine, etoh and drug abuse presenting after being found down in a hotel room positive for cocaine and THC. Patient was noted to have vomitus into his airway upon intubation. RUL and RML infiltrate noted. Completed  hypothermia and ARDS protocol and now doing dramatically better.    Neuro:  Post anoxic encephalopathy - Resolved.  Plan:  -Cont supportive care -PT/OT consult -no indication for SNF, plan for d/c home with daughter  Cardiac:  s/p cardiac arrest.  Shock resolved.  5/15 Myoview with scar in base, mid inf  wall; ant-septal ischeamia Plan:  -Contine baby ASA, tele -LHC 5/17  Pulmonary:  VDRF resolved; Decannulated 5/15 Persistent infiltrate R lung appears to represent prior pneumonia but needs to be followed up as outpatient Plan: -CXR with rounded opacity on R -needs outpatient CXR to ensure resolution of infiltrate three weeks after discharge  GI:  Protein calorie malnutrition Dysphagia  Plan:  -reg diet with D3, nectar thick liquid  ID:  PNA, likely aspiration.  H flu in BAL.  Completed 14 days  abx Plan:  -Monitor off abx -resolving WBC 5/17  Heme:  Cephalic vein clot (NOT DVT) in bilateral upper extremity.  Has been anticoagulated for the same with wafarin.  No indication for warfarin in this setting.   Thrombocytopenia resolved.  Neg HITT panel. Plan:  -off coumadin  Endo:  Hyperglycemia - very mild. No hx of DM Plan:  -Resolved.    Dispo: -LHC 5/17, dispo pending results  Canary Brim, NP-C [facility] Pgr: [phone] or [phone]  I have seen and examined the patient with nurse practitioner/resident and agree with and have edited the note above.   Yolonda Kida PCCM Pager: [phone] Cell: [phone] If no response, call [phone]

## 2012-01-19 NOTE — CV Procedure (Signed)
   Cardiac Catheterization Procedure Note  Name: Angel [name] MRN: [id number] DOB: 1966-01-26  Procedure: Left Heart Cath, Selective Coronary Angiography, LV angiography  Indication: Abnormal myoview, h/o elevated troponin.    Procedural details: Allen's test on right wrist was negative so we used femoral access.  The right groin was prepped, draped, and anesthetized with 1% lidocaine. Using modified Seldinger technique, a 5 French sheath was introduced into the right femoral artery. Standard Judkins catheters were used for coronary angiography and left ventriculography. Catheter exchanges were performed over a guidewire. There were no immediate procedural complications. The patient was transferred to the post catheterization recovery area for further monitoring.  Procedural Findings: Hemodynamics:  AO 110/72 LV 114/9   Coronary angiography: Coronary dominance: right  Left mainstem: No angiographic CAD.  Left anterior descending (LAD): Mild luminal irregularities.   Left circumflex (LCx): No angiographic CAD.   Right coronary artery (RCA): No angiographic CAD.  There was a high acute marginal that could be selectively engaged.   Left ventriculography: Left ventricular systolic function is normal, LVEF is estimated at 60-65%, there is no significant mitral regurgitation.  No regional wall motion abnormalities.   Final Conclusions:  No significant CAD.  Normal LV systolic function.  [name] 01/19/2012, 1:27 PM

## 2012-01-19 NOTE — Progress Notes (Signed)
This CSW received a voicemail message from pt's mother requesting assistance with disability paperwork.  CSW relayed this message to current RNCM who is working with this pt; Selena Batten.  No other needs identified at this time.   Angelia Mould, MSW, [place] [phone]

## 2012-01-19 NOTE — Progress Notes (Signed)
PROGRESS NOTE  Subjective:   Pt is doing better.  Myoview yesterday revealed:  This study reveals scar at the base and mid inferior wall and the  base and mid inferoseptal wall. There is ischemia that is mild in  the anteroseptal segments and the base of the inferolateral wall.  There is currently a focal wall motion abnormality affecting the  septum.  Pt is pain free.  Will go for cath today.   Objective:    Vital Signs:   Temp:  [97.7 F (36.5 C)-99.5 F (37.5 C)] 97.8 F (36.6 C) (05/17 0654) Pulse Rate:  [67-88] 67  (05/17 0654) Resp:  [18-22] 20  (05/17 0654) BP: (109-118)/(70-77) 113/73 mmHg (05/17 0654) SpO2:  [98 %-99 %] 99 % (05/17 0654) Weight:  [130 lb (58.968 kg)-131 lb 6.3 oz (59.6 kg)] 131 lb 6.3 oz (59.6 kg) (05/17 0654)  Last BM Date: 02/16/2012   24-hour weight change: Weight change:   Weight trends: Filed Weights   01/17/12 0607 2012/02/16 0730 01/19/12 0654  Weight: 130 lb 3.2 oz (59.058 kg) 130 lb (58.968 kg) 131 lb 6.3 oz (59.6 kg)    Intake/Output:  02/16/2023 0701 - 05/17 0700 In: 683 [P.O.:680; I.V.:3] Out: 0      Physical Exam: BP 113/73  Pulse 67  Temp(Src) 97.8 F (36.6 C) (Oral)  Resp 20  Ht 6\' 2"  (1.88 m)  Wt 131 lb 6.3 oz (59.6 kg)  BMI 16.87 kg/m2  SpO2 99%  General: Vital signs reviewed and noted. Well-developed, well-nourished, in no acute distress; alert, appropriate and cooperative throughout examination.  Head: Normocephalic, atraumatic.  Eyes: conjunctivae/corneas clear. PERRL, EOM's intact. Fundi benign.  Throat: Normal,s/p trach  Neck: Supple. Normal carotids. No JVD  Lungs:  Clear bilaterally to auscultation without wheezes, rales, or rhonchi. Breathing is unlabored.  Heart: Regular rate,  With normal  S1 S2. No murmurs, gallops or rubs  Abdomen:  Soft, non-tender, non-distended with normoactive bowel sounds. No hepatomegaly. No rebound/guarding. No abdominal masses.  Extremities: Distal pedal pulses are 2+ .  No edema.     Neurologic: A&O X3, CN II - XII are grossly intact. Motor strength is 5/5 in the all 4 extremities.  Psych: Responds to questions appropriately with normal affect.    Labs: BMET:  Basename 01/19/12 0545 01/17/12 0510  NA 139 140  K 4.0 4.0  CL 99 101  CO2 30 28  GLUCOSE 95 92  BUN 26* 25*  CREATININE 0.74 0.65  CALCIUM 9.2 9.0  MG -- --  PHOS -- --    Liver function tests: No results found for this basename: AST:2,ALT:2,ALKPHOS:2,BILITOT:2,PROT:2,ALBUMIN:2 in the last 72 hours No results found for this basename: LIPASE:2,AMYLASE:2 in the last 72 hours  CBC:  Basename 01/19/12 0545 02-16-12 0605  WBC 9.8 11.2*  NEUTROABS -- --  HGB 12.8* 12.8*  HCT 37.8* 38.1*  MCV 94.5 94.5  PLT 451* 428*    Cardiac Enzymes: No results found for this basename: CKTOTAL:4,CKMB:4,TROPONINI:4 in the last 72 hours  Coagulation Studies:  Basename 01/19/12 0545 February 16, 2012 0605 01/17/12 0510  LABPROT 20.0* 22.7* 24.9*  INR 1.67* 1.96* 2.21*    Other:    Tele:  NSR  Medications:    Infusions:    . sodium chloride 20 mL/hr (01/17/12 1610)  . sodium chloride 100 mL/hr at 01/19/12 0536    Scheduled Medications:    . aspirin  324 mg Oral Pre-Cath  . aspirin  81 mg Oral Daily  . feeding supplement  30 mL Per Tube BID  . metoprolol tartrate  25 mg Per Tube TID  . sodium chloride  3 mL Intravenous Q12H  . sodium chloride  3 mL Intravenous Q12H  . DISCONTD: antiseptic oral rinse  15 mL Mouth Rinse q12n4p  . DISCONTD: chlorhexidine  15 mL Mouth Rinse BID    Assessment/ Plan:    1. S/p cardiac arrest.  Due to multi - drug abuse - cocaine, THC. ETOH.  Abnormal myoview - will schedule for cath tomorrow.  Have discussed the risks, benefits, options.  He understands and agrees to proceed   Disposition: cath later today. He will probably be able to go home soon.  I will follow up as OP  Length of Stay: 39  Vesta Mixer, Montez Hageman., MD, [facility] 01/19/2012, 7:20 AM Office  [phone] Pager [phone]

## 2012-01-19 NOTE — Discharge Instructions (Signed)
thicken your liquids to nectar thick until instructed otherwise.   You have a follow up swallowing study at [facility] on Friday May 24th. Please report to the radiology department on 1st floor at 1045 am.  You can call [phone] if you have questions.

## 2012-01-22 ENCOUNTER — Other Ambulatory Visit (HOSPITAL_COMMUNITY): Payer: Self-pay | Admitting: Pulmonary Disease

## 2012-01-22 ENCOUNTER — Encounter: Payer: Self-pay | Admitting: *Deleted

## 2012-01-24 LAB — CULTURE, BLOOD (ROUTINE X 2)

## 2012-01-26 ENCOUNTER — Ambulatory Visit (HOSPITAL_COMMUNITY)
Admission: RE | Admit: 2012-01-26 | Discharge: 2012-01-26 | Disposition: A | Discharge status: Home / Self Care | Payer: 59 | Source: Ambulatory Visit | Attending: Pulmonary Disease | Admitting: Pulmonary Disease

## 2012-01-26 ENCOUNTER — Ambulatory Visit (HOSPITAL_COMMUNITY)
Admission: RE | Admit: 2012-01-26 | Discharge: 2012-01-26 | Disposition: A | Discharge status: Home / Self Care | Payer: Self-pay | Source: Ambulatory Visit | Attending: Pulmonary Disease | Admitting: Pulmonary Disease

## 2012-01-26 DIAGNOSIS — R1313 Dysphagia, pharyngeal phase: Secondary | ICD-10-CM | POA: Insufficient documentation

## 2012-01-26 DIAGNOSIS — R1310 Dysphagia, unspecified: Secondary | ICD-10-CM | POA: Insufficient documentation

## 2012-01-26 NOTE — Procedures (Signed)
Objective Swallowing Evaluation: Modified Barium Swallowing Study  Patient Details  Name: Angel [name] MRN: [id number] Date of Birth: 02/28/1966  Today's Date: 01/26/2012 Time: 1100-1130 SLP Time Calculation (min): 30 min  Past Medical History:  Past Medical History  Diagnosis Date  . History of cardiac arrest    Past Surgical History:  Past Surgical History  Procedure Date  . Cardiac catheterization     left heart with angiogram   HPI:  46 yr old male referred for a MBSS after recent hospitalization after cardiac arrest, + cocaine, likely drug overdose, intubated 4/21-5/2, trach 5/2 with decannulation before D/C, and MBSS prior to D/C revealed silent aspiration with thin liquids.  MBSS today to determine safety with thin liquids, as patient has not complied with nectar-thick liquid recommendations.     Assessment / Plan / Recommendation Clinical Impression  Dysphagia Diagnosis: Mild pharyngeal phase dysphagia Clinical impression: Grossly functional oral-pharyngeal swallow with all tested consistencies with only mild premature loss of oral thin liquid bolus to the level of the pyriform sinsuses before the swallow trigger.  This was inconsequential except with sequential, consecutive swallows which resulted in trace silent laryngeal penetration.  Given that the patient has been consuming thin liquids since discharge without any pulmonary complications, will continue regular textures and thin liquids.      Treatment Recommendation  No treatment recommended at this time    Diet Recommendation Regular;Thin liquid   Other  Recommendations     Follow Up Recommendations  None    Pertinent Vitals/Pain n/a     Reason for Referral Objectively assess swallow function  Pharyngeal Phase Pharyngeal Phase: Impaired   Cervical Esophageal Phase Cervical Esophageal Phase: [facility]    Myra Rude, M.S.,CCC-SLP Pager [phone] 01/26/2012, 1:17 PM

## 2012-01-31 LAB — FUNGUS CULTURE W SMEAR: Fungal Smear: NONE SEEN

## 2012-02-01 ENCOUNTER — Encounter: Payer: Self-pay | Admitting: *Deleted

## 2012-02-02 ENCOUNTER — Ambulatory Visit (INDEPENDENT_AMBULATORY_CARE_PROVIDER_SITE_OTHER): Payer: Self-pay | Admitting: Nurse Practitioner

## 2012-02-02 ENCOUNTER — Encounter: Payer: Self-pay | Admitting: Nurse Practitioner

## 2012-02-02 VITALS — BP 131/84 | HR 74 | Ht 74.0 in | Wt 151.0 lb

## 2012-02-02 DIAGNOSIS — Z8709 Personal history of other diseases of the respiratory system: Secondary | ICD-10-CM | POA: Insufficient documentation

## 2012-02-02 DIAGNOSIS — Z8674 Personal history of sudden cardiac arrest: Secondary | ICD-10-CM

## 2012-02-02 DIAGNOSIS — R918 Other nonspecific abnormal finding of lung field: Secondary | ICD-10-CM | POA: Insufficient documentation

## 2012-02-02 DIAGNOSIS — J690 Pneumonitis due to inhalation of food and vomit: Secondary | ICD-10-CM | POA: Insufficient documentation

## 2012-02-02 DIAGNOSIS — Z8619 Personal history of other infectious and parasitic diseases: Secondary | ICD-10-CM

## 2012-02-02 DIAGNOSIS — F1910 Other psychoactive substance abuse, uncomplicated: Secondary | ICD-10-CM

## 2012-02-02 DIAGNOSIS — I428 Other cardiomyopathies: Secondary | ICD-10-CM | POA: Insufficient documentation

## 2012-02-02 DIAGNOSIS — Z87898 Personal history of other specified conditions: Secondary | ICD-10-CM | POA: Insufficient documentation

## 2012-02-02 NOTE — Progress Notes (Signed)
Patient Name: Angel [name] Date of Encounter: 02/02/2012  Primary Care Provider:  Max Fickle, MD, MD Primary Cardiologist:  Katherina Right, MD  Patient Profile  46 y/o male with below PMH who presents for hospital f/u.  Problem List   Past Medical History  Diagnosis Date  . History of cardiac arrest     a. 12/2011 PEA arrest in setting of drug/etoh use  . Nonischemic cardiomyopathy     a. 12/2011 Echo: EF 20-25% (in setting of PEA arrest);  b. 01/2012 f/u Echo: EF 60-65%, 01/2012 Myoview: EF 58%, inf/infsept scar w/ anteroseptal/inflat ischemia;  d. 01/2012 Cath: NL Cors, EF 60-65%  . Aspiration pneumonia     a. 12/2011 in setting of PEA arrest  . Polysubstance abuse     a. 12/2011 UDS + for cocaine, THC  . History of respiratory failure     a. 12/2011 in setting of PEA  . History of bacteremia     a. G. coccobacilli 12/2011  . Anoxic encephalopathy     a. 12/2011  . Pulmonary infiltrate in right lung on chest x-ray     a. 01/2012  w/ rec for outpt f/u cxr   Past Surgical History  Procedure Date  . Cardiac catheterization 5/13    left heart with angiogram    Allergies  No Known Allergies  HPI  46 y/o male with the above problem list.  He was hospitalized in April of this year with PEA arrest and resp failure in the setting of cocaine/marijuana usage.  Hospital course was complicated by asp pna, bacteremia, elevated cardiac markers, and anoxic encephalopathy.  He had an echo following admission showing depressed LV function (during hypothermia protocol) and our team was involved.  F/U echo roughly 2 wks later showed normalized LV function.  B/c of elevated CE, a myoview was undertaken and was abnl as above.  Subsequent cath showed normal cors.  He has been medically managed and at this point is only on asa.  It looks like he was on a bb but he says he only had 6 pills to take following d/c.  Since d/c, he has been steadily getting stronger and resuming his normal activities.  He  denies having c/p or dyspnea.  He says he no longer smokes, drinks, or abuses drugs.  Home Medications  Prior to Admission medications   Medication Sig Start Date End Date Taking? Authorizing Provider  aspirin 81 MG chewable tablet Chew 1 tablet (81 mg total) by mouth daily. 01/19/12 01/18/13 Yes Simonne Martinet, NP  calcium carbonate (OS-CAL) 600 MG TABS Take 600 mg by mouth 2 (two) times daily with a meal.   Yes Historical Provider, MD  Multiple Vitamin (MULITIVITAMIN WITH MINERALS) TABS Take 1 tablet by mouth daily.   Yes Historical Provider, MD   Review of Systems No chest pain, sob.  Gradually feeling better/stronger.  All other systems reviewed and are otherwise negative except as noted above.  Physical Exam  Blood pressure 131/84, pulse 74, height 6\' 2"  (1.88 m), weight 151 lb (68.493 kg).  General: Pleasant, NAD Psych: Normal affect. Neuro: Alert and oriented X 3. Moves all extremities spontaneously. HEENT: Normal  Neck: Supple without bruits or JVD. Lungs:  Resp regular and unlabored, CTA. Heart: RRR no s3, s4, or murmurs. Abdomen: Soft, non-tender, non-distended, BS + x 4.  Extremities: No clubbing, cyanosis or edema. DP/PT/Radials 2+ and equal bilaterally.  Assessment & Plan  1.  H/O NICM:  Pt with reduced  EF in setting of PEA arrest with normalized LV function and normal cors on cath.  ? Role of coronary vasospasm in setting of cocaine abuse.  Pt says that he is no longer using drugs for which he was congratulated.  He was d/c on toprol, but says he only had this for 6 days following d/c.  I'd be reluctant to resume given cocaine use history.  No further cardiac w/u is planned @ this time.  2.  Resp Failure & persistent R Lung infiltrate:  He has f/u with pulmonology next week with plan for repeat cxr @ that time according to d/c summary.  3.  Polysubstance abuse:  Continued cessation advised.  4.  Dispo:  F/u prn.  Nicolasa Ducking, NP 02/02/2012, 4:04 PM

## 2012-02-02 NOTE — Patient Instructions (Signed)
Your physician recommends that you schedule a follow-up appointment as needed  

## 2012-02-03 DEATH — deceased

## 2012-02-07 ENCOUNTER — Ambulatory Visit (INDEPENDENT_AMBULATORY_CARE_PROVIDER_SITE_OTHER): Payer: Self-pay | Admitting: Adult Health

## 2012-02-07 ENCOUNTER — Ambulatory Visit (INDEPENDENT_AMBULATORY_CARE_PROVIDER_SITE_OTHER)
Admission: RE | Admit: 2012-02-07 | Discharge: 2012-02-07 | Disposition: A | Discharge status: Home / Self Care | Payer: Self-pay | Source: Ambulatory Visit | Attending: Adult Health | Admitting: Adult Health

## 2012-02-07 VITALS — BP 110/72 | HR 80 | Temp 96.4°F | Ht 74.0 in | Wt 155.6 lb

## 2012-02-07 DIAGNOSIS — J189 Pneumonia, unspecified organism: Secondary | ICD-10-CM

## 2012-02-07 DIAGNOSIS — Z8674 Personal history of sudden cardiac arrest: Secondary | ICD-10-CM

## 2012-02-07 DIAGNOSIS — R918 Other nonspecific abnormal finding of lung field: Secondary | ICD-10-CM

## 2012-02-07 DIAGNOSIS — J690 Pneumonitis due to inhalation of food and vomit: Secondary | ICD-10-CM

## 2012-02-07 DIAGNOSIS — I428 Other cardiomyopathies: Secondary | ICD-10-CM

## 2012-02-07 DIAGNOSIS — R222 Localized swelling, mass and lump, trunk: Secondary | ICD-10-CM

## 2012-02-07 DIAGNOSIS — Z8709 Personal history of other diseases of the respiratory system: Secondary | ICD-10-CM

## 2012-02-07 DIAGNOSIS — G931 Anoxic brain damage, not elsewhere classified: Secondary | ICD-10-CM

## 2012-02-07 DIAGNOSIS — F1910 Other psychoactive substance abuse, uncomplicated: Secondary | ICD-10-CM

## 2012-02-07 MED ORDER — IOHEXOL 300 MG/ML  SOLN
80.0000 mL | Freq: Once | INTRAMUSCULAR | Status: AC | PRN
  Administered 2012-02-07: 80 mL via INTRAVENOUS

## 2012-02-07 NOTE — Assessment & Plan Note (Signed)
Offered OP counseling referral , pt declined

## 2012-02-07 NOTE — Progress Notes (Signed)
Subjective:    Patient ID: Angel [name], male    DOB: July 04, 1966, 46 y.o.   MRN: [id number]  HPI 46 yo male, history of smoking and polysubstance abuse admitted by pulmonary and critical care medicine on December 24, 2011 for shock, acute respiratory failure, pneumonia, status post cardiac arrest . He had a prolonged hospitalization complicated by tracheostomy, post anoxic encephalopathy and dysphagia  02/07/2012 Post hospital follow-up Patient had a prolonged hospitalization. April 22 through 01/19/2012 after cardiac arrest with subsequent vent dependent respiratory failure, pneumonia, post anoxic encephalopathy, dysphagia, and bilateral superficial, upper extremity thrombi. Patient was found by friends unresponsive, EMS found pt in  PEA arrest, he was thusly started on hypothermia protocol. He was positive for cocaine, marijuana. He has an extensive history of smoking. He required tracheostomy. Due to failure of weaning off the ventilator. He required prolonged antibiotics. Due to suspected aspiration pneumonia. Blood cultures were positive for Haemophilus influenza. Patient initially had some delirium, but this slowly improved, along his hospital stay. He required physical therapy and occupational therapy for deconditioning and dysphagia. Was D. cannulated on may 15 initial echo showed an ejection fracture 2025% with diffuse hypokinesis. And a grade 1 diastolic dysfunction. Patient was evaluated by cardiology. He underwent a Myoview on May 15 that showed a scar at the base and mid inferior wall. On May 6 showed echo  ejection fracture at 60-65%. Cath showed no sign. CAD   Since discharge. Patient is feeling improved with decreased cough, congestion, and shortness of breath. He does continue to be somewhat weak, but feels that he is getting stronger each day. He has not restarted smoking or using drugs. We discussed outpatient counseling however, he declined. He "does not feel that he has a real  problem." He denies any hemoptysis, chest pain, orthopnea, PND, or leg swelling Chest x-ray today shows a persistent right midlung D. Left lung is clear   Review of Systems Constitutional:   No  weight loss, night sweats,  Fevers, chills, + fatigue, or  lassitude.  HEENT:   No headaches,  Difficulty swallowing,  Tooth/dental problems, or  Sore throat,                No sneezing, itching, ear ache, nasal congestion, post nasal drip,   CV:  No chest pain,  Orthopnea, PND, swelling in lower extremities, anasarca, dizziness, palpitations, syncope.   GI  No heartburn, indigestion, abdominal pain, nausea, vomiting, diarrhea, change in bowel habits, loss of appetite, bloody stools.   Resp:   No excess mucus, no productive cough,  No non-productive cough,  No coughing up of blood.  No change in color of mucus.  No wheezing.  No chest wall deformity  Skin: no rash or lesions.  GU: no dysuria, change in color of urine, no urgency or frequency.  No flank pain, no hematuria   MS:  No joint pain or swelling.  No decreased range of motion.  No back pain.  Psych:  No change in mood or affect. No depression or anxiety.  No memory loss.         Objective:   Physical Exam GEN: A/Ox3; pleasant , NAD thin   HEENT:  [place]/AT,  EACs-clear, TMs-wnl, NOSE-clear, THROAT-clear, no lesions, no postnasal drip or exudate noted.   NECK:  Supple w/ fair ROM; no JVD; normal carotid impulses w/o bruits; no thyromegaly or nodules palpated; no lymphadenopathy.  RESP  Coarse BS  w/o, wheezes/ rales/ or rhonchi.no accessory muscle use, no  dullness to percussion  CARD:  RRR, no m/r/g  , no peripheral edema, pulses intact, no cyanosis or clubbing.  GI:   Soft & nt; nml bowel sounds; no organomegaly or masses detected.  Musco: Warm bil, no deformities or joint swelling noted.   Neuro: alert, no focal deficits noted.    Skin: Warm, no lesions or rashes '       Assessment & Plan:

## 2012-02-07 NOTE — Assessment & Plan Note (Signed)
Resolved

## 2012-02-07 NOTE — Assessment & Plan Note (Signed)
Prolonged hospitalization post cardiac arrest  W/+ cocaine on board.  Suspected aspiration PNA , clinically improved with prolonged abx  Persistent right mid lung opacity   Will set up for CT chest  No smoking  follow up 4 weeks in pulm clinic

## 2012-02-07 NOTE — Progress Notes (Signed)
Addended by: Abigail Miyamoto D on: 02/07/2012 10:55 AM   Modules accepted: Orders

## 2012-02-07 NOTE — Assessment & Plan Note (Signed)
Will need follow up with cards

## 2012-02-07 NOTE — Patient Instructions (Signed)
We are setting you up for a CT chest scan .  We are proud of you for quitting smoking  Call back if you change your mind on referral for counseling.  Please contact office for sooner follow up if symptoms do not improve or worsen or seek emergency care  Follow up with pulmonary doctor (first available )  in 3-4 weeks and As needed

## 2012-02-09 ENCOUNTER — Other Ambulatory Visit: Payer: Self-pay | Admitting: Pulmonary Disease

## 2012-02-09 MED ORDER — LEVOFLOXACIN 500 MG PO TABS: ORAL_TABLET | ORAL | Status: DC

## 2012-02-12 ENCOUNTER — Institutional Professional Consult (permissible substitution): Payer: Self-pay | Admitting: Cardiovascular Disease

## 2012-02-14 LAB — AFB CULTURE WITH SMEAR (NOT AT ARMC)

## 2012-02-28 ENCOUNTER — Ambulatory Visit: Payer: Self-pay | Admitting: Internal Medicine

## 2012-03-15 ENCOUNTER — Encounter: Payer: Self-pay | Admitting: Internal Medicine

## 2012-03-15 ENCOUNTER — Ambulatory Visit (INDEPENDENT_AMBULATORY_CARE_PROVIDER_SITE_OTHER)
Admission: RE | Admit: 2012-03-15 | Discharge: 2012-03-15 | Disposition: A | Discharge status: Home / Self Care | Payer: Self-pay | Source: Ambulatory Visit | Attending: Internal Medicine | Admitting: Internal Medicine

## 2012-03-15 ENCOUNTER — Ambulatory Visit (INDEPENDENT_AMBULATORY_CARE_PROVIDER_SITE_OTHER): Payer: Self-pay | Admitting: Internal Medicine

## 2012-03-15 ENCOUNTER — Telehealth: Payer: Self-pay | Admitting: *Deleted

## 2012-03-15 VITALS — BP 108/80 | HR 80 | Temp 98.2°F | Ht 74.0 in | Wt 164.2 lb

## 2012-03-15 DIAGNOSIS — F17200 Nicotine dependence, unspecified, uncomplicated: Secondary | ICD-10-CM

## 2012-03-15 DIAGNOSIS — R0989 Other specified symptoms and signs involving the circulatory and respiratory systems: Secondary | ICD-10-CM

## 2012-03-15 DIAGNOSIS — R0600 Dyspnea, unspecified: Secondary | ICD-10-CM

## 2012-03-15 DIAGNOSIS — R918 Other nonspecific abnormal finding of lung field: Secondary | ICD-10-CM

## 2012-03-15 DIAGNOSIS — R222 Localized swelling, mass and lump, trunk: Secondary | ICD-10-CM

## 2012-03-15 NOTE — Assessment & Plan Note (Signed)
#  Smoking  - even e cigs is bad; work on quitting them   3 min face to face counseling

## 2012-03-15 NOTE — Telephone Encounter (Signed)
He should have a CT chest with contrast (ordered) in 1- 2 weeks; I will follow result from vacation and advise if PET scan neede dor not

## 2012-03-15 NOTE — Telephone Encounter (Signed)
Received call report from [facility] radiology for pt CXR: R lung mass like opacity. Slight decreased since 02/07/12. While this might represent infection w/ incomplete response--Agree w/ recs on prior for repeat Chest CT this month w/ IV contrast preferred. Report is being faxed to triage. Please advise MR, thanks

## 2012-03-15 NOTE — Progress Notes (Signed)
Subjective:    Patient ID: Angel [name], male    DOB: 12-24-1965, 46 y.o.   MRN: [id number]  HPI 46 yo male, history of smoking and polysubstance abuse admitted by pulmonary and critical care medicine on December 24, 2011 for shock, acute respiratory failure, pneumonia, status post cardiac arrest . He had a prolonged hospitalization complicated by tracheostomy, post anoxic encephalopathy and dysphagia  02/07/2012 Post hospital follow-up Patient had a prolonged hospitalization. April 22 through 01/19/2012 after cardiac arrest with subsequent vent dependent respiratory failure, pneumonia, post anoxic encephalopathy, dysphagia, and bilateral superficial, upper extremity thrombi. Patient was found by friends unresponsive, EMS found pt in  PEA arrest, he was thusly started on hypothermia protocol. He was positive for cocaine, marijuana. He has an extensive history of smoking. He required tracheostomy. Due to failure of weaning off the ventilator. He required prolonged antibiotics. Due to suspected aspiration pneumonia. Blood cultures were positive for Haemophilus influenza. Patient initially had some delirium, but this slowly improved, along his hospital stay. He required physical therapy and occupational therapy for deconditioning and dysphagia. Was D. cannulated on may 15 initial echo showed an ejection fracture 2025% with diffuse hypokinesis. And a grade 1 diastolic dysfunction. Patient was evaluated by cardiology. He underwent a Myoview on May 15 that showed a scar at the base and mid inferior wall. On May 6 showed echo  ejection fracture at 60-65%. Cath showed no sign. CAD   Since discharge. Patient is feeling improved with decreased cough, congestion, and shortness of breath. He does continue to be somewhat weak, but feels that he is getting stronger each day. He has not restarted smoking or using drugs. We discussed outpatient counseling however, he declined. He "does not feel that he has a real  problem." He denies any hemoptysis, chest pain, orthopnea, PND, or leg swelling Chest x-ray today shows a persistent right midlung D. Left lung is clear   REC We are setting you up for a CT chest scan .  We are proud of you for quitting smoking  Call back if you change your mind on referral for counseling.  Please contact office for sooner follow up if symptoms do not improve or worsen or seek emergency care  Follow up with pulmonary doctor (first available )  in 3-4 weeks and As needed    OV 03/15/2012  FU    - PEA cardiac arrest April - May 2013 following H Flu pneumonia and  Bacteremia   - Smoking hx   - RLL superior segment nodular/mass like opacity  - developed new in hospital 01/09/12 to 01/19/12, persists on CT chest and cxr 02/07/12    PResents with mom for all of above. Says post cardiac arrest and discharge from hospital still having dyspnea with exertion. Improved initially but then plateaued. Dyspneic for groceries, walking stairs. Dyspnea relieved by rest. Seveity is rated as moderate. No associated chest pain. There eis assoicated cough but is mild and dry. Denies edema, orthopnea, pnd, hemoptysis, weight loss,fver  In terms of smoking and methadone abuse: quit regular cigs. Not doing methadone anymore. Doing e cigs. Does not know e cigs can be bad too  In terms of RLL sup segment mass   - it is persistent on CT chest 02/07/12 (which is 3- 4 weeks since first seen in hospital)  CT chest 02/07/12 IMPRESSION:  1. Mass-like opacity within the superior segment right lower lobe,  corresponding to the plain film abnormality. Surrounding  interstitial/reticular nodular opacity. Similar to 01/15/2012.  As  this was not well visualized on more remote exams, including  01/05/2012, an infectious or inflammatory etiology is suspected.  Neoplasm cannot be excluded. Potential clinical strategies include  CT follow-up at 4 - 6 weeks after appropriate antibiotic therapy  versus further  evaluation with PET. Consultation with the [facility] [phone]) should be  considered.  2. Moderate centrilobular emphysema with underlying ill-defined  small pulmonary nodules. Favored to represent infection, including  atypical etiologies. Pulmonary Langerhan's cell histiocytosis  would be an alternate explanation. Consider follow-up with chest  CT at 6 months to confirm stability or resolution of these areas.  Original Report Authenticated By: Consuello Bossier, M.D.      CXR today 03/15/2012 /Dg Chest 2 View  03/15/2012  *RADIOLOGY REPORT*  Clinical Data: 46 year old male with lung mass.  CHEST - 2 VIEW  Comparison: 02/07/2012 and earlier.  Findings: Mass-like opacity projecting in the right mid lung on the frontal views has regressed but not completely resolved (previously up to 32 mm diameter radiographically, now 27 mm).  As before this is poorly visible on the lateral view, it is posteriorly situated. Right hilar contours remain within normal limits. Other mediastinal contours are within normal limits.  Visualized tracheal air column is within normal limits.  No pneumothorax, pulmonary edema, pleural effusion or new pulmonary opacity. No acute osseous abnormality identified.  IMPRESSION: 1.  Right lung mass-like opacity has slightly decreased radiographically since 02/07/2012. While this might represent infection with incomplete response, at this point I agree with the recommendation on the prior for a repeat chest CT this month (IV contrast preferred). 2.  No new cardiopulmonary abnormality.  These results will be called to the ordering clinician or representative by the Radiologist Assistant, and communication documented in the PACS Dashboard.  Original Report Authenticated By: Harley Hallmark, M.D.        Past, Family, Social reviewed:unemplyed. Living with mom. Supported $ by [employer]  Constitutional: Negative for fever and  unexpected weight change.  HENT: Negative for ear pain, nosebleeds, congestion, sore throat, rhinorrhea, sneezing, trouble swallowing, dental problem, postnasal drip and sinus pressure.   Eyes: Negative for redness and itching.  Respiratory: Positive for cough, chest tightness and shortness of breath. Negative for wheezing.   Cardiovascular: Positive for palpitations. Negative for leg swelling.  Gastrointestinal: Negative for nausea and vomiting.  Genitourinary: Negative for dysuria.  Musculoskeletal: Negative for joint swelling.  Skin: Negative for rash.  Neurological: Positive for headaches.  Hematological: Does not bruise/bleed easily.  Psychiatric/Behavioral: Negative for dysphoric mood. The patient is not nervous/anxious.        Objective:   Physical Exam  Nursing note and vitals reviewed. Constitutional: He is oriented to person, place, and time. He appears well-developed and well-nourished. No distress.  HENT:  Head: Normocephalic and atraumatic.  Right Ear: External ear normal.  Left Ear: External ear normal.  Mouth/Throat: Oropharynx is clear and moist. No oropharyngeal exudate.  Eyes: Conjunctivae and EOM are normal. Pupils are equal, round, and reactive to light. Right eye exhibits no discharge. Left eye exhibits no discharge. No scleral icterus.  Neck: Normal range of motion. Neck supple. No JVD present. No tracheal deviation present. No thyromegaly present.  Cardiovascular: Normal rate, regular rhythm and intact distal pulses.  Exam reveals no gallop and no friction rub.   No murmur heard. Pulmonary/Chest: Effort normal and breath sounds normal. No respiratory distress. He has no wheezes. He has no rales. He exhibits  no tenderness.       decresaed breath sounds  Abdominal: Soft. Bowel sounds are normal. He exhibits no distension and no mass. There is no tenderness. There is no rebound and no guarding.  Musculoskeletal: Normal range of motion. He exhibits no edema and no  tenderness.  Lymphadenopathy:    He has no cervical adenopathy.  Neurological: He is alert and oriented to person, place, and time. He has normal reflexes. No cranial nerve deficit. Coordination normal.  Skin: Skin is warm and dry. No rash noted. He is not diaphoretic. No erythema. No pallor.  Psychiatric: He has a normal mood and affect. His behavior is normal. Judgment and thought content normal.          Assessment & Plan:

## 2012-03-15 NOTE — Assessment & Plan Note (Signed)
#  Shortness of breath  - might be due to copd from smoking and loss of fitness following hospitalization  - glad oxygen levels held up during walking in office  - please have full PFT breathing test and then we can ddecide next step

## 2012-03-15 NOTE — Patient Instructions (Addendum)
#  Shortness of breath  - might be due to copd from smoking and loss of fitness following hospitalization  - glad oxygen levels held up during walking in office  - please have full PFT breathing test and then we can ddecide next step  #Smoking  - even e cigs is bad; work on quitting them too  #Lung mass - please have CXR today, based on results will decide next CT scan chest  #Followup - return to see me after breathing tests done

## 2012-03-15 NOTE — Assessment & Plan Note (Signed)
stilll present on CXR  Plan Get ct chest in 2 weeks with contrast

## 2012-03-15 NOTE — Telephone Encounter (Signed)
I called # listed in chart for pt and was advised they have been disconnected. I called daughter # listed in pt chart ws not able to leave VM wcb. Will hold in triage to f/u on

## 2012-03-18 NOTE — Telephone Encounter (Signed)
Pt's mother Drenda Freeze notified. Pt will have PFT on 04/01/12  nad ov after with TP.

## 2012-03-29 ENCOUNTER — Ambulatory Visit (INDEPENDENT_AMBULATORY_CARE_PROVIDER_SITE_OTHER)
Admission: RE | Admit: 2012-03-29 | Discharge: 2012-03-29 | Disposition: A | Discharge status: Home / Self Care | Payer: Self-pay | Source: Ambulatory Visit | Attending: Internal Medicine | Admitting: Internal Medicine

## 2012-03-29 DIAGNOSIS — R222 Localized swelling, mass and lump, trunk: Secondary | ICD-10-CM

## 2012-03-29 DIAGNOSIS — R918 Other nonspecific abnormal finding of lung field: Secondary | ICD-10-CM

## 2012-03-29 DIAGNOSIS — R0609 Other forms of dyspnea: Secondary | ICD-10-CM

## 2012-03-29 DIAGNOSIS — F17200 Nicotine dependence, unspecified, uncomplicated: Secondary | ICD-10-CM

## 2012-03-29 DIAGNOSIS — R0600 Dyspnea, unspecified: Secondary | ICD-10-CM

## 2012-03-29 MED ORDER — IOHEXOL 300 MG/ML  SOLN
80.0000 mL | Freq: Once | INTRAMUSCULAR | Status: AC | PRN
  Administered 2012-03-29: 80 mL via INTRAVENOUS

## 2012-04-01 ENCOUNTER — Ambulatory Visit (INDEPENDENT_AMBULATORY_CARE_PROVIDER_SITE_OTHER): Payer: Self-pay | Admitting: Internal Medicine

## 2012-04-01 ENCOUNTER — Encounter: Payer: Self-pay | Admitting: Adult Health

## 2012-04-01 ENCOUNTER — Ambulatory Visit (INDEPENDENT_AMBULATORY_CARE_PROVIDER_SITE_OTHER): Payer: Self-pay | Admitting: Adult Health

## 2012-04-01 VITALS — BP 112/74 | HR 78 | Temp 97.8°F | Ht 74.0 in | Wt 166.0 lb

## 2012-04-01 DIAGNOSIS — F17200 Nicotine dependence, unspecified, uncomplicated: Secondary | ICD-10-CM

## 2012-04-01 DIAGNOSIS — R0600 Dyspnea, unspecified: Secondary | ICD-10-CM

## 2012-04-01 DIAGNOSIS — J690 Pneumonitis due to inhalation of food and vomit: Secondary | ICD-10-CM

## 2012-04-01 DIAGNOSIS — R918 Other nonspecific abnormal finding of lung field: Secondary | ICD-10-CM

## 2012-04-01 LAB — PULMONARY FUNCTION TEST

## 2012-04-01 NOTE — Progress Notes (Signed)
PFT done today. 

## 2012-04-01 NOTE — Patient Instructions (Addendum)
Follow up Dr. Marchelle Gearing in 2 months  and As needed   Most important goal is to quit smoking.   Will need to consider repeat CT chest 3 months to follow lung nodules

## 2012-04-03 ENCOUNTER — Encounter: Payer: Self-pay | Admitting: Adult Health

## 2012-04-03 NOTE — Progress Notes (Signed)
Subjective:    Patient ID: Angel [name], male    DOB: [birth year], 46 y.o.   MRN: [id number]  HPI 46 yo male, history of smoking and polysubstance abuse admitted by pulmonary and critical care medicine on December 24, 2011 for shock, acute respiratory failure, pneumonia, status post cardiac arrest . He had a prolonged hospitalization complicated by tracheostomy, post anoxic encephalopathy and dysphagia  02/07/2012 Post hospital follow-up Patient had a prolonged hospitalization. April 22 through 01/19/2012 after cardiac arrest with subsequent vent dependent respiratory failure, pneumonia, post anoxic encephalopathy, dysphagia, and bilateral superficial, upper extremity thrombi. Patient was found by friends unresponsive, EMS found pt in  PEA arrest, he was thusly started on hypothermia protocol. He was positive for cocaine, marijuana. He has an extensive history of smoking. He required tracheostomy. Due to failure of weaning off the ventilator. He required prolonged antibiotics. Due to suspected aspiration pneumonia. Blood cultures were positive for Haemophilus influenza. Patient initially had some delirium, but this slowly improved, along his hospital stay. He required physical therapy and occupational therapy for deconditioning and dysphagia. Was D. cannulated on may 15 initial echo showed an ejection fracture 2025% with diffuse hypokinesis. And a grade 1 diastolic dysfunction. Patient was evaluated by cardiology. He underwent a Myoview on May 15 that showed a scar at the base and mid inferior wall. On May 6 showed echo  ejection fracture at 60-65%. Cath showed no sign. CAD   Since discharge. Patient is feeling improved with decreased cough, congestion, and shortness of breath. He does continue to be somewhat weak, but feels that he is getting stronger each day. He has not restarted smoking or using drugs. We discussed outpatient counseling however, he declined. He "does not feel that he has a real  problem." He denies any hemoptysis, chest pain, orthopnea, PND, or leg swelling Chest x-ray today shows a persistent right midlung D. Left lung is clear   REC We are setting you up for a CT chest scan .  We are proud of you for quitting smoking  Call back if you change your mind on referral for counseling.  Please contact office for sooner follow up if symptoms do not improve or worsen or seek emergency care  Follow up with pulmonary doctor (first available )  in 3-4 weeks and As needed    OV 03/15/2012  FU    - PEA cardiac arrest April - May 2013 following H Flu pneumonia and  Bacteremia   - Smoking hx   - RLL superior segment nodular/mass like opacity  - developed new in hospital 01/09/12 to 01/19/12, persists on CT chest and cxr 02/07/12  PResents with mom for all of above. Says post cardiac arrest and discharge from hospital still having dyspnea with exertion. Improved initially but then plateaued. Dyspneic for groceries, walking stairs. Dyspnea relieved by rest. Seveity is rated as moderate. No associated chest pain. There eis assoicated cough but is mild and dry. Denies edema, orthopnea, pnd, hemoptysis, weight loss,fver  In terms of smoking and methadone abuse: quit regular cigs. Not doing methadone anymore. Doing e cigs. Does not know e cigs can be bad too     04/01/12 Follow w/ PFT  Returns for follow up to review PFT  FEV1 3.38L (84%) , ratio 76 , no sign change w/ BD, DLCO 77%  Has restarted smoking . Discussed smoking cessation   Denies drug use.  CT chest 03/29/12 showed Interval decrease in size of nodule in superior segment right  lower  lobe. Other pulmonary nodules are either slightly decreased,  slightly increased or unchanged  He is still weak with no energy.  Decreased dyspnea. No significant cough .  No hemoptysis or chest pain     ROS;  Constitutional:   No  weight loss, night sweats,  Fevers, chills,  +fatigue, or  lassitude.  HEENT:   No headaches,   Difficulty swallowing,  Tooth/dental problems, or  Sore throat,                No sneezing, itching, ear ache, nasal congestion,  +post nasal drip,   CV:  No chest pain,  Orthopnea, PND, swelling in lower extremities, anasarca, dizziness, palpitations, syncope.   GI  No heartburn, indigestion, abdominal pain, nausea, vomiting, diarrhea, change in bowel habits, loss of appetite, bloody stools.   Resp:  No non-productive cough,  No coughing up of blood.  No change in color of mucus.  No wheezing.  No chest wall deformity  Skin: no rash or lesions.  GU: no dysuria, change in color of urine, no urgency or frequency.  No flank pain, no hematuria   MS:  No joint pain or swelling.  No decreased range of motion.    Psych:  No change in mood or affect.  .  No memory loss.           Objective:   GEN: A/Ox3; pleasant , NAD, thin   HEENT:  [place]/AT,  EACs-clear, TMs-wnl, NOSE-clear, THROAT-clear, no lesions, no postnasal drip or exudate noted.   NECK:  Supple w/ fair ROM; no JVD; normal carotid impulses w/o bruits; no thyromegaly or nodules palpated; no lymphadenopathy.  RESP  Coarse BS  w/o, wheezes/ rales/ or rhonchi.no accessory muscle use, no dullness to percussion  CARD:  RRR, no m/r/g  , no peripheral edema, pulses intact, no cyanosis or clubbing.  GI:   Soft & nt; nml bowel sounds; no organomegaly or masses detected.  Musco: Warm bil, no deformities or joint swelling noted.   Neuro: alert, no focal deficits noted.    Skin: Warm, no lesions or rashes      Assessment & Plan:

## 2012-04-04 NOTE — Assessment & Plan Note (Signed)
Clinically improving  Xray and CT showing decreased nodularity  Will need repeat CT vs CXR in 3 months  Pt advised on smoking cessation  PFT shows preserved lung fxn despite smoking.  Cont on current regimen and follow up with Dr. Marchelle Gearing in 2-3 months and As needed

## 2012-04-04 NOTE — Assessment & Plan Note (Signed)
Consider repeating  CT chest in 3 months

## 2012-06-03 ENCOUNTER — Ambulatory Visit: Payer: Self-pay | Admitting: Internal Medicine

## 2012-06-20 ENCOUNTER — Telehealth: Payer: Self-pay | Admitting: Internal Medicine

## 2012-06-20 NOTE — Telephone Encounter (Signed)
Sorry not qualified to help in this situation. Thye have to go to an urgent care, primary care, or ER or seek a psychiatrist

## 2012-06-20 NOTE — Telephone Encounter (Signed)
I spoke with mother and is aware of this. She will see about finding pt a psychiatrist. Nothing further was needed

## 2012-06-20 NOTE — Telephone Encounter (Signed)
Called and spoke with patients mother.  She is requesting MR recommend/prescribe patient something to alleviate anxiety she feels patient is having.  She reports patient has been "withdrawn, angry at times, acting paranoid,scared,and he is just depressed." Patients mother states that she has talked to son and he does not want any "habit forming" medication ie. Xanax and Klonopin.  Patient has no PCP and mother states she is working on getting him diability --[facility]--Dr. Marchelle Gearing please advise, Thank You

## 2012-10-09 ENCOUNTER — Encounter: Payer: Self-pay | Admitting: Physician Assistant

## 2012-10-09 ENCOUNTER — Ambulatory Visit (INDEPENDENT_AMBULATORY_CARE_PROVIDER_SITE_OTHER): Payer: Medicaid Other | Admitting: Physician Assistant

## 2012-10-09 VITALS — BP 117/74 | HR 73 | Ht 74.0 in | Wt 182.0 lb

## 2012-10-09 DIAGNOSIS — R0602 Shortness of breath: Secondary | ICD-10-CM

## 2012-10-09 DIAGNOSIS — R0600 Dyspnea, unspecified: Secondary | ICD-10-CM

## 2012-10-09 DIAGNOSIS — I428 Other cardiomyopathies: Secondary | ICD-10-CM

## 2012-10-09 DIAGNOSIS — R0609 Other forms of dyspnea: Secondary | ICD-10-CM

## 2012-10-09 DIAGNOSIS — R079 Chest pain, unspecified: Secondary | ICD-10-CM

## 2012-10-09 DIAGNOSIS — R002 Palpitations: Secondary | ICD-10-CM

## 2012-10-09 LAB — CBC WITH DIFFERENTIAL/PLATELET
Basophils Relative: 0.5 % (ref 0.0–3.0)
Eosinophils Absolute: 0.3 10*3/uL (ref 0.0–0.7)
Eosinophils Relative: 3.5 % (ref 0.0–5.0)
Hemoglobin: 17.5 g/dL — ABNORMAL HIGH (ref 13.0–17.0)
Lymphocytes Relative: 33.8 % (ref 12.0–46.0)
MCHC: 33.9 g/dL (ref 30.0–36.0)
MCV: 95.1 fl (ref 78.0–100.0)
Monocytes Absolute: 0.6 10*3/uL (ref 0.1–1.0)
Neutro Abs: 5 10*3/uL (ref 1.4–7.7)
RBC: 5.43 Mil/uL (ref 4.22–5.81)

## 2012-10-09 LAB — BASIC METABOLIC PANEL
Calcium: 9.2 mg/dL (ref 8.4–10.5)
Creatinine, Ser: 1.1 mg/dL (ref 0.4–1.5)

## 2012-10-09 LAB — HEPATIC FUNCTION PANEL
Bilirubin, Direct: 0 mg/dL (ref 0.0–0.3)
Total Bilirubin: 0.4 mg/dL (ref 0.3–1.2)

## 2012-10-09 NOTE — Assessment & Plan Note (Signed)
Patient complains of dyspnea at rest and with exertion. He does smoke a pack of cigarettes daily. We will check a 2-D echo to followup his LV function.

## 2012-10-09 NOTE — Progress Notes (Signed)
HPI:  This is a 47 year old male patient who has a history of cardiac arrest in the setting of drug and alcohol use in April 2013.  Initially his echo showed an EF of 20-25% in the setting of cardiac arrest but follow-up echo in May 2013 ejection fraction was 60-65%. Myoview in May 2013 EF was 50% with inferior/inferior septal scar with anterior septal/inferior lateral ischemia. Follow-up cardiac catheterization showed normal coronary arteries EF 60-65%.  The patient recently received his Medicaid card and is able to schedule his followup. He complains of a one month history of palpitations described as a racing heart. It can last for an hour but is off and on. He said it is not irregular. It is associated with a sharp shooting pain in his left chest. He says the palpitations occur almost every day. He has some associated lightheadedness but no near syncope. He denies any chest tightness, pressure, dyspnea associated with the palpitations. He does get short of breath at rest and with exertion but feels it is related to his smoking history and anxiety. He was started on Zoloft 50 mg daily 2 weeks ago for anxiety depression. He states the sharp left-sided chest pain occurs on a daily basis with and without exertion. The patient drinks about 4 cups of coffee every morning but no other caffeine throughout the day. He denies taking any supplements or drug use or abuse.  No Known Allergies  Current Outpatient Prescriptions on File Prior to Visit: aspirin 81 MG chewable tablet, Chew 1 tablet (81 mg total) by mouth daily., Disp: 1 tablet, Rfl:  calcium carbonate (OS-CAL) 600 MG TABS, Take 600 mg by mouth 2 (two) times daily with a meal., Disp: , Rfl:  Multiple Vitamin (MULITIVITAMIN WITH MINERALS) TABS, Take 1 tablet by mouth daily., Disp: , Rfl:  Zoloft 50 mg one tablet daily   Past Medical History:   History of cardiac arrest                                      Comment:a. 12/2011 PEA arrest in setting of  drug/etoh               use   Nonischemic cardiomyopathy                                     Comment:a. 12/2011 Echo: EF 20-25% (in setting of PEA               arrest);  b. 01/2012 f/u Echo: EF 60-65%, 01/2012              Myoview: EF 58%, inf/infsept scar w/               anteroseptal/inflat ischemia;  d. 01/2012 Cath:               NL Cors, EF 60-65%   Aspiration pneumonia                                           Comment:a. 12/2011 in setting of PEA arrest   Polysubstance abuse  Comment:a. 12/2011 UDS + for cocaine, THC   History of respiratory failure                                 Comment:a. 12/2011 in setting of PEA   History of bacteremia                                          Comment:a. G. coccobacilli 12/2011   Anoxic encephalopathy                                          Comment:a. 12/2011   Pulmonary infiltrate in right lung on chest x-*                Comment:a. 01/2012  w/ rec for outpt f/u cxr  Past Surgical History:   CARDIAC CATHETERIZATION                         5/13           Comment:left heart with angiogram  No family history on file.   Social History   Marital Status: Single              Spouse Name:                      Years of Education:                 Number of children:             Occupational History   None on file  Social History Main Topics   Smoking Status: Current Every Day Smoker        Packs/Day: 1.3   Years: 35        Types: Cigarettes   Smokeless Status: Never Used                       Alcohol Use: Yes            Drug Use: Yes               Special: Cocaine, Marijuana      Comment: history of cocaine   Sexual Activity: Yes                Other Topics            Concern   None on file  Social History Narrative   None on file    ZOX:WRUEAVW complains of lack of energy and easy fatigue. 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 Neck: No JVD, HJR, Bruit, or thyroid enlargement  Lungs: No tachypnea, clear without wheezing, rales, or rhonchi  Cardiovascular: RRR, PMI not displaced, heart sounds normal, no murmurs, gallops, bruit, thrill, or heave.  Abdomen: BS normal. Soft without organomegaly, masses, lesions or tenderness.  Extremities: without cyanosis, clubbing or edema. Good distal pulses bilateral  SKin: Warm, no lesions or rashes   Musculoskeletal: No deformities  Neuro: no focal signs BP 117/74  Pulse 73  Ht 6\' 2"  (1.88 m)  Wt 182 lb (82.555 kg)  BMI 23.37 kg/m2    ZOX:WRUEAV sinus rhythm no acute change  Cardiac Cath:01/2012: Left mainstem: No angiographic CAD.  Left anterior descending (LAD): Mild luminal irregularities.    Left circumflex (LCx): No angiographic CAD.   Right coronary artery (RCA): No angiographic CAD.  There was a high acute marginal that could be selectively engaged.    Left ventriculography: Left ventricular systolic function is normal, LVEF is estimated at 60-65%, there is no significant mitral regurgitation.  No regional wall motion abnormalities.    Final Conclusions:  No significant CAD.  Normal LV systolic function.  [name] 01/19/2012, 1:27 PM

## 2012-10-09 NOTE — Assessment & Plan Note (Signed)
Patient has history of nonischemic cardiomyopathy but his EF normalized in 01/2012. No evidence of heart failure on exam today. Followup 2-D echo.

## 2012-10-09 NOTE — Assessment & Plan Note (Signed)
Patient's chest pain is sharp shooting and sometimes but not always associated with the palpitations. I doubt that this is cardiac related. Check 2-D echo and follow up with Dr. Excell Seltzer.

## 2012-10-09 NOTE — Assessment & Plan Note (Signed)
Patient complains of one-month history of palpitations which is a new problem for him. I have ordered another recorder to rule out arrhythmia, labs including TSH, and 2-D echo to reassess his LV function. Patient will followup with Dr. Excell Seltzer in one month. He is advised to decrease his caffeine intake and cigarette abuse

## 2012-10-09 NOTE — Patient Instructions (Addendum)
Your physician has requested that you have an echocardiogram. Echocardiography is a painless test that uses sound waves to create images of your heart. It provides your doctor with information about the size and shape of your heart and how well your heart's chambers and valves are working. This procedure takes approximately one hour. There are no restrictions for this procedure.   Your physician has recommended that you wear an event monitor. Event monitors are medical devices that record the heart's electrical activity. Doctors most often [place] these monitors to diagnose arrhythmias. Arrhythmias are problems with the speed or rhythm of the heartbeat. The monitor is a small, portable device. You can wear one while you do your normal daily activities. This is usually used to diagnose what is causing palpitations/syncope (passing out).  Your physician recommends that you return for lab work in: today (cmet, cbc, tsh)  Your physician recommends that you schedule a follow-up appointment in: one month with Dr Excell Seltzer  DECREASE your CAFFEINE  STOP SMOKING

## 2012-10-18 ENCOUNTER — Other Ambulatory Visit (HOSPITAL_COMMUNITY): Payer: Medicaid Other

## 2012-10-24 ENCOUNTER — Ambulatory Visit (INDEPENDENT_AMBULATORY_CARE_PROVIDER_SITE_OTHER): Payer: Medicaid Other

## 2012-10-24 ENCOUNTER — Ambulatory Visit (HOSPITAL_COMMUNITY): Payer: Medicaid Other | Attending: Physician Assistant | Admitting: Radiology

## 2012-10-24 DIAGNOSIS — R002 Palpitations: Secondary | ICD-10-CM

## 2012-10-24 DIAGNOSIS — R0989 Other specified symptoms and signs involving the circulatory and respiratory systems: Secondary | ICD-10-CM | POA: Insufficient documentation

## 2012-10-24 DIAGNOSIS — F17200 Nicotine dependence, unspecified, uncomplicated: Secondary | ICD-10-CM | POA: Insufficient documentation

## 2012-10-24 DIAGNOSIS — Z86718 Personal history of other venous thrombosis and embolism: Secondary | ICD-10-CM | POA: Insufficient documentation

## 2012-10-24 DIAGNOSIS — R079 Chest pain, unspecified: Secondary | ICD-10-CM | POA: Insufficient documentation

## 2012-10-24 DIAGNOSIS — R0609 Other forms of dyspnea: Secondary | ICD-10-CM | POA: Insufficient documentation

## 2012-10-24 DIAGNOSIS — R0602 Shortness of breath: Secondary | ICD-10-CM

## 2012-10-24 DIAGNOSIS — I059 Rheumatic mitral valve disease, unspecified: Secondary | ICD-10-CM | POA: Insufficient documentation

## 2012-10-24 NOTE — Progress Notes (Signed)
Echocardiogram performed.  

## 2012-10-25 NOTE — Progress Notes (Signed)
Placed a 30 day monitor and went over instructions on how to use it and when to return it

## 2012-10-28 ENCOUNTER — Encounter: Payer: Self-pay | Admitting: *Deleted

## 2012-12-04 ENCOUNTER — Encounter: Payer: Self-pay | Admitting: Cardiovascular Disease

## 2012-12-04 ENCOUNTER — Ambulatory Visit (INDEPENDENT_AMBULATORY_CARE_PROVIDER_SITE_OTHER): Payer: Medicaid Other | Admitting: Cardiovascular Disease

## 2012-12-04 VITALS — BP 118/78 | HR 72 | Ht 74.0 in | Wt 182.0 lb

## 2012-12-04 DIAGNOSIS — R002 Palpitations: Secondary | ICD-10-CM

## 2012-12-04 NOTE — Progress Notes (Signed)
   HPI:  47 year old gentleman for followup evaluation. He presented with an out of hospital PEA arrest one year ago. This occurred in the setting of drug and alcohol abuse. Initially his ejection fraction was 25%, but this normalized with followup assessment of LVEF 60-65%. He underwent diagnostic catheterization showing normal coronary arteries. He was last seen in February for palpitations. An event monitor was done in this showed no significant arrhythmia. He had isolated PACs.  From a symptomatic perspective, he is doing reasonably well. He's been able to stay off of alcohol and drugs. He continues to smoke cigarettes. He has occasional sharp chest pains and complains of "burning" in his lungs.  Outpatient Encounter Prescriptions as of 12/04/2012  Medication Sig Dispense Refill  . aspirin 81 MG chewable tablet Chew 1 tablet (81 mg total) by mouth daily.  1 tablet    . calcium carbonate (OS-CAL) 600 MG TABS Take 600 mg by mouth 2 (two) times daily with a meal.      . Multiple Vitamin (MULITIVITAMIN WITH MINERALS) TABS Take 1 tablet by mouth daily.      . sertraline (ZOLOFT) 50 MG tablet Take 1 tablet (50 mg total) by mouth daily.  30 tablet  0   No facility-administered encounter medications on file as of 12/04/2012.    No Known Allergies  Past Medical History  Diagnosis Date  . History of cardiac arrest     a. 12/2011 PEA arrest in setting of drug/etoh use  . Nonischemic cardiomyopathy     a. 12/2011 Echo: EF 20-25% (in setting of PEA arrest);  b. 01/2012 f/u Echo: EF 60-65%, 01/2012 Myoview: EF 58%, inf/infsept scar w/ anteroseptal/inflat ischemia;  d. 01/2012 Cath: NL Cors, EF 60-65%  . Aspiration pneumonia     a. 12/2011 in setting of PEA arrest  . Polysubstance abuse     a. 12/2011 UDS + for cocaine, THC  . History of respiratory failure     a. 12/2011 in setting of PEA  . History of bacteremia     a. G. coccobacilli 12/2011  . Anoxic encephalopathy     a. 12/2011  . Pulmonary infiltrate  in right lung on chest x-ray     a. 01/2012  w/ rec for outpt f/u cxr    ROS: Negative except as per HPI  BP 118/78  Pulse 72  Ht 6\' 2"  (1.88 m)  Wt 82.555 kg (182 lb)  BMI 23.36 kg/m2  PHYSICAL EXAM: Pt is alert and oriented, NAD HEENT: normal Neck: JVP - normal, carotids 2+= without bruits Lungs: CTA bilaterally CV: RRR without murmur or gallop Abd: soft, NT, Positive BS, no hepatomegaly Ext: no C/C/E, distal pulses intact and equal Skin: warm/dry no rash  Event recorder: Normal sinus rhythm, occasional PACs  ASSESSMENT AND PLAN: 1. History of cardiac arrest. No coronary disease and now with normal LV function. This was related to drug and alcohol use. He understands the importance of remaining clean. I will see him back in one year for followup.  2. Palpitations. Unremarkable event recorder reviewed. He was reassured today.  Tonny Bollman 12/04/2012 11:06 AM

## 2012-12-04 NOTE — Patient Instructions (Signed)
Your physician wants you to follow-up in: 1 YEAR with Dr Cooper.  You will receive a reminder letter in the mail two months in advance. If you don't receive a letter, please call our office to schedule the follow-up appointment.  Your physician recommends that you continue on your current medications as directed. Please refer to the Current Medication list given to you today.  

## 2013-01-17 ENCOUNTER — Encounter (HOSPITAL_COMMUNITY): Payer: Self-pay | Admitting: *Deleted

## 2013-01-17 ENCOUNTER — Emergency Department (HOSPITAL_COMMUNITY)
Admission: EM | Admit: 2013-01-17 | Discharge: 2013-01-17 | Disposition: A | Discharge status: Home / Self Care | Payer: Medicaid Other | Source: Home / Self Care

## 2013-01-17 DIAGNOSIS — G8929 Other chronic pain: Secondary | ICD-10-CM

## 2013-01-17 DIAGNOSIS — M25569 Pain in unspecified knee: Secondary | ICD-10-CM

## 2013-01-17 MED ORDER — TRAMADOL HCL 50 MG PO TABS: 50.0000 mg | ORAL_TABLET | Freq: Four times a day (QID) | ORAL | Status: DC | PRN

## 2013-01-17 NOTE — ED Provider Notes (Signed)
Medical screening examination/treatment/procedure(s) were performed by resident physician or non-physician practitioner and as supervising physician I was immediately available for consultation/collaboration.   KINDL,JAMES DOUGLAS MD.   James D Kindl, MD 01/17/13 2047 

## 2013-01-17 NOTE — ED Provider Notes (Signed)
History     CSN: [id number]  Arrival date & time 01/17/13  1854   First MD Initiated Contact with Patient 01/17/13 1957      No chief complaint on file.   (Consider location/radiation/quality/duration/timing/severity/associated sxs/prior treatment) HPI Comments: 47 year old male with bilateral knee pain for several years. Just to come and go but present more often than not. Occasionally has to have fluid of the knee. He has no known injury but he does have to work on his knees quite a bit and has to walk long distances at a time. No recent history of trauma. He usually takes tramadol and an anti-inflammatory medicine for pain he is out of tramadol. States he does have an appointment with his doctor next week but just needs a few to time over.   Past Medical History  Diagnosis Date  . History of cardiac arrest     a. 12/2011 PEA arrest in setting of drug/etoh use  . Nonischemic cardiomyopathy     a. 12/2011 Echo: EF 20-25% (in setting of PEA arrest);  b. 01/2012 f/u Echo: EF 60-65%, 01/2012 Myoview: EF 58%, inf/infsept scar w/ anteroseptal/inflat ischemia;  d. 01/2012 Cath: NL Cors, EF 60-65%  . Aspiration pneumonia     a. 12/2011 in setting of PEA arrest  . Polysubstance abuse     a. 12/2011 UDS + for cocaine, THC  . History of respiratory failure     a. 12/2011 in setting of PEA  . History of bacteremia     a. G. coccobacilli 12/2011  . Anoxic encephalopathy     a. 12/2011  . Pulmonary infiltrate in right lung on chest x-ray     a. 01/2012  w/ rec for outpt f/u cxr    Past Surgical History  Procedure Laterality Date  . Cardiac catheterization  5/13    left heart with angiogram    No family history on file.  History  Substance Use Topics  . Smoking status: Current Every Day Smoker -- 1.30 packs/day for 35 years    Types: Cigarettes  . Smokeless tobacco: Never Used  . Alcohol Use: Yes      Review of Systems  Constitutional: Negative.   Respiratory: Negative.    Gastrointestinal: Negative.   Genitourinary: Negative.   Musculoskeletal: Positive for arthralgias.       As per HPI  Skin: Negative.   Neurological: Negative for dizziness, weakness, numbness and headaches.    Allergies  Review of patient's allergies indicates no known allergies.  Home Medications   Current Outpatient Rx  Name  Route  Sig  Dispense  Refill  . aspirin 81 MG chewable tablet   Oral   Chew 1 tablet (81 mg total) by mouth daily.   1 tablet      . calcium carbonate (OS-CAL) 600 MG TABS   Oral   Take 600 mg by mouth 2 (two) times daily with a meal.         . Multiple Vitamin (MULITIVITAMIN WITH MINERALS) TABS   Oral   Take 1 tablet by mouth daily.         . sertraline (ZOLOFT) 50 MG tablet   Oral   Take 1 tablet (50 mg total) by mouth daily.   30 tablet   0   . traMADol (ULTRAM) 50 MG tablet   Oral   Take 1 tablet (50 mg total) by mouth every 6 (six) hours as needed for pain.   20 tablet   0  BP 119/77  Pulse 68  Temp(Src) 98.2 F (36.8 C) (Oral)  SpO2 97%  Physical Exam  Nursing note and vitals reviewed. Constitutional: He is oriented to person, place, and time. He appears well-developed and well-nourished.  HENT:  Head: Normocephalic and atraumatic.  Eyes: EOM are normal. Left eye exhibits no discharge.  Neck: Normal range of motion.  Musculoskeletal: Normal range of motion. He exhibits no edema and no tenderness.  Bilateral knees without discoloration, swelling, deformity or other observed pathology. No tenderness. No apparent effusion. Extension and flexion is intact. Negative drawer. No laxity appreciated. Gait is balanced with full weightbearing.  Neurological: He is alert and oriented to person, place, and time. No cranial nerve deficit.  Skin: Skin is warm and dry.  Psychiatric: He has a normal mood and affect.    ED Course  Procedures (including critical care time)  Labs Reviewed - No data to display No results  found.   1. Bilateral chronic knee pain       MDM  Tramadol 50 mg Q8 hours when necessary pain #20 Follow with your doctor next week as scheduled. Limit bending squatting and working on the knees.  Hayden Rasmussen, NP 01/17/13 2009

## 2013-01-17 NOTE — ED Notes (Signed)
C/o bil. Knee pain R worse than L x 10 years.  No known injury.  He does painting and tile work.  He has swelling at times.

## 2013-06-05 ENCOUNTER — Ambulatory Visit
Admission: RE | Admit: 2013-06-05 | Discharge: 2013-06-05 | Disposition: A | Discharge status: Home / Self Care | Payer: Medicaid Other | Source: Ambulatory Visit | Attending: Family Medicine | Admitting: Family Medicine

## 2013-06-05 ENCOUNTER — Other Ambulatory Visit: Payer: Self-pay | Admitting: Family Medicine

## 2013-06-05 DIAGNOSIS — R0602 Shortness of breath: Secondary | ICD-10-CM

## 2013-06-06 ENCOUNTER — Other Ambulatory Visit: Payer: Self-pay | Admitting: Family Medicine

## 2013-06-06 DIAGNOSIS — R079 Chest pain, unspecified: Secondary | ICD-10-CM

## 2013-06-06 DIAGNOSIS — R0602 Shortness of breath: Secondary | ICD-10-CM

## 2013-06-09 ENCOUNTER — Inpatient Hospital Stay: Admission: RE | Admit: 2013-06-09 | Payer: Medicaid Other | Source: Ambulatory Visit

## 2013-06-11 ENCOUNTER — Ambulatory Visit
Admission: RE | Admit: 2013-06-11 | Discharge: 2013-06-11 | Disposition: A | Discharge status: Home / Self Care | Payer: Medicaid Other | Source: Ambulatory Visit | Attending: Family Medicine | Admitting: Family Medicine

## 2013-06-11 DIAGNOSIS — R0602 Shortness of breath: Secondary | ICD-10-CM

## 2013-06-11 DIAGNOSIS — R079 Chest pain, unspecified: Secondary | ICD-10-CM

## 2013-06-11 MED ORDER — IOHEXOL 300 MG/ML  SOLN
75.0000 mL | Freq: Once | INTRAMUSCULAR | Status: AC | PRN
  Administered 2013-06-11: 75 mL via INTRAVENOUS

## 2013-08-11 ENCOUNTER — Encounter: Payer: Self-pay | Admitting: Internal Medicine

## 2013-08-11 ENCOUNTER — Ambulatory Visit (INDEPENDENT_AMBULATORY_CARE_PROVIDER_SITE_OTHER): Payer: Medicaid Other | Admitting: Internal Medicine

## 2013-08-11 VITALS — BP 120/72 | HR 88 | Ht 71.0 in | Wt 178.0 lb

## 2013-08-11 DIAGNOSIS — R0600 Dyspnea, unspecified: Secondary | ICD-10-CM

## 2013-08-11 DIAGNOSIS — R0602 Shortness of breath: Secondary | ICD-10-CM

## 2013-08-11 DIAGNOSIS — R0609 Other forms of dyspnea: Secondary | ICD-10-CM

## 2013-08-11 NOTE — Progress Notes (Signed)
Subjective:    Patient ID: Angel [name], male    DOB: 1965-09-13, 47 y.o.   MRN: [id number]  HPI 47 yo male, history of smoking and polysubstance abuse admitted by pulmonary and critical care medicine on December 24, 2011 for shock, acute respiratory failure, pneumonia, status post cardiac arrest . He had a prolonged hospitalization complicated by tracheostomy, post anoxic encephalopathy and dysphagia  02/07/2012 Post hospital follow-up Patient had a prolonged hospitalization. April 22 through 01/19/2012 after cardiac arrest with subsequent vent dependent respiratory failure, pneumonia, post anoxic encephalopathy, dysphagia, and bilateral superficial, upper extremity thrombi. Patient was found by friends unresponsive, EMS found pt in  PEA arrest, he was thusly started on hypothermia protocol. He was positive for cocaine, marijuana. He has an extensive history of smoking. He required tracheostomy. Due to failure of weaning off the ventilator. He required prolonged antibiotics. Due to suspected aspiration pneumonia. Blood cultures were positive for Haemophilus influenza. Patient initially had some delirium, but this slowly improved, along his hospital stay. He required physical therapy and occupational therapy for deconditioning and dysphagia. Was D. cannulated on may 15 initial echo showed an ejection fracture 2025% with diffuse hypokinesis. And a grade 1 diastolic dysfunction. Patient was evaluated by cardiology. He underwent a Myoview on May 15 that showed a scar at the base and mid inferior wall. On May 6 showed echo  ejection fracture at 60-65%. Cath showed no sign. CAD   Since discharge. Patient is feeling improved with decreased cough, congestion, and shortness of breath. He does continue to be somewhat weak, but feels that he is getting stronger each day. He has not restarted smoking or using drugs. We discussed outpatient counseling however, he declined. He "does not feel that he has a real  problem." He denies any hemoptysis, chest pain, orthopnea, PND, or leg swelling Chest x-ray today shows a persistent right midlung D. Left lung is clear   REC We are setting you up for a CT chest scan .  We are proud of you for quitting smoking  Call back if you change your mind on referral for counseling.  Please contact office for sooner follow up if symptoms do not improve or worsen or seek emergency care  Follow up with pulmonary doctor (first available )  in 3-4 weeks and As needed    OV 03/15/2012  FU    - PEA cardiac arrest April - May 2013 following H Flu pneumonia and  Bacteremia   - Smoking hx   - RLL superior segment nodular/mass like opacity  - developed new in hospital 01/09/12 to 01/19/12, persists on CT chest and cxr 02/07/12    PResents with mom for all of above. Says post cardiac arrest and discharge from hospital still having dyspnea with exertion. Improved initially but then plateaued. Dyspneic for groceries, walking stairs. Dyspnea relieved by rest. Seveity is rated as moderate. No associated chest pain. There eis assoicated cough but is mild and dry. Denies edema, orthopnea, pnd, hemoptysis, weight loss,fver  In terms of smoking and methadone abuse: quit regular cigs. Not doing methadone anymore. Doing e cigs. Does not know e cigs can be bad too  In terms of RLL sup segment mass   - it is persistent on CT chest 02/07/12 (which is 3- 4 weeks since first seen in hospital)  CT chest 02/07/12 IMPRESSION:  1. Mass-like opacity within the superior segment right lower lobe,  corresponding to the plain film abnormality. Surrounding  interstitial/reticular nodular opacity. Similar to  01/15/2012. As  this was not well visualized on more remote exams, including  01/05/2012, an infectious or inflammatory etiology is suspected.  Neoplasm cannot be excluded. Potential clinical strategies include  CT follow-up at 4 - 6 weeks after appropriate antibiotic therapy  versus further  evaluation with PET. Consultation with the [facility] [phone]) should be  considered.  2. Moderate centrilobular emphysema with underlying ill-defined  small pulmonary nodules. Favored to represent infection, including  atypical etiologies. Pulmonary Langerhan's cell histiocytosis  would be an alternate explanation. Consider follow-up with chest  CT at 6 months to confirm stability or resolution of these areas.  Original Report Authenticated By: Consuello Bossier, M.D.      CXR today 03/15/2012 /Dg Chest 2 View  03/15/2012  *RADIOLOGY REPORT*  Clinical Data: 47 year old male with lung mass.  CHEST - 2 VIEW  Comparison: 02/07/2012 and earlier.  Findings: Mass-like opacity projecting in the right mid lung on the frontal views has regressed but not completely resolved (previously up to 32 mm diameter radiographically, now 27 mm).  As before this is poorly visible on the lateral view, it is posteriorly situated. Right hilar contours remain within normal limits. Other mediastinal contours are within normal limits.  Visualized tracheal air column is within normal limits.  No pneumothorax, pulmonary edema, pleural effusion or new pulmonary opacity. No acute osseous abnormality identified.  IMPRESSION: 1.  Right lung mass-like opacity has slightly decreased radiographically since 02/07/2012. While this might represent infection with incomplete response, at this point I agree with the recommendation on the prior for a repeat chest CT this month (IV contrast preferred). 2.  No new cardiopulmonary abnormality.  These results will be called to the ordering clinician or representative by the Radiologist Assistant, and communication documented in the PACS Dashboard.  Original Report Authenticated By: Harley Hallmark, M.D.    Follow up Dr. Marchelle Gearing in 2 months  and As needed   Most important goal is to quit smoking.  Will need to consider repeat CT chest 3 months to follow  lung nodules    OV 08/11/2013  Chief Complaint  Patient presents with  . Shortness of Breath    pt is c/o lungs burning and SOB. Pt also states that he was awaken by a choking sensation.     I have not seen this patient in 18 months. He has returned it because of shortness of breath of insidious onset present for years but recently worse. Brought on by exertion relieved by rest. Shortness of breath is associated with long-standing atypical chest pain in the precordial area that comes and goes at any time at rest and is not exertion related. There is some associated wheezing and mild cough. He is refusing flu shot. He continues to smoke. Is not on any inhaler treatment.  Pulmonary Function test 04/01/2012 shows mildly reduced DLCO and total lung capacity but normal spirometry  CLINICAL DATA: Chest pain, shortness of breath, questionable lesion  on chest x-ray  EXAM:  CT CHEST WITH CONTRAST  07/01/13 TECHNIQUE:  Multidetector CT imaging of the chest was performed during  intravenous contrast administration.  CONTRAST: 75mL OMNIPAQUE IOHEXOL 300 MG/ML SOLN  COMPARISON: Chest x-ray of 06/05/2013  FINDINGS:  The irregular opacity questioned in the right mid lung on chest  x-ray corresponds to linear scarring posteriorly within the right  lower lobe within the superior segment. No suspicious lung nodule or  mass is seen. On the mild dependent bibasilar atelectasis is  noted.  No focal infiltrate or effusion is seen. There are changes of mild  centrilobular emphysema particularly within the upper lobes. The  central airway is patent.  On soft tissue window images, the thyroid gland is unremarkable. The  thoracic aorta opacifies with no acute abnormality and origins of  the great vessels appear to be patent. The pulmonary arteries  opacify with no gross abnormality noted. No mediastinal or hilar  adenopathy is seen. No abnormality of the upper abdomen is noted.  The thoracic vertebrae are  in normal alignment with no acute  abnormality.  IMPRESSION:  1. The area questioned on chest x-ray in the mid right lung  corresponds to a focus of linear scarring posteriorly within the  superior segment of the right lower lobe. No suspicious lung nodule  or mass is seen.  2. Mild centrilobular emphysema particularly within the upper lobes.  Electronically Signed  By: Dwyane Dee M.D.  On: 06/11/2013 11:39    Review of Systems  Constitutional: Negative for fever and unexpected weight change.  HENT: Negative for congestion, dental problem, ear pain, nosebleeds, postnasal drip, rhinorrhea, sinus pressure, sneezing, sore throat and trouble swallowing.   Eyes: Negative for redness and itching.  Respiratory: Positive for cough, choking and shortness of breath. Negative for chest tightness and wheezing.   Cardiovascular: Negative for palpitations and leg swelling.  Gastrointestinal: Negative for nausea and vomiting.  Genitourinary: Negative for dysuria.  Musculoskeletal: Negative for joint swelling.  Skin: Negative for rash.  Neurological: Negative for headaches.  Hematological: Does not bruise/bleed easily.  Psychiatric/Behavioral: Negative for dysphoric mood. The patient is not nervous/anxious.    Current outpatient prescriptions:calcium carbonate (OS-CAL) 600 MG TABS, Take 600 mg by mouth 2 (two) times daily with a meal., Disp: , Rfl: ;  Multiple Vitamin (MULITIVITAMIN WITH MINERALS) TABS, Take 1 tablet by mouth daily., Disp: , Rfl: ;  sertraline (ZOLOFT) 50 MG tablet, Take 1 tablet (50 mg total) by mouth daily., Disp: 30 tablet, Rfl: 0 traMADol (ULTRAM) 50 MG tablet, Take 1 tablet (50 mg total) by mouth every 6 (six) hours as needed for pain., Disp: 20 tablet, Rfl: 0     Objective:   Physical Exam  Nursing note and vitals reviewed. Constitutional: He is oriented to person, place, and time. He appears well-developed and well-nourished. No distress.  HENT:  Head: Normocephalic and  atraumatic.  Right Ear: External ear normal.  Left Ear: External ear normal.  Mouth/Throat: Oropharynx is clear and moist. No oropharyngeal exudate.  Tracheostomy scar  Eyes: Conjunctivae and EOM are normal. Pupils are equal, round, and reactive to light. Right eye exhibits no discharge. Left eye exhibits no discharge. No scleral icterus.  Neck: Normal range of motion. Neck supple. No JVD present. No tracheal deviation present. No thyromegaly present.  Cardiovascular: Normal rate, regular rhythm and intact distal pulses.  Exam reveals no gallop and no friction rub.   No murmur heard. Pulmonary/Chest: Effort normal and breath sounds normal. No respiratory distress. He has no wheezes. He has no rales. He exhibits no tenderness.  Abdominal: Soft. Bowel sounds are normal. He exhibits no distension and no mass. There is no tenderness. There is no rebound and no guarding.  Musculoskeletal: Normal range of motion. He exhibits no edema and no tenderness.  Lymphadenopathy:    He has no cervical adenopathy.  Neurological: He is alert and oriented to person, place, and time. He has normal reflexes. No cranial nerve deficit. Coordination normal.  Skin: Skin is warm and  dry. No rash noted. He is not diaphoretic. No erythema. No pallor.  Psychiatric: He has a normal mood and affect. His behavior is normal. Judgment and thought content normal.          Assessment & Plan:

## 2013-08-11 NOTE — Patient Instructions (Signed)
Unclear why you're short of breath Try Spiriva 1 puff once daily without fail on a daily basis In 1 month have full pulmonary function test Return to see me or my nurse practitioner Tammy in 1 month with pulmonary function test If you're not improved at followup we'll do pulmonary stress test

## 2013-08-13 ENCOUNTER — Emergency Department (INDEPENDENT_AMBULATORY_CARE_PROVIDER_SITE_OTHER): Payer: Medicaid Other

## 2013-08-13 ENCOUNTER — Emergency Department (HOSPITAL_COMMUNITY)
Admission: EM | Admit: 2013-08-13 | Discharge: 2013-08-13 | Disposition: A | Discharge status: Home / Self Care | Payer: Medicaid Other | Source: Home / Self Care | Attending: Emergency Medicine | Admitting: Emergency Medicine

## 2013-08-13 ENCOUNTER — Encounter (HOSPITAL_COMMUNITY): Payer: Self-pay | Admitting: Emergency Medicine

## 2013-08-13 DIAGNOSIS — S52123A Displaced fracture of head of unspecified radius, initial encounter for closed fracture: Secondary | ICD-10-CM

## 2013-08-13 DIAGNOSIS — S52122A Displaced fracture of head of left radius, initial encounter for closed fracture: Secondary | ICD-10-CM

## 2013-08-13 MED ORDER — OXYCODONE-ACETAMINOPHEN 5-325 MG PO TABS: ORAL_TABLET | ORAL | Status: DC

## 2013-08-13 MED ORDER — HYDROCODONE-ACETAMINOPHEN 5-325 MG PO TABS
ORAL_TABLET | ORAL | Status: AC
  Filled 2013-08-13: qty 2

## 2013-08-13 MED ORDER — HYDROCODONE-ACETAMINOPHEN 5-325 MG PO TABS
2.0000 | ORAL_TABLET | Freq: Once | ORAL | Status: AC
  Administered 2013-08-13: 2 via ORAL

## 2013-08-13 NOTE — ED Provider Notes (Signed)
Chief Complaint:   Chief Complaint  Patient presents with  . Arm Injury    History of Present Illness:   Angel [name] is a 47 year old male who fell 8-10 feet off a ladder yesterday evening. This happened a friend's home. He landed on his left side on concrete. There was no loss of consciousness, he did not hit his head, and does not have a headache. He has pain mostly in his left elbow, but also in his left shoulder, wrist, and hand. His fingers feel numb but he is able to move them well. He denies any pain or rib cage, abdomen, lower back her neck, or lower extremities. He has had no neurological symptoms or persistent vomiting.  Review of Systems:  Other than noted above, the patient denies any of the following symptoms: Systemic:  No fevers or chills. Eye:  No diplopia or blurred vision. ENT:  No headache, facial pain, or bleeding from the nose or ears.  No loose or broken teeth. Neck:  No neck pain or stiffnes. Resp:  No shortness of breath. Cardiac:  No chest pain. No palpitations, dizziness, syncope or fainting. GI:  No abdominal pain. No nausea, vomiting, or diarrhea. GU:  No blood in urine. M-S:  No extremity pain, swelling, bruising, limited ROM, neck or back pain. Neuro:  No headache, loss of consciousness, seizure activity, dizziness, vertigo, paresthesias, numbness, or weakness.  No difficulty with speech or ambulation.   PMFSH:  Past medical history, family history, social history, meds, and allergies were reviewed.  His only medication is Zoloft.  Physical Exam:   Vital signs:  Pulse 72  Temp(Src) 98.6 F (37 C) (Oral)  Resp 16  SpO2 98% General:  Alert, oriented and in no distress. Eye:  PERRL, full EOMs. ENT:  No cranial or facial tenderness to palpation. Neck:  No tenderness to palpation.  Full ROM without pain. Heart:  Regular rhythm.  No extrasystoles, gallops, or murmers. Lungs:  No chest wall tenderness to palpation. Breath sounds clear and equal bilaterally.   No wheezes, rales or rhonchi. Abdomen:  Non tender. Back:  Non tender to palpation.  Full ROM without pain. Extremities:  There is tenderness to palpation over the entire left arm from the shoulder on down to the hand, but mostly over the elbow. He does have some movement of the shoulder but it hurts most of the pain on movement is in his elbow. He has a little bit of pain on movement of the wrist. He has some bruising of the hand. Capillary refill is good, pulses are full, he has intact sensation to light touch.  Full ROM of all joints without pain.  Pulses full.  Brisk capillary refill. Neuro:  Alert and oriented times 3.  Cranial nerves intact.  No muscle weakness.  Sensation intact to light touch.  Gait normal. Skin:  No bruising, abrasions, or lacerations.  Radiology:  Dg Elbow Complete Left  08/13/2013   CLINICAL DATA:  Fall.  Posterior elbow pain.  EXAM: LEFT ELBOW - COMPLETE 3+ VIEW  COMPARISON:  None.  FINDINGS: An elbow effusion is present. The elbow is located. There is slight lucency about the radial head suggesting a nondisplaced fracture. No other fractures are evident.  IMPRESSION: 1. Left elbow joint effusion. 2. Suspected nondisplaced radial head fracture.   Electronically Signed   By: Gennette Pac M.D.   On: 08/13/2013 10:47   Dg Forearm Left  08/13/2013   CLINICAL DATA:  Fall.  Left forearm  pain.  EXAM: LEFT FOREARM - 2 VIEW  COMPARISON:  Double radiographs of the same day.  FINDINGS: Soft tissue swelling is present about the elbow. An elbow joint effusion is present. A nondisplaced radial head fracture is present. The distal radius and ulna are intact. The wrist is unremarkable.  IMPRESSION: 1. Elbow effusion. 2. Suspected nondisplaced radial head fracture.   Electronically Signed   By: Gennette Pac M.D.   On: 08/13/2013 10:49   Dg Shoulder Left  08/13/2013   CLINICAL DATA:  Status post fall.  Left shoulder pain.  EXAM: LEFT SHOULDER - 2+ VIEW  COMPARISON:  None.  FINDINGS:  There is no acute bony or joint abnormality. Mild acromioclavicular degenerative change is noted. Imaged left lung and ribs are unremarkable.  IMPRESSION: No acute finding.   Electronically Signed   By: Drusilla Kanner M.D.   On: 08/13/2013 10:20   Dg Hand Complete Left  08/13/2013   CLINICAL DATA:  Fall.  Left hand pain.  EXAM: LEFT HAND - COMPLETE 3+ VIEW  COMPARISON:  Forearm radiographs 08/13/2013.  FINDINGS: Anatomic alignment of the bones of the left hand. There is no fracture identified. Soft tissues appear within normal limits.  IMPRESSION: Negative.   Electronically Signed   By: Andreas Newport M.D.   On: 08/13/2013 10:40    Course in Urgent Care Center:   He was placed in a posterior splint and sling. Given Norco 5/325 2 for pain.  Assessment:  The encounter diagnosis was Fracture of radial head, left, closed, initial encounter.  This is a nondisplaced radial head fracture. Will need followup by orthopedist.  Plan:   1.  Meds:  The following meds were prescribed:   Discharge Medication List as of 08/13/2013 11:15 AM    START taking these medications   Details  oxyCODONE-acetaminophen (PERCOCET) 5-325 MG per tablet 1 to 2 tablets every 6 hours as needed for pain., Print        2.  Patient Education/Counseling:  The patient was given appropriate handouts, self care instructions, and instructed in symptomatic relief.  Instructed in cast and splint care.  3.  Follow up:  The patient was told to follow up if no better in 3 to 4 days, if becoming worse in any way, and given some red flag symptoms such as any evidence of decreasing perfusion such as pain, pallor, paresthesias which would prompt immediate return.  Follow up with Dr. Margarita Rana next week.     Reuben Likes, MD 08/13/13 2119

## 2013-08-13 NOTE — ED Notes (Signed)
Fell     Off  A  Ladder     -  Injury  To  l  Elbow         Decreased  rom     Swelling  Present     -  denys  Any  Other  Injury    Except  To the  Arm     Occurred  Last pm

## 2013-08-13 NOTE — Progress Notes (Signed)
Orthopedic Tech Progress Note Patient Details:  Angel [name] 1965/12/30 [id number]  Ortho Devices Type of Ortho Device: Long arm splint;Arm sling Ortho Device/Splint Interventions: Application   Cammer, [name] 08/13/2013, 11:33 AM

## 2013-08-22 NOTE — Assessment & Plan Note (Signed)
Unclear why you're short of breath Try Spiriva 1 puff once daily without fail on a daily basis In 1 month have full pulmonary function test Return to see me or my nurse practitioner Tammy in 1 month with pulmonary function test If you're not improved at followup we'll do pulmonary stress test 

## 2013-09-17 ENCOUNTER — Encounter: Payer: Self-pay | Admitting: Internal Medicine

## 2013-09-17 ENCOUNTER — Ambulatory Visit (INDEPENDENT_AMBULATORY_CARE_PROVIDER_SITE_OTHER): Payer: Medicaid Other | Admitting: Internal Medicine

## 2013-09-17 VITALS — BP 130/84 | HR 80 | Temp 98.0°F | Ht 74.0 in | Wt 175.0 lb

## 2013-09-17 DIAGNOSIS — R0602 Shortness of breath: Secondary | ICD-10-CM

## 2013-09-17 DIAGNOSIS — R0609 Other forms of dyspnea: Secondary | ICD-10-CM

## 2013-09-17 DIAGNOSIS — F17200 Nicotine dependence, unspecified, uncomplicated: Secondary | ICD-10-CM | POA: Insufficient documentation

## 2013-09-17 DIAGNOSIS — R0683 Snoring: Secondary | ICD-10-CM

## 2013-09-17 DIAGNOSIS — R0989 Other specified symptoms and signs involving the circulatory and respiratory systems: Secondary | ICD-10-CM

## 2013-09-17 DIAGNOSIS — R0600 Dyspnea, unspecified: Secondary | ICD-10-CM

## 2013-09-17 DIAGNOSIS — J438 Other emphysema: Secondary | ICD-10-CM

## 2013-09-17 LAB — PULMONARY FUNCTION TEST
DL/VA % PRED: 94 %
DL/VA: 4.48 ml/min/mmHg/L
DLCO UNC: 27.88 ml/min/mmHg
DLCO unc % pred: 79 %
FEF 25-75 Post: 3.96 L/sec
FEF 25-75 Pre: 3.12 L/sec
FEF2575-%CHANGE-POST: 27 %
FEF2575-%Pred-Post: 106 %
FEF2575-%Pred-Pre: 83 %
FEV1-%Change-Post: 6 %
FEV1-%Pred-Post: 92 %
FEV1-%Pred-Pre: 87 %
FEV1-POST: 3.92 L
FEV1-Pre: 3.7 L
FEV1FVC-%Change-Post: 3 %
FEV1FVC-%Pred-Pre: 98 %
FEV6-%CHANGE-POST: 2 %
FEV6-%PRED-PRE: 90 %
FEV6-%Pred-Post: 92 %
FEV6-PRE: 4.76 L
FEV6-Post: 4.87 L
FEV6FVC-%Change-Post: 0 %
FEV6FVC-%PRED-POST: 102 %
FEV6FVC-%Pred-Pre: 102 %
FVC-%Change-Post: 1 %
FVC-%PRED-POST: 90 %
FVC-%Pred-Pre: 88 %
FVC-PRE: 4.8 L
FVC-Post: 4.9 L
POST FEV6/FVC RATIO: 99 %
PRE FEV6/FVC RATIO: 99 %
Post FEV1/FVC ratio: 80 %
Pre FEV1/FVC ratio: 77 %
RV % PRED: 102 %
RV: 2.17 L
TLC % PRED: 99 %
TLC: 7.33 L

## 2013-09-17 MED ORDER — PREDNISONE 10 MG PO TABS: ORAL_TABLET | ORAL | Status: DC

## 2013-09-17 NOTE — Progress Notes (Signed)
PFT done today. 

## 2013-09-17 NOTE — Assessment & Plan Note (Signed)
Mild with isolated low dlco. Continue spiriva. Some crackle/wheeze on exam; will do 5d prednisone

## 2013-09-17 NOTE — Assessment & Plan Note (Signed)
Refer sleep doc. High ESS. Might be related to tracheostomy status in past

## 2013-09-17 NOTE — Progress Notes (Signed)
Subjective:    Patient ID: Angel [name], male    DOB: 1966-07-19, 48 y.o.   MRN: [id number]  Shortness of Breath     48 yo male, history of smoking and polysubstance abuse admitted by pulmonary and critical care medicine on December 24, 2011 for shock, acute respiratory failure, pneumonia, status post cardiac arrest . He had a prolonged hospitalization complicated by tracheostomy, post anoxic encephalopathy and dysphagia  02/07/2012 Post hospital follow-up Patient had a prolonged hospitalization. April 22 through 01/19/2012 after cardiac arrest with subsequent vent dependent respiratory failure, pneumonia, post anoxic encephalopathy, dysphagia, and bilateral superficial, upper extremity thrombi. Patient was found by friends unresponsive, EMS found pt in  PEA arrest, he was thusly started on hypothermia protocol. He was positive for cocaine, marijuana. He has an extensive history of smoking. He required tracheostomy. Due to failure of weaning off the ventilator. He required prolonged antibiotics. Due to suspected aspiration pneumonia. Blood cultures were positive for Haemophilus influenza. Patient initially had some delirium, but this slowly improved, along his hospital stay. He required physical therapy and occupational therapy for deconditioning and dysphagia. Was D. cannulated on may 15 initial echo showed an ejection fracture 2025% with diffuse hypokinesis. And a grade 1 diastolic dysfunction. Patient was evaluated by cardiology. He underwent a Myoview on May 15 that showed a scar at the base and mid inferior wall. On May 6 showed echo  ejection fracture at 60-65%. Cath showed no sign. CAD   Since discharge. Patient is feeling improved with decreased cough, congestion, and shortness of breath. He does continue to be somewhat weak, but feels that he is getting stronger each day. He has not restarted smoking or using drugs. We discussed outpatient counseling however, he declined. He "does not feel  that he has a real problem." He denies any hemoptysis, chest pain, orthopnea, PND, or leg swelling Chest x-ray today shows a persistent right midlung D. Left lung is clear   REC We are setting you up for a CT chest scan .  We are proud of you for quitting smoking  Call back if you change your mind on referral for counseling.  Please contact office for sooner follow up if symptoms do not improve or worsen or seek emergency care  Follow up with pulmonary doctor (first available )  in 3-4 weeks and As needed    OV 03/15/2012  FU    - PEA cardiac arrest April - May 2013 following H Flu pneumonia and  Bacteremia   - Smoking hx   - RLL superior segment nodular/mass like opacity  - developed new in hospital 01/09/12 to 01/19/12, persists on CT chest and cxr 02/07/12    PResents with mom for all of above. Says post cardiac arrest and discharge from hospital still having dyspnea with exertion. Improved initially but then plateaued. Dyspneic for groceries, walking stairs. Dyspnea relieved by rest. Seveity is rated as moderate. No associated chest pain. There eis assoicated cough but is mild and dry. Denies edema, orthopnea, pnd, hemoptysis, weight loss,fver  In terms of smoking and methadone abuse: quit regular cigs. Not doing methadone anymore. Doing e cigs. Does not know e cigs can be bad too  In terms of RLL sup segment mass   - it is persistent on CT chest 02/07/12 (which is 3- 4 weeks since first seen in hospital)  CT chest 02/07/12 IMPRESSION:  1. Mass-like opacity within the superior segment right lower lobe,  corresponding to the plain film abnormality. Surrounding  interstitial/reticular nodular opacity. Similar to 01/15/2012. As  this was not well visualized on more remote exams, including  01/05/2012, an infectious or inflammatory etiology is suspected.  Neoplasm cannot be excluded. Potential clinical strategies include  CT follow-up at 4 - 6 weeks after appropriate antibiotic therapy   versus further evaluation with PET. Consultation with the [facility] [phone]) should be  considered.  2. Moderate centrilobular emphysema with underlying ill-defined  small pulmonary nodules. Favored to represent infection, including  atypical etiologies. Pulmonary Langerhan's cell histiocytosis  would be an alternate explanation. Consider follow-up with chest  CT at 6 months to confirm stability or resolution of these areas.  Original Report Authenticated By: Consuello Bossier, M.D.      CXR today 03/15/2012 /Dg Chest 2 View  03/15/2012  *RADIOLOGY REPORT*  Clinical Data: 48 year old male with lung mass.  CHEST - 2 VIEW  Comparison: 02/07/2012 and earlier.  Findings: Mass-like opacity projecting in the right mid lung on the frontal views has regressed but not completely resolved (previously up to 32 mm diameter radiographically, now 27 mm).  As before this is poorly visible on the lateral view, it is posteriorly situated. Right hilar contours remain within normal limits. Other mediastinal contours are within normal limits.  Visualized tracheal air column is within normal limits.  No pneumothorax, pulmonary edema, pleural effusion or new pulmonary opacity. No acute osseous abnormality identified.  IMPRESSION: 1.  Right lung mass-like opacity has slightly decreased radiographically since 02/07/2012. While this might represent infection with incomplete response, at this point I agree with the recommendation on the prior for a repeat chest CT this month (IV contrast preferred). 2.  No new cardiopulmonary abnormality.  These results will be called to the ordering clinician or representative by the Radiologist Assistant, and communication documented in the PACS Dashboard.  Original Report Authenticated By: Harley Hallmark, M.D.    Follow up Dr. Marchelle Gearing in 2 months  and As needed   Most important goal is to quit smoking.  Will need to consider repeat CT chest 3  months to follow lung nodules    OV 08/11/2013  Chief Complaint  Patient presents with  . Shortness of Breath    pt is c/o lungs burning and SOB. Pt also states that he was awaken by a choking sensation.     I have not seen this patient in 18 months. He has returned it because of shortness of breath of insidious onset present for years but recently worse. Brought on by exertion relieved by rest. Shortness of breath is associated with long-standing atypical chest pain in the precordial area that comes and goes at any time at rest and is not exertion related. There is some associated wheezing and mild cough. He is refusing flu shot. He continues to smoke. Is not on any inhaler treatment.  Pulmonary Function test 04/01/2012 shows mildly reduced DLCO and total lung capacity but normal spirometry  CLINICAL DATA: Chest pain, shortness of breath, questionable lesion  on chest x-ray  EXAM:  CT CHEST WITH CONTRAST  07/01/13 TECHNIQUE:  Multidetector CT imaging of the chest was performed during  intravenous contrast administration.  CONTRAST: 75mL OMNIPAQUE IOHEXOL 300 MG/ML SOLN  COMPARISON: Chest x-ray of 06/05/2013  FINDINGS:  The irregular opacity questioned in the right mid lung on chest  x-ray corresponds to linear scarring posteriorly within the right  lower lobe within the superior segment. No suspicious lung nodule or  mass is seen. On the  mild dependent bibasilar atelectasis is noted.  No focal infiltrate or effusion is seen. There are changes of mild  centrilobular emphysema particularly within the upper lobes. The  central airway is patent.  On soft tissue window images, the thyroid gland is unremarkable. The  thoracic aorta opacifies with no acute abnormality and origins of  the great vessels appear to be patent. The pulmonary arteries  opacify with no gross abnormality noted. No mediastinal or hilar  adenopathy is seen. No abnormality of the upper abdomen is noted.  The  thoracic vertebrae are in normal alignment with no acute  abnormality.  IMPRESSION:  1. The area questioned on chest x-ray in the mid right lung  corresponds to a focus of linear scarring posteriorly within the  superior segment of the right lower lobe. No suspicious lung nodule  or mass is seen.  2. Mild centrilobular emphysema particularly within the upper lobes.  Electronically Signed  By: Dwyane Dee M.D.  On: 06/11/2013 11:39  REC Unclear why you're short of breath Try Spiriva 1 puff once daily without fail on a daily basis In 1 month have full pulmonary function test Return to see me or my nurse practitioner Tammy in 1 month with pulmonary function test If you're not improved at followup we'll do pulmonary stress test   OV 09/17/2013   Chief Complaint  Patient presents with  . Shortness of Breath    Breathing is unchanged. Reports SOB, coughing, chest tightness. Feels Spiriva has helped some. Still smoking.   FU dyspnea   - spiriva helping somewhat, "40%" with dyspnea. STill with residual dyspnea that he feels is significant. HAppens with exertion and rest. No change since last visit. STable. Associated with cough + . PFT 09/17/13: NORMAL. DLCO 79%; similar top 2013. ECHo FEb 2014: EF 55% and improved. Does not feel he is sick but does admit to some chest tightness.  He also admits to smoring (note: s/p trrach, ? Is this stridor) as reported by kids and excess day time somnolence. Epworth Sleep Score is 16 nand VERY HIGH     Review of Systems  Respiratory: Positive for shortness of breath.     Positive for dyspnea, cough, snoring and excess daytime somnolence NEgatie 11 point ROS otherwise    Objective:   Physical Exam  Nursing note and vitals reviewed. Constitutional: He is oriented to person, place, and time. He appears well-developed and well-nourished. No distress.  HENT:  Head: Normocephalic and atraumatic.  Right Ear: External ear normal.  Left Ear: External  ear normal.  Mouth/Throat: Oropharynx is clear and moist. No oropharyngeal exudate.  Eyes: Conjunctivae and EOM are normal. Pupils are equal, round, and reactive to light. Right eye exhibits no discharge. Left eye exhibits no discharge. No scleral icterus.  Neck: Normal range of motion. Neck supple. No JVD present. No tracheal deviation present. No thyromegaly present.  Cardiovascular: Normal rate, regular rhythm and intact distal pulses.  Exam reveals no gallop and no friction rub.   No murmur heard. Pulmonary/Chest: Effort normal and breath sounds normal. No respiratory distress. He has no wheezes. He has no rales. He exhibits no tenderness.  Coarse with some scattered crackles at base  Abdominal: Soft. Bowel sounds are normal. He exhibits no distension and no mass. There is no tenderness. There is no rebound and no guarding.  Musculoskeletal: Normal range of motion. He exhibits no edema and no tenderness.  Lymphadenopathy:    He has no cervical adenopathy.  Neurological: He is alert  and oriented to person, place, and time. He has normal reflexes. No cranial nerve deficit. Coordination normal.  Skin: Skin is warm and dry. No rash noted. He is not diaphoretic. No erythema. No pallor.  Psychiatric: He has a normal mood and affect. His behavior is normal. Judgment and thought content normal.          Assessment & Plan:  #smoking   - please quit  #EMphysema and shortness of breath   - continue spiriva 1 puff daily  - Please take prednisone 40 mg x1 day, then 30 mg x1 day, then 20 mg x1 day, then 10 mg x1 day, and then 5 mg x1 day and stop - For dyspnea that is out of proprotion: do CPST bike test in 2 week  #SNoring and excessive day time sleepiness  - could be related to prior tracheostomy  - refer to one of the sleep docs in our office  #FOllowup  - see one of the sleep docs in our office  - CPST bike test in 2 weeks  - return to see me after CPST bike test

## 2013-09-17 NOTE — Assessment & Plan Note (Signed)
Advised to quit.  

## 2013-09-17 NOTE — Assessment & Plan Note (Signed)
Do CPST because spirivan has not completely resolved dyspnea

## 2013-09-17 NOTE — Patient Instructions (Signed)
#  smoking   - please quit  #EMphysema and shortness of breath   - continue spiriva 1 puff daily  - Please take prednisone 40 mg x1 day, then 30 mg x1 day, then 20 mg x1 day, then 10 mg x1 day, and then 5 mg x1 day and stop - For dyspnea that is out of proprotion: do CPST bike test in 2 week  #SNoring and excessive day time sleepiness  - could be related to prior tracheostomy  - refer to one of the sleep docs in our office  #FOllowup  - see one of the sleep docs in our office  - CPST bike test in 2 weeks  - return to see me after CPST bike test

## 2013-10-01 ENCOUNTER — Ambulatory Visit (HOSPITAL_COMMUNITY): Payer: Medicaid Other | Attending: Internal Medicine

## 2013-10-01 DIAGNOSIS — R0609 Other forms of dyspnea: Secondary | ICD-10-CM | POA: Insufficient documentation

## 2013-10-01 DIAGNOSIS — R0989 Other specified symptoms and signs involving the circulatory and respiratory systems: Principal | ICD-10-CM | POA: Insufficient documentation

## 2013-10-01 DIAGNOSIS — R0600 Dyspnea, unspecified: Secondary | ICD-10-CM

## 2013-10-15 ENCOUNTER — Encounter: Payer: Self-pay | Admitting: Pulmonary Disease

## 2013-10-15 ENCOUNTER — Ambulatory Visit (INDEPENDENT_AMBULATORY_CARE_PROVIDER_SITE_OTHER): Payer: Medicaid Other | Admitting: Pulmonary Disease

## 2013-10-15 VITALS — BP 104/76 | HR 80 | Temp 98.0°F | Ht 74.0 in | Wt 174.2 lb

## 2013-10-15 DIAGNOSIS — R0989 Other specified symptoms and signs involving the circulatory and respiratory systems: Secondary | ICD-10-CM

## 2013-10-15 DIAGNOSIS — R0609 Other forms of dyspnea: Secondary | ICD-10-CM

## 2013-10-15 DIAGNOSIS — R0683 Snoring: Secondary | ICD-10-CM

## 2013-10-15 NOTE — Assessment & Plan Note (Signed)
The patient has a history of loud snoring, and also some of the classic findings usually associated with sleep disordered breathing. However, he is not obese, and does not have a large neck size. He does have a history of an anoxic brain injury, and this may be associated. He also has a strong family history of obstructive sleep apnea as well. At this point, I think there is more than enough suspicion to justify evaluating with a sleep study. The patient is agreeable to this approach.

## 2013-10-15 NOTE — Progress Notes (Signed)
Subjective:    Patient ID: Angel [name], male    DOB: 08/26/1966, 48 y.o.   MRN: [id number]  HPI The patient is a 48 year old male who I've been asked to see for possible obstructive sleep apnea. He has been noted to have loud snoring, as well as witnessed apneas during sleep. He also notes occasional gasping arousals, but this is infrequent. He awakens at least 3 times a night, and feels unrested more than 50% of the time. He notes definite inappropriate daytime sleepiness with inactivity such as reading, and will fall asleep easily watching television or movies in the evening. He has some sleep pressure driving, but it is intermittent in nature. The patient states that his weight is been stable over the last few years, and his Epworth score today is abnormal at 17.   Sleep Questionnaire What time do you typically go to bed?( Between what hours) 10-11pm 10-11pm at 1139 on 10/15/13 by Maisie FusAshtyn M Green, CMA How long does it take you to fall asleep? 10-5630mins 10-5830mins at 1139 on 10/15/13 by Maisie FusAshtyn M Green, CMA How many times during the night do you wake up? 3 3 at 1139 on 10/15/13 by Maisie FusAshtyn M Green, CMA What time do you get out of bed to start your day? No Value 6-7am at 1139 on 10/15/13 by Maisie FusAshtyn M Green, CMA Do you drive or operate heavy machinery in your occupation? No No at 1139 on 10/15/13 by Maisie FusAshtyn M Green, CMA How much has your weight changed (up or down) over the past two years? (In pounds) 0 oz (0 kg) 0 oz (0 kg) at 1139 on 10/15/13 by Maisie FusAshtyn M Green, CMA Have you ever had a sleep study before? No No at 1139 on 10/15/13 by Maisie FusAshtyn M Green, CMA Do you currently use CPAP? No No at 1139 on 10/15/13 by Maisie FusAshtyn M Green, CMA Do you wear oxygen at any time? No No at 1139 on 10/15/13 by Maisie FusAshtyn M Green, CMA   Review of Systems  Constitutional: Negative for fever and unexpected weight change.  HENT: Positive for congestion. Negative for dental problem, ear pain, nosebleeds,  postnasal drip, rhinorrhea, sinus pressure, sneezing, sore throat and trouble swallowing.   Eyes: Negative for redness and itching.  Respiratory: Positive for cough and shortness of breath. Negative for chest tightness and wheezing.   Cardiovascular: Positive for chest pain and palpitations. Negative for leg swelling.  Gastrointestinal: Negative for nausea and vomiting.  Genitourinary: Negative for dysuria.  Musculoskeletal: Positive for arthralgias and joint swelling.  Skin: Negative for rash.  Neurological: Positive for headaches.  Hematological: Does not bruise/bleed easily.  Psychiatric/Behavioral: Negative for dysphoric mood. The patient is not nervous/anxious.        Objective:   Physical Exam Constitutional:  Well developed, no acute distress  HENT:  Nares patent without discharge, septal deviation to the left with narrowing.  Oropharynx without exudate, palate and uvula are mildly thickened and elongated.   Eyes:  Perrla, eomi, no scleral icterus  Neck:  No JVD, no TMG  Cardiovascular:  Normal rate, regular rhythm, no rubs or gallops.  No murmurs        Intact distal pulses  Pulmonary :  Normal breath sounds, no stridor or respiratory distress   No rales, rhonchi, or wheezing  Abdominal:  Soft, nondistended, bowel sounds present.  No tenderness noted.   Musculoskeletal:  No lower extremity edema noted.  Lymph Nodes:  No cervical lymphadenopathy noted  Skin:  No cyanosis noted  Neurologic:  Alert, appropriate, moves all 4 extremities without obvious deficit.         Assessment & Plan:

## 2013-10-15 NOTE — Patient Instructions (Signed)
Will schedule for sleep study, and arrange followup once the results are available.   

## 2013-10-16 ENCOUNTER — Ambulatory Visit: Payer: Medicaid Other | Admitting: Internal Medicine

## 2013-11-05 ENCOUNTER — Encounter: Payer: Self-pay | Admitting: Internal Medicine

## 2013-11-06 ENCOUNTER — Ambulatory Visit (HOSPITAL_BASED_OUTPATIENT_CLINIC_OR_DEPARTMENT_OTHER): Payer: Medicaid Other | Attending: Pulmonary Disease

## 2013-11-06 VITALS — Ht 74.0 in | Wt 170.0 lb

## 2013-11-06 DIAGNOSIS — G4733 Obstructive sleep apnea (adult) (pediatric): Secondary | ICD-10-CM

## 2013-11-06 DIAGNOSIS — R0683 Snoring: Secondary | ICD-10-CM

## 2013-11-06 DIAGNOSIS — G4730 Sleep apnea, unspecified: Principal | ICD-10-CM

## 2013-11-06 DIAGNOSIS — G4710 Hypersomnia, unspecified: Secondary | ICD-10-CM | POA: Insufficient documentation

## 2013-11-10 DIAGNOSIS — G4733 Obstructive sleep apnea (adult) (pediatric): Secondary | ICD-10-CM

## 2013-11-10 DIAGNOSIS — R0609 Other forms of dyspnea: Secondary | ICD-10-CM

## 2013-11-10 DIAGNOSIS — R0989 Other specified symptoms and signs involving the circulatory and respiratory systems: Secondary | ICD-10-CM

## 2013-11-10 NOTE — Sleep Study (Signed)
   NAME: Angel [name] DATE OF BIRTH:  05/20/1966 MEDICAL RECORD NUMBER [id number]  LOCATION: [facility]  PHYSICIAN: Barbaraann ShareCLANCE,KEITH M  DATE OF STUDY: 11/06/2013  SLEEP STUDY TYPE: Nocturnal Polysomnogram               REFERRING PHYSICIAN: Clance, Maree KrabbeKeith M, MD  INDICATION FOR STUDY: Hypersomnia with sleep apnea  EPWORTH SLEEPINESS SCORE:  20 HEIGHT: 6\' 2"  (188 cm)  WEIGHT: 170 lb (77.111 kg)    Body mass index is 21.82 kg/(m^2).  NECK SIZE: 15 in.  MEDICATIONS: Reviewed in sleep chart  SLEEP ARCHITECTURE: The patient had a total sleep time of 310 minutes with minimal slow-wave sleep and only 12 minutes of REM. Sleep onset latency was normal at 11 minutes, and REM onset was normal at 102 minutes. Sleep efficiency was mildly reduced at 85%.  RESPIRATORY DATA: The patient was found to have no obstructive apneas or hypoxemia is, giving him an AHI of 0. There were no significant RERAS noted.  Moderate snoring was noted during the night.  OXYGEN DATA: The patient had transient oxygen desaturation as low as 91% during sleep.  CARDIAC DATA: Rare PAC noted, but no clinically significant arrhythmias were seen  MOVEMENT/PARASOMNIA: No significant limb movements or abnormal behaviors were noted.  IMPRESSION/ RECOMMENDATION:    1) no clinically significant sleep disordered breathing was noted during the study. The patient did have decreased REM and slow-wave sleep, however had more than adequate opportunity to exhibit clinically significant sleep apnea. The patient's significant daytime sleepiness cannot be explained by this study, and perhaps he may have a post anoxic brain injury hypersomnia. Would consider referral to neurology for further evaluation.  2) rare PAC noted, but no clinically significant arrhythmias were seen.    Barbaraann ShareLANCE,KEITH M Diplomate, American Board of Sleep Medicine  ELECTRONICALLY SIGNED ON:  11/10/2013, 9:56 AM [facility] PH: [phone]   FX: [phone] ACCREDITED BY THE AMERICAN ACADEMY OF SLEEP MEDICINE

## 2013-11-12 ENCOUNTER — Encounter: Payer: Self-pay | Admitting: Pulmonary Disease

## 2013-11-12 ENCOUNTER — Telehealth: Payer: Self-pay | Admitting: Pulmonary Disease

## 2013-11-12 DIAGNOSIS — G4710 Hypersomnia, unspecified: Secondary | ICD-10-CM

## 2013-11-12 NOTE — Telephone Encounter (Signed)
Called and reviewed sleep study with pt.  He has no sleep apnea or RERAS, but did have moderate snoring.  Nothing on the study to explain his daytime sleepiness.   ? If due to post anoxic brain injury  (CNS hypersomnia). Would like to refer him to neuro for evaluation since he feels his sleepiness is affecting  His QOL.  Pt is agreeable to this.

## 2013-11-30 ENCOUNTER — Telehealth: Payer: Self-pay | Admitting: Internal Medicine

## 2013-11-30 DIAGNOSIS — R0602 Shortness of breath: Secondary | ICD-10-CM

## 2013-11-30 NOTE — Telephone Encounter (Signed)
Supposed to come back in feb 2015 after cpst but no appt given despite him having CPST. Pleaes have him come by in April; opening new slots that you should check with Tammy Davis/scheduler   Dr. Kalman ShanMurali Lanique [name], M.D., [facility] RegionF.C.C.P Pulmonary and Critical Care Medicine Staff Physician [facility] Pager: [phone], If no answer or between  15:00h - 7:00h: call [phone]  11/30/2013 12:20 PM

## 2013-12-04 NOTE — Telephone Encounter (Signed)
[email or web address].Tysheka Fanguy, CMA  

## 2013-12-08 ENCOUNTER — Institutional Professional Consult (permissible substitution): Payer: Self-pay | Admitting: Neurology

## 2013-12-09 ENCOUNTER — Ambulatory Visit: Payer: Medicaid Other | Admitting: Cardiovascular Disease

## 2013-12-09 NOTE — Telephone Encounter (Signed)
Pt called back appt scheduled.

## 2013-12-09 NOTE — Telephone Encounter (Signed)
LMTCBx2. Jennifer Castillo, CMA  

## 2013-12-12 ENCOUNTER — Encounter: Payer: Self-pay | Admitting: Cardiovascular Disease

## 2013-12-19 ENCOUNTER — Encounter (HOSPITAL_COMMUNITY): Payer: Self-pay | Admitting: Emergency Medicine

## 2013-12-19 ENCOUNTER — Emergency Department (HOSPITAL_COMMUNITY)
Admission: EM | Admit: 2013-12-19 | Discharge: 2013-12-19 | Disposition: A | Discharge status: Home / Self Care | Payer: Medicaid Other | Source: Home / Self Care | Attending: Family Medicine | Admitting: Family Medicine

## 2013-12-19 DIAGNOSIS — M545 Low back pain, unspecified: Secondary | ICD-10-CM

## 2013-12-19 LAB — POCT URINALYSIS DIP (DEVICE)
Bilirubin Urine: NEGATIVE
Glucose, UA: NEGATIVE mg/dL
Hgb urine dipstick: NEGATIVE
Ketones, ur: NEGATIVE mg/dL
Leukocytes, UA: NEGATIVE
Nitrite: NEGATIVE
PH: 5.5 (ref 5.0–8.0)
Protein, ur: NEGATIVE mg/dL
UROBILINOGEN UA: 0.2 mg/dL (ref 0.0–1.0)

## 2013-12-19 MED ORDER — DICLOFENAC SODIUM 75 MG PO TBEC: 75.0000 mg | DELAYED_RELEASE_TABLET | Freq: Two times a day (BID) | ORAL | Status: DC | PRN

## 2013-12-19 MED ORDER — CYCLOBENZAPRINE HCL 10 MG PO TABS: 10.0000 mg | ORAL_TABLET | Freq: Every evening | ORAL | Status: DC | PRN

## 2013-12-19 MED ORDER — TRAMADOL HCL 50 MG PO TABS: 50.0000 mg | ORAL_TABLET | Freq: Four times a day (QID) | ORAL | Status: DC | PRN

## 2013-12-19 NOTE — Discharge Instructions (Signed)
Thank you for coming in today. Take the medication as directed.  Use a heating pad.  Come back or go to the emergency room if you notice new weakness new numbness problems walking or bowel or bladder problems.  Back Exercises Back exercises help treat and prevent back injuries. The goal of back exercises is to increase the strength of your abdominal and back muscles and the flexibility of your back. These exercises should be started when you no longer have back pain. Back exercises include:  Pelvic Tilt. Lie on your back with your knees bent. Tilt your pelvis until the lower part of your back is against the floor. Hold this position 5 to 10 sec and repeat 5 to 10 times.  Knee to Chest. Pull first 1 knee up against your chest and hold for 20 to 30 seconds, repeat this with the other knee, and then both knees. This may be done with the other leg straight or bent, whichever feels better.  Sit-Ups or Curl-Ups. Bend your knees 90 degrees. Start with tilting your pelvis, and do a partial, slow sit-up, lifting your trunk only 30 to 45 degrees off the floor. Take at least 2 to 3 seconds for each sit-up. Do not do sit-ups with your knees out straight. If partial sit-ups are difficult, simply do the above but with only tightening your abdominal muscles and holding it as directed.  Hip-Lift. Lie on your back with your knees flexed 90 degrees. Push down with your feet and shoulders as you raise your hips a couple inches off the floor; hold for 10 seconds, repeat 5 to 10 times.  Back arches. Lie on your stomach, propping yourself up on bent elbows. Slowly press on your hands, causing an arch in your low back. Repeat 3 to 5 times. Any initial stiffness and discomfort should lessen with repetition over time.  Shoulder-Lifts. Lie face down with arms beside your body. Keep hips and torso pressed to floor as you slowly lift your head and shoulders off the floor. Do not overdo your exercises, especially in the  beginning. Exercises may cause you some mild back discomfort which lasts for a few minutes; however, if the pain is more severe, or lasts for more than 15 minutes, do not continue exercises until you see your caregiver. Improvement with exercise therapy for back problems is slow.  See your caregivers for assistance with developing a proper back exercise program. Document Released: 09/28/2004 Document Revised: 11/13/2011 Document Reviewed: 06/22/2011 [facility] Patient Information 2014 [employer], [place].

## 2013-12-19 NOTE — ED Provider Notes (Signed)
Angel [name] is a 48 y.o. male who presents to Urgent Care today for left low back pain. Pain present certain yesterday without injury. No radiating pain weakness or numbness. No bowel or bladder problems. No fevers or chills nausea vomiting or diarrhea. Patient feels well otherwise.   Past Medical History  Diagnosis Date  . History of cardiac arrest     a. 12/2011 PEA arrest in setting of drug/etoh use  . Nonischemic cardiomyopathy     a. 12/2011 Echo: EF 20-25% (in setting of PEA arrest);  b. 01/2012 f/u Echo: EF 60-65%, 01/2012 Myoview: EF 58%, inf/infsept scar w/ anteroseptal/inflat ischemia;  d. 01/2012 Cath: NL Cors, EF 60-65%  . Aspiration pneumonia     a. 12/2011 in setting of PEA arrest  . Polysubstance abuse     a. 12/2011 UDS + for cocaine, THC  . History of respiratory failure     a. 12/2011 in setting of PEA  . History of bacteremia     a. G. coccobacilli 12/2011  . Anoxic encephalopathy     a. 12/2011  . Pulmonary infiltrate in right lung on chest x-ray     a. 01/2012  w/ rec for outpt f/u cxr   History  Substance Use Topics  . Smoking status: Current Every Day Smoker -- 1.00 packs/day for 35 years    Types: Cigarettes  . Smokeless tobacco: Never Used  . Alcohol Use: No   ROS as above Medications: No current facility-administered medications for this encounter.   Current Outpatient Prescriptions  Medication Sig Dispense Refill  . calcium carbonate (OS-CAL) 600 MG TABS Take 600 mg by mouth 2 (two) times daily with a meal.      . cyclobenzaprine (FLEXERIL) 10 MG tablet Take 1 tablet (10 mg total) by mouth at bedtime as needed for muscle spasms.  20 tablet  0  . diclofenac (VOLTAREN) 75 MG EC tablet Take 1 tablet (75 mg total) by mouth 2 (two) times daily as needed.  60 tablet  0  . Multiple Vitamin (MULITIVITAMIN WITH MINERALS) TABS Take 1 tablet by mouth daily.      . sertraline (ZOLOFT) 50 MG tablet Take 1 tablet (50 mg total) by mouth daily.  30 tablet  0  . tiotropium  (SPIRIVA) 18 MCG inhalation capsule Place 18 mcg into inhaler and inhale daily.      . traMADol (ULTRAM) 50 MG tablet Take 1 tablet (50 mg total) by mouth every 6 (six) hours as needed.  15 tablet  0    Exam:  BP 116/85  Pulse 84  Temp(Src) 97.9 F (36.6 C) (Oral) Gen: Well NAD HEENT: EOMI,  MMM Lungs: Normal work of breathing. CTABL Heart: RRR no MRG Abd: NABS, Soft. NT, ND Exts: Brisk capillary refill, warm and well perfused.  Back: Nontender spinal midline. Tender palpation left SI joint. Lower extremity strength sensation capillary refill are intact. Reflexes are equal bilateral lower extremities. Negative straight leg raise test and Faber test bilaterally. Normal gait.  Results for orders placed during the hospital encounter of 12/19/13 (from the past 24 hour(s))  POCT URINALYSIS DIP (DEVICE)     Status: None   Collection Time    12/19/13  8:21 PM      Result Value Ref Range   Glucose, UA NEGATIVE  NEGATIVE mg/dL   Bilirubin Urine NEGATIVE  NEGATIVE   Ketones, ur NEGATIVE  NEGATIVE mg/dL   Specific Gravity, Urine >=1.030  1.005 - 1.030   Hgb urine dipstick  NEGATIVE  NEGATIVE   pH 5.5  5.0 - 8.0   Protein, ur NEGATIVE  NEGATIVE mg/dL   Urobilinogen, UA 0.2  0.0 - 1.0 mg/dL   Nitrite NEGATIVE  NEGATIVE   Leukocytes, UA NEGATIVE  NEGATIVE   No results found.  Assessment and Plan: 48 y.o. male with low back pain. Secondary to myofascial disruption. Plan to treat with Voltaren Flexeril and small amount of tramadol.  Discussed warning signs or symptoms. Please see discharge instructions. Patient expresses understanding.    Rodolph BongEvan S Cambrie Sonnenfeld, MD 12/19/13 2045

## 2013-12-19 NOTE — ED Notes (Signed)
C/o right side lower back pain which started yesterday Denies any injury or falling States he can barely bend down States back is swelling

## 2013-12-22 ENCOUNTER — Encounter: Payer: Self-pay | Admitting: Cardiovascular Disease

## 2013-12-24 ENCOUNTER — Ambulatory Visit: Payer: Medicaid Other | Admitting: Internal Medicine

## 2013-12-26 ENCOUNTER — Encounter: Payer: Self-pay | Admitting: Cardiovascular Disease

## 2014-01-20 ENCOUNTER — Encounter: Payer: Medicaid Other | Admitting: Physician Assistant

## 2014-01-20 NOTE — Progress Notes (Deleted)
[address] [place], [place]  [address] Phone: [phone] Fax:  [phone]  Date:  01/20/2014   ID:  Angel HackerElwood D Mongiello, DOB 01/22/1966, MRN [id number]  PCP:  Pcp Not In System  Cardiologist:  Dr. Tonny BollmanMichael Cooper   Electrophysiologist:  ***   History of Present Illness: Angel [name] is a 48 y.o. male with a hx of OOH PEA arrest in 2013 in the setting of drug and ETOH abuse.  Initial EF was 25%, but returned to normal.  LHC demonstrated normal coronary arteries.  Event monitor in 2014 was neg for arrhythmias.  Last seen by Dr. Tonny BollmanMichael Cooper in 12/2012.  ***   Studies:  - LHC (***):  ***  - Echo (***):  ***  - Nuclear (***):  ***  - Carotid US (***):  ***   Recent Labs: No results found for requested labs within last 365 days.  Wt Readings from Last 3 Encounters:  11/06/13 170 lb (77.111 kg)  10/15/13 174 lb 3.2 oz (79.017 kg)  09/17/13 175 lb (79.379 kg)     Past Medical History  Diagnosis Date  . History of cardiac arrest     a. 12/2011 PEA arrest in setting of drug/etoh use  . Nonischemic cardiomyopathy     a. 12/2011 Echo: EF 20-25% (in setting of PEA arrest);  b. 01/2012 f/u Echo: EF 60-65%, 01/2012 Myoview: EF 58%, inf/infsept scar w/ anteroseptal/inflat ischemia;  d. 01/2012 Cath: NL Cors, EF 60-65%  . Aspiration pneumonia     a. 12/2011 in setting of PEA arrest  . Polysubstance abuse     a. 12/2011 UDS + for cocaine, THC  . History of respiratory failure     a. 12/2011 in setting of PEA  . History of bacteremia     a. G. coccobacilli 12/2011  . Anoxic encephalopathy     a. 12/2011  . Pulmonary infiltrate in right lung on chest x-ray     a. 01/2012  w/ rec for outpt f/u cxr    Current Outpatient Prescriptions  Medication Sig Dispense Refill  . calcium carbonate (OS-CAL) 600 MG TABS Take 600 mg by mouth 2 (two) times daily with a meal.      . cyclobenzaprine (FLEXERIL) 10 MG tablet Take 1 tablet (10 mg total) by mouth at bedtime as needed for muscle spasms.   20 tablet  0  . diclofenac (VOLTAREN) 75 MG EC tablet Take 1 tablet (75 mg total) by mouth 2 (two) times daily as needed.  60 tablet  0  . Multiple Vitamin (MULITIVITAMIN WITH MINERALS) TABS Take 1 tablet by mouth daily.      . sertraline (ZOLOFT) 50 MG tablet Take 1 tablet (50 mg total) by mouth daily.  30 tablet  0  . tiotropium (SPIRIVA) 18 MCG inhalation capsule Place 18 mcg into inhaler and inhale daily.      . traMADol (ULTRAM) 50 MG tablet Take 1 tablet (50 mg total) by mouth every 6 (six) hours as needed.  15 tablet  0   No current facility-administered medications for this visit.    Allergies:   Review of patient's allergies indicates no known allergies.   Social History:  The patient  reports that he has been smoking Cigarettes.  He has a 35 pack-year smoking history. He has never used smokeless tobacco. He reports that he does not drink alcohol or use illicit drugs.   Family History:  The patient's family history includes Heart disease in  his father.   ROS:  Please see the history of present illness.   ***   All other systems reviewed and negative.   PHYSICAL EXAM: VS:  There were no vitals taken for this visit. Well nourished, well developed, in no acute distress HEENT: normal Neck: ***no JVD Cardiac:  normal S1, S2; ***RRR; no murmur Lungs:  ***clear to auscultation bilaterally, no wheezing, rhonchi or rales Abd: soft, nontender, no hepatomegaly Ext: ***no edema Skin: warm and dry Neuro:  CNs 2-12 intact, no focal abnormalities noted  EKG:  ***     ASSESSMENT AND PLAN:  No diagnosis found. ***  Signed, Tereso NewcomerScott Jannatul Wojdyla, PA-C  01/20/2014 9:01 AM

## 2014-01-22 NOTE — Progress Notes (Signed)
This encounter was created in error - please disregard.

## 2014-02-11 ENCOUNTER — Telehealth: Payer: Self-pay

## 2014-02-11 ENCOUNTER — Encounter: Payer: Self-pay | Admitting: Physician Assistant

## 2014-02-11 NOTE — Telephone Encounter (Signed)
Pt would like to set up an Est. Care Appointment.Pt's [facility] card already has [facility] name on it

## 2014-03-06 ENCOUNTER — Emergency Department (HOSPITAL_COMMUNITY)
Admission: EM | Admit: 2014-03-06 | Discharge: 2014-03-06 | Disposition: A | Discharge status: Home / Self Care | Payer: Medicaid Other | Source: Home / Self Care | Attending: Emergency Medicine | Admitting: Emergency Medicine

## 2014-03-06 ENCOUNTER — Encounter (HOSPITAL_COMMUNITY): Payer: Self-pay | Admitting: Emergency Medicine

## 2014-03-06 ENCOUNTER — Emergency Department (INDEPENDENT_AMBULATORY_CARE_PROVIDER_SITE_OTHER): Payer: Medicaid Other

## 2014-03-06 DIAGNOSIS — M545 Low back pain, unspecified: Secondary | ICD-10-CM

## 2014-03-06 MED ORDER — PREDNISONE 10 MG PO TABS: ORAL_TABLET | ORAL | Status: DC

## 2014-03-06 MED ORDER — HYDROCODONE-ACETAMINOPHEN 5-325 MG PO TABS
2.0000 | ORAL_TABLET | Freq: Once | ORAL | Status: AC
  Administered 2014-03-06: 2 via ORAL

## 2014-03-06 MED ORDER — HYDROCODONE-ACETAMINOPHEN 5-325 MG PO TABS: ORAL_TABLET | ORAL | Status: DC

## 2014-03-06 MED ORDER — METHOCARBAMOL 500 MG PO TABS: 500.0000 mg | ORAL_TABLET | Freq: Three times a day (TID) | ORAL | Status: DC

## 2014-03-06 MED ORDER — HYDROCODONE-ACETAMINOPHEN 5-325 MG PO TABS
ORAL_TABLET | ORAL | Status: AC
  Filled 2014-03-06: qty 2

## 2014-03-06 MED ORDER — MELOXICAM 15 MG PO TABS: 15.0000 mg | ORAL_TABLET | Freq: Every day | ORAL | Status: DC

## 2014-03-06 NOTE — Discharge Instructions (Signed)
Do exercises twice daily followed by moist heat for 15 minutes. ° ° ° ° ° °Try to be as active as possible. ° °If no better in 2 weeks, follow up with orthopedist. ° ° °

## 2014-03-06 NOTE — ED Notes (Signed)
C/o lower back pain States he has hx of back pain but lately pain has increased States he lets his daughter step on his back when he felt a roll and heard three pops States he has tingling in his spine going down hips and legs

## 2014-03-06 NOTE — ED Provider Notes (Signed)
Chief Complaint   Chief Complaint  Patient presents with  . Back Pain    History of Present Illness   Angel [name] is a 48 year old male who has had a many year history of recurring back pain. He notes muscle spasm and tingling in his legs extending down to the knees. He was doing well up until this past Monday, 5 days ago, when he developed a pain in his lower back. He denies any injury to the back. He states he usually has one of his daughters walk on his back when he gets a back pain and this relieves it. This time he had his daughter try that again. This happened this past Wednesday, 3 days ago. He felt like something rolled in his lower back, it popped 3 times, and then the pain became worse. He denies any radiation of the pain down below the knees, numbness in the feet, weakness in the legs, bladder or bowel dysfunction, or saddle anesthesia. He has had no abdominal pain, nausea, or vomiting. He denies any fever, chills, or weight loss.  Review of Systems   Other than as noted above, the patient denies any of the following symptoms: Systemic:  No fever, chills, or unexplained weight loss. GI:  No abdominal painor incontinence of bowel. GU:  No dysuria, frequency, urgency, or hematuria. No incontinence of urine or urinary retention.  M-S:  No neck pain or arthritis. Neuro:  No paresthesias, headache, saddle anesthesia, muscular weakness, or progressive neurological deficit.  PMFSH   Past medical history, family history, social history, meds, and allergies were reviewed. Specifically, there is no history of cancer, major trauma, osteoporosis, immunosuppression, or HIV infection.   Physical Examination    Vital signs:  BP 113/78  Pulse 76  Temp(Src) 98.1 F (36.7 C) (Oral)  Resp 16  SpO2 100% General:  Alert, oriented, in no distress. Abdomen:  Soft, non-tender.  No organomegaly or mass.  No pulsatile midline abdominal mass or bruit. Back:  There is pain to palpation in the  lower lumbar spine and particularly over the sacroiliac joints. His back has a limited range of motion with 20 of forward bending, 10 of backward bending, 15 of lateral bending, and 20 of rotation. Straight leg raising was positive bilaterally with pain only in the sacroiliac areas and none rate and down into the legs. Neuro:  Normal muscle strength, sensations and DTRs. Extremities: Pedal pulses were full, there was no edema. Skin:  Clear, warm and dry.  No rash.  Radiology   Dg Lumbar Spine Complete  03/06/2014   CLINICAL DATA:  Low back pain  EXAM: LUMBAR SPINE - COMPLETE 4+ VIEW  COMPARISON:  None.  FINDINGS: There are 6 nonrib bearing lumbar-type vertebral bodies. The vertebral body heights are maintained. The alignment is anatomic. There is no spondylolysis. There is no acute fracture or static listhesis. The disc spaces are maintained.  The SI joints are unremarkable.  There is abdominal aortic atherosclerosis.  IMPRESSION: Negative.   Electronically Signed   By: Elige KoHetal  Patel   On: 03/06/2014 15:11    Course in [facility]gent [facility] Center   He was given Norco 5/325 2 by mouth for pain.    Assessment   The encounter diagnosis was Bilateral low back pain without sciatica.  No evidence of cauda equina syndrome, discitis, epidural abscess, or aneurism.    Plan     1.  Meds:  The following meds were prescribed:   Discharge Medication List as of 03/06/2014  3:59 PM    START taking these medications   Details  HYDROcodone-acetaminophen (NORCO/VICODIN) 5-325 MG per tablet 1 to 2 tabs every 4 to 6 hours as needed for pain., Print    meloxicam (MOBIC) 15 MG tablet Take 1 tablet (15 mg total) by mouth daily., Starting 03/06/2014, Until Discontinued, Normal    methocarbamol (ROBAXIN) 500 MG tablet Take 1 tablet (500 mg total) by mouth 3 (three) times daily., Starting 03/06/2014, Until Discontinued, Normal    predniSONE (DELTASONE) 10 MG tablet Take 4 tabs daily for 4 days, 3 tabs daily for 4 days, 2  tabs daily for 4 days, then 1 tab daily for 4 days., Normal        2.  Patient Education/Counseling:  The patient was given appropriate handouts, self care instructions, and instructed in symptomatic relief. The patient was encouraged to try to be as active as possible and given some exercises to do followed by moist heat.   3.  Follow up:  The patient was told to follow up here if no better in 3 to 4 days, or sooner if becoming worse in any way, and given some red flag symptoms such as worsening pain or new neurological symptoms which would prompt immediate return.  Follow up with Dr. Durene RomansMatthew Olin if no better in 2 weeks.     Angel Likesavid C Mayci Haning, MD 03/06/14 2039

## 2014-03-11 IMAGING — CT CT HEAD W/O CM
1 series · 16 of 30 positions shown, 20 images · non-contrast
Comparison: Report of prior non digital study 01/03/2000 is
reviewed.

CLINICAL DATA: Ventilated, post CPR, altered mental status

CT HEAD WITHOUT CONTRAST
TECHNIQUE: Contiguous axial images were obtained from the base of
the skull through the vertex without contrast.

[Series 2: head routine 4.8 h37s · axial · 0.45mm/px · z∈[-139,-6]mm · 16 of 30 slices shown, 20 images]
[im 2/30  brain]
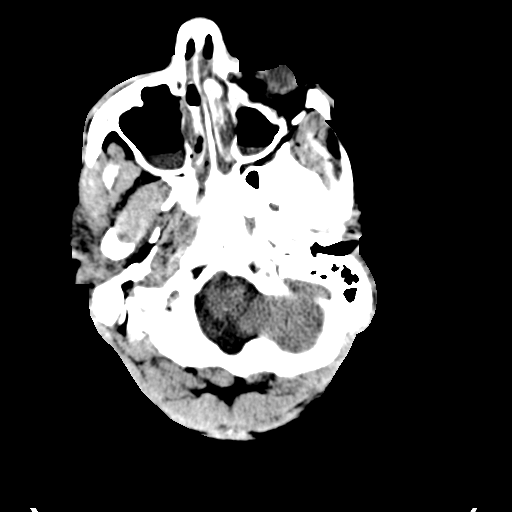
[im 2/30  bone]
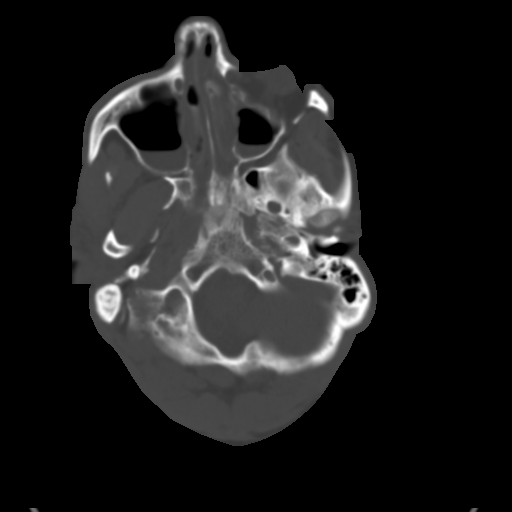
[im 4/30  brain]
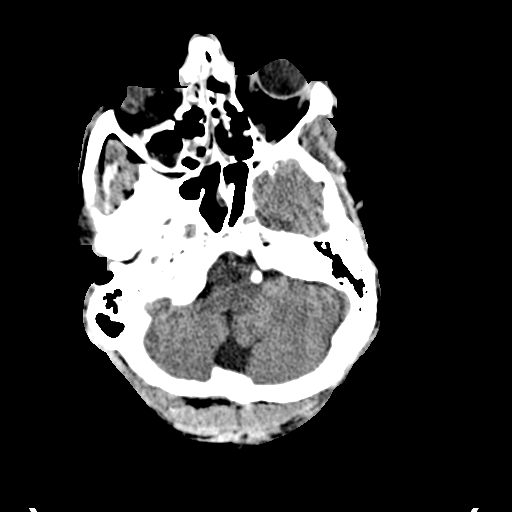
[im 6/30  brain]
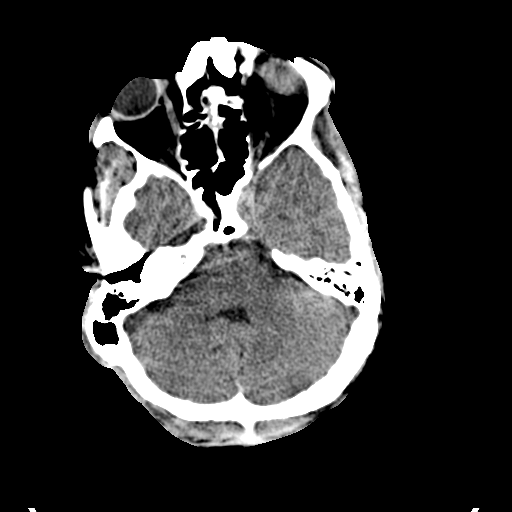
[im 8/30  brain]
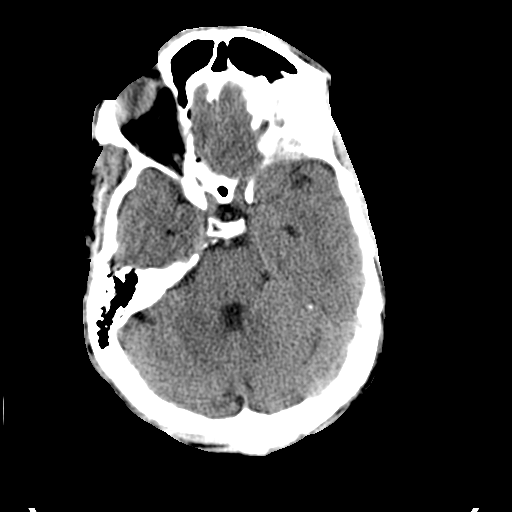
[im 9/30  brain]
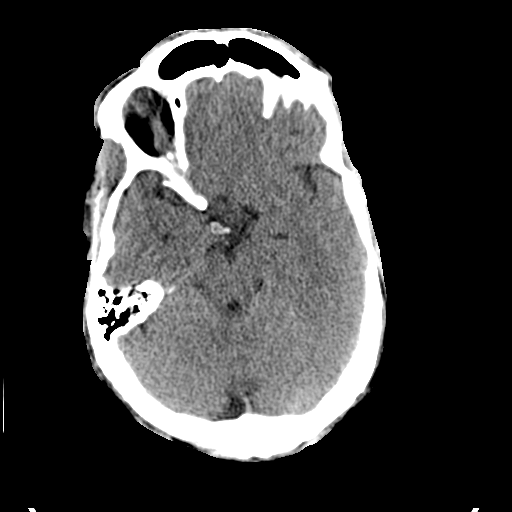
[im 9/30  bone]
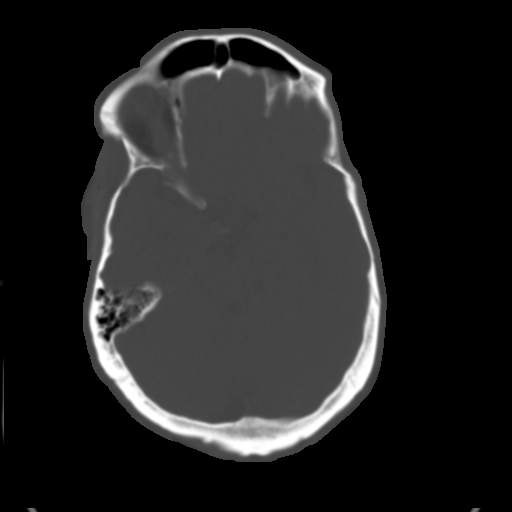
[im 11/30  brain]
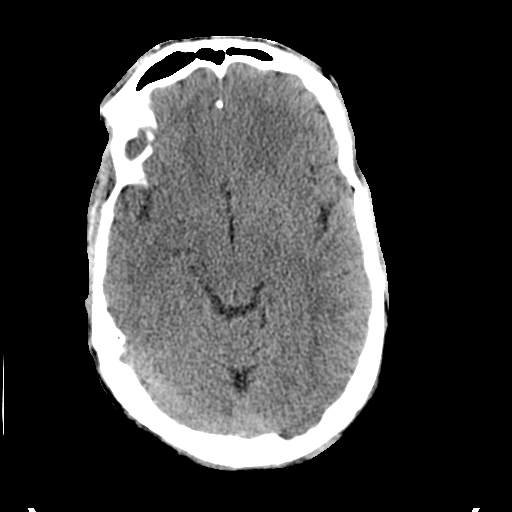
[im 13/30  brain]
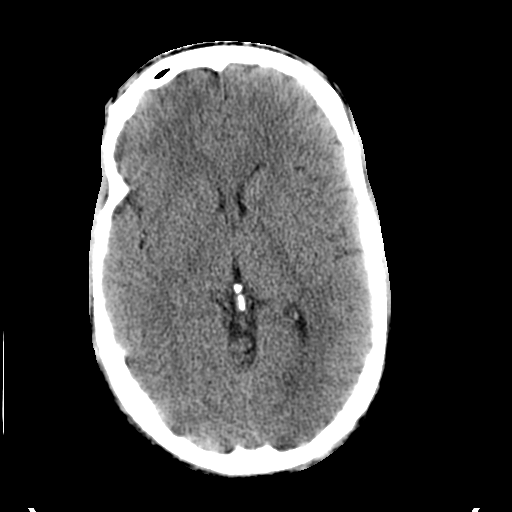
[im 15/30  brain]
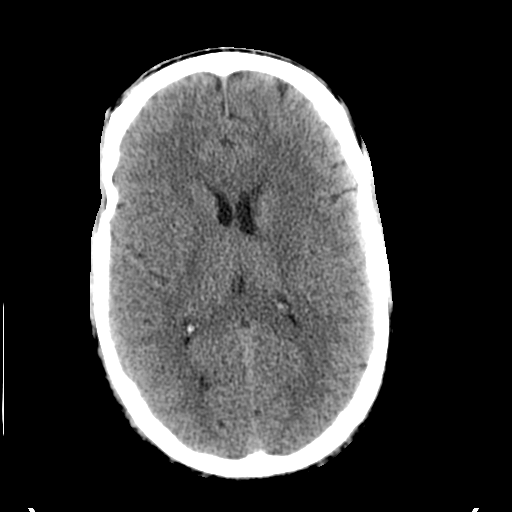
[im 16/30  brain]
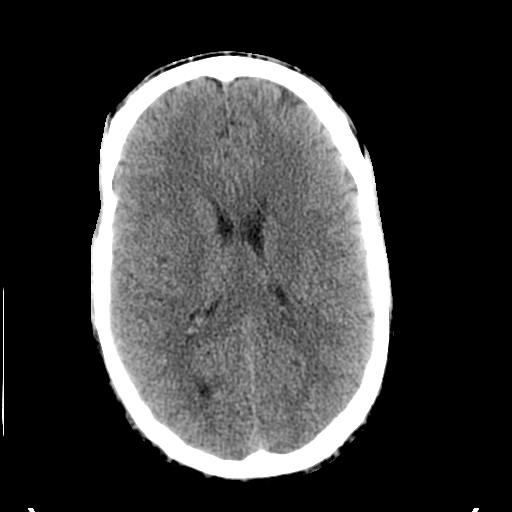
[im 16/30  bone]
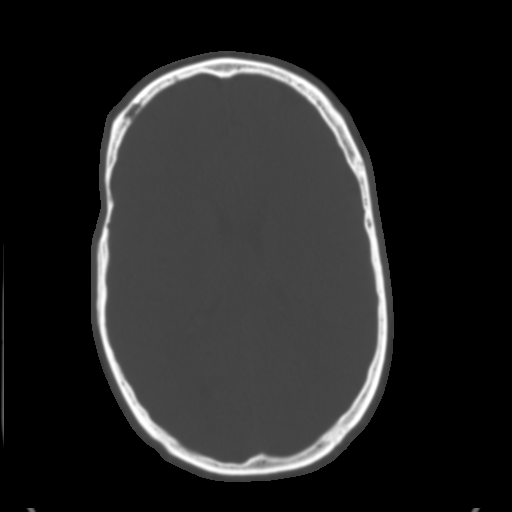
[im 18/30  brain]
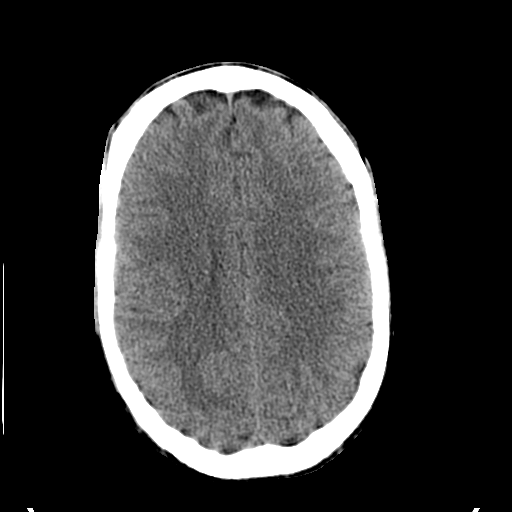
[im 20/30  brain]
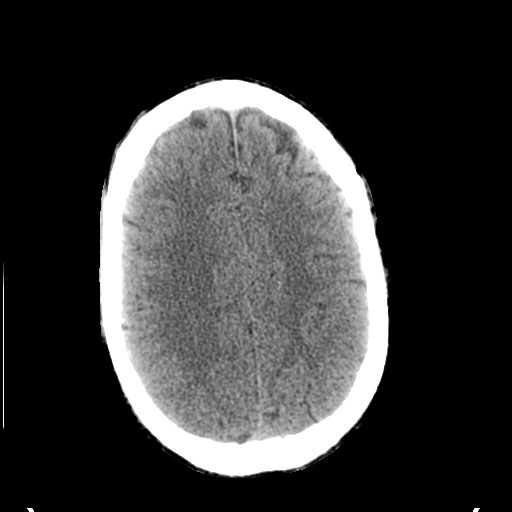
[im 22/30  brain]
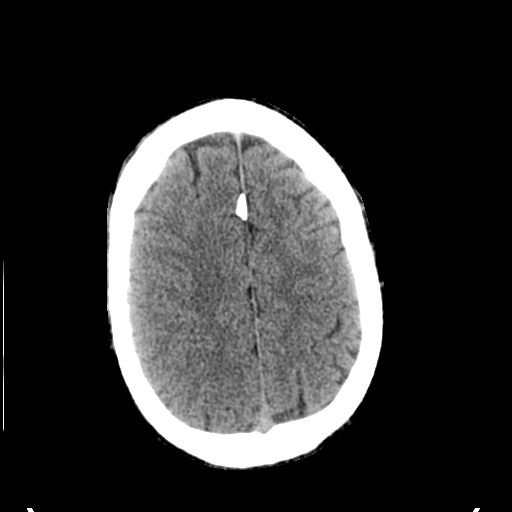
[im 23/30  brain]
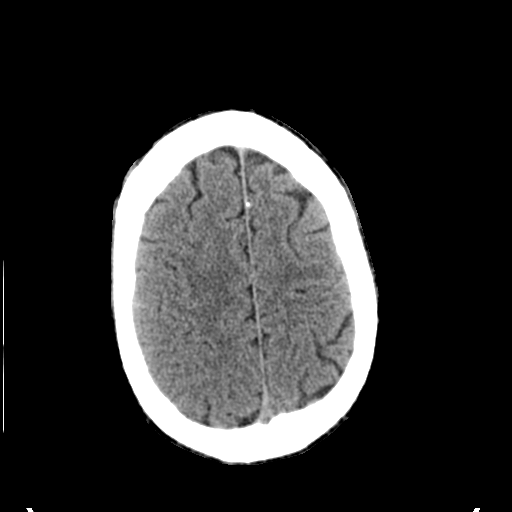
[im 23/30  bone]
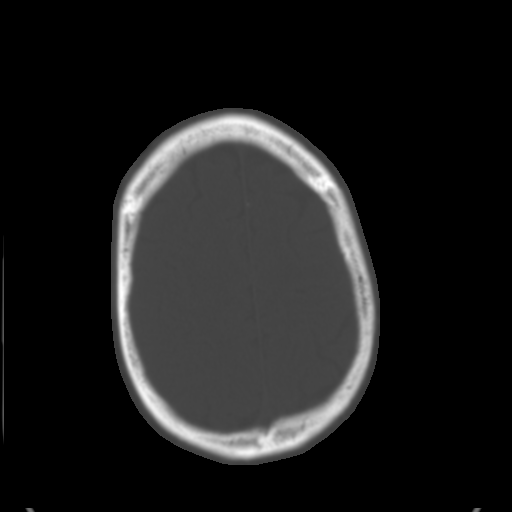
[im 25/30  brain]
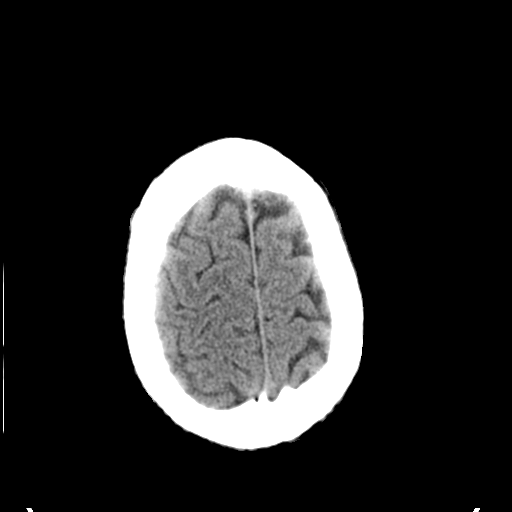
[im 27/30  brain]
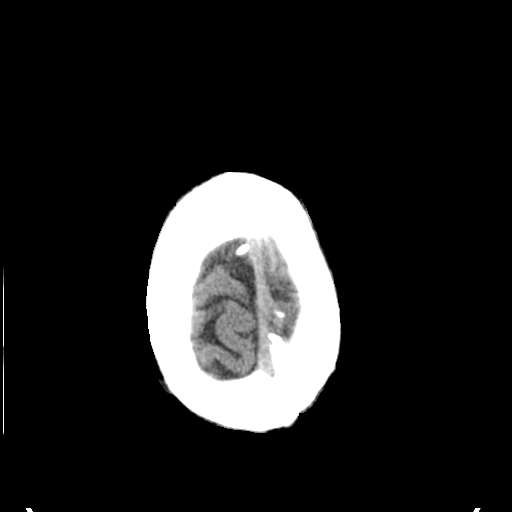
[im 29/30  brain]
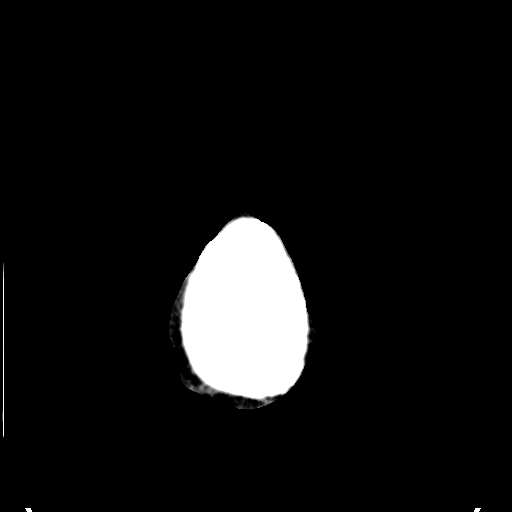

[16 of 30 positions shown; findings below may reference images not displayed]

FINDINGS: No acute hemorrhage, acute infarction, or mass lesion is
identified.  No midline shift.  No ventriculomegaly.  No skull
fracture.  Bilateral maxillary sinus air fluid levels are noted.
Partial opacification of the ethmoid and sphenoid sinuses.  Orbits
are unremarkable.
IMPRESSION: No acute intracranial finding.  Sinusitis.

## 2014-03-11 IMAGING — CR DG CHEST 1V PORT
1 series · 1 of 1 positions shown · non-contrast
Comparison: 12/24/2011 at 5488 hours

CLINICAL DATA: Central line placement.

PORTABLE CHEST - 1 VIEW

[view not recorded]
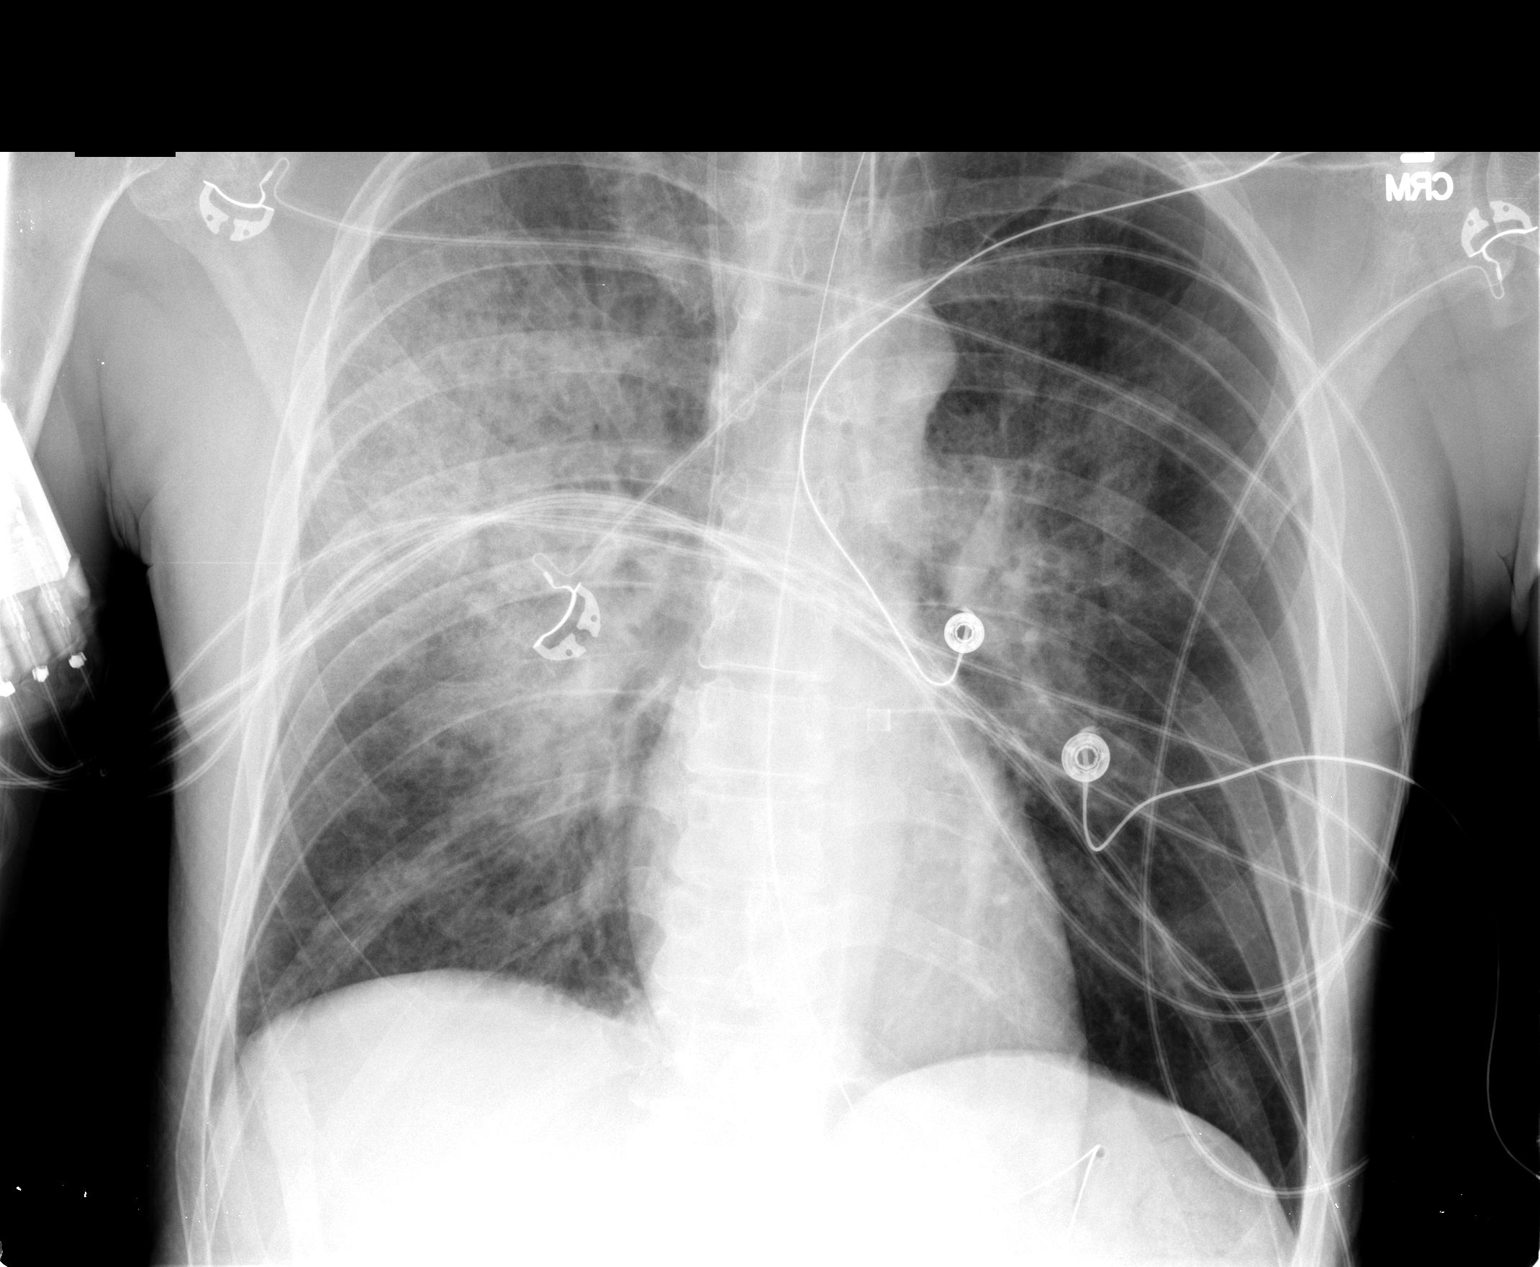

[1 of 1 positions shown; findings below may reference images not displayed]

FINDINGS: Endotracheal tube tip measures about 5.6 cm above the
carina.  Interval placement of right central venous catheter with
tip over the mid SVC region.  No pneumothorax.  Bilateral perihilar
airspace infiltrates with slight improvement since previous study.
No blunting of costophrenic angles.  Normal heart size and
pulmonary vascularity.
IMPRESSION: Right central venous catheter placed with tip over the mid SVC
region.  No pneumothorax.  Endotracheal and enteric tubes remain in
place.  Bilateral perihilar and upper lung airspace disease.

## 2014-03-14 IMAGING — CR DG CHEST 1V PORT
1 series · 1 of 1 positions shown · non-contrast
Comparison: 12/26/2011.

CLINICAL DATA: Endotracheal tube placement. Shortness of breath..

PORTABLE CHEST - 1 VIEW

[AP]
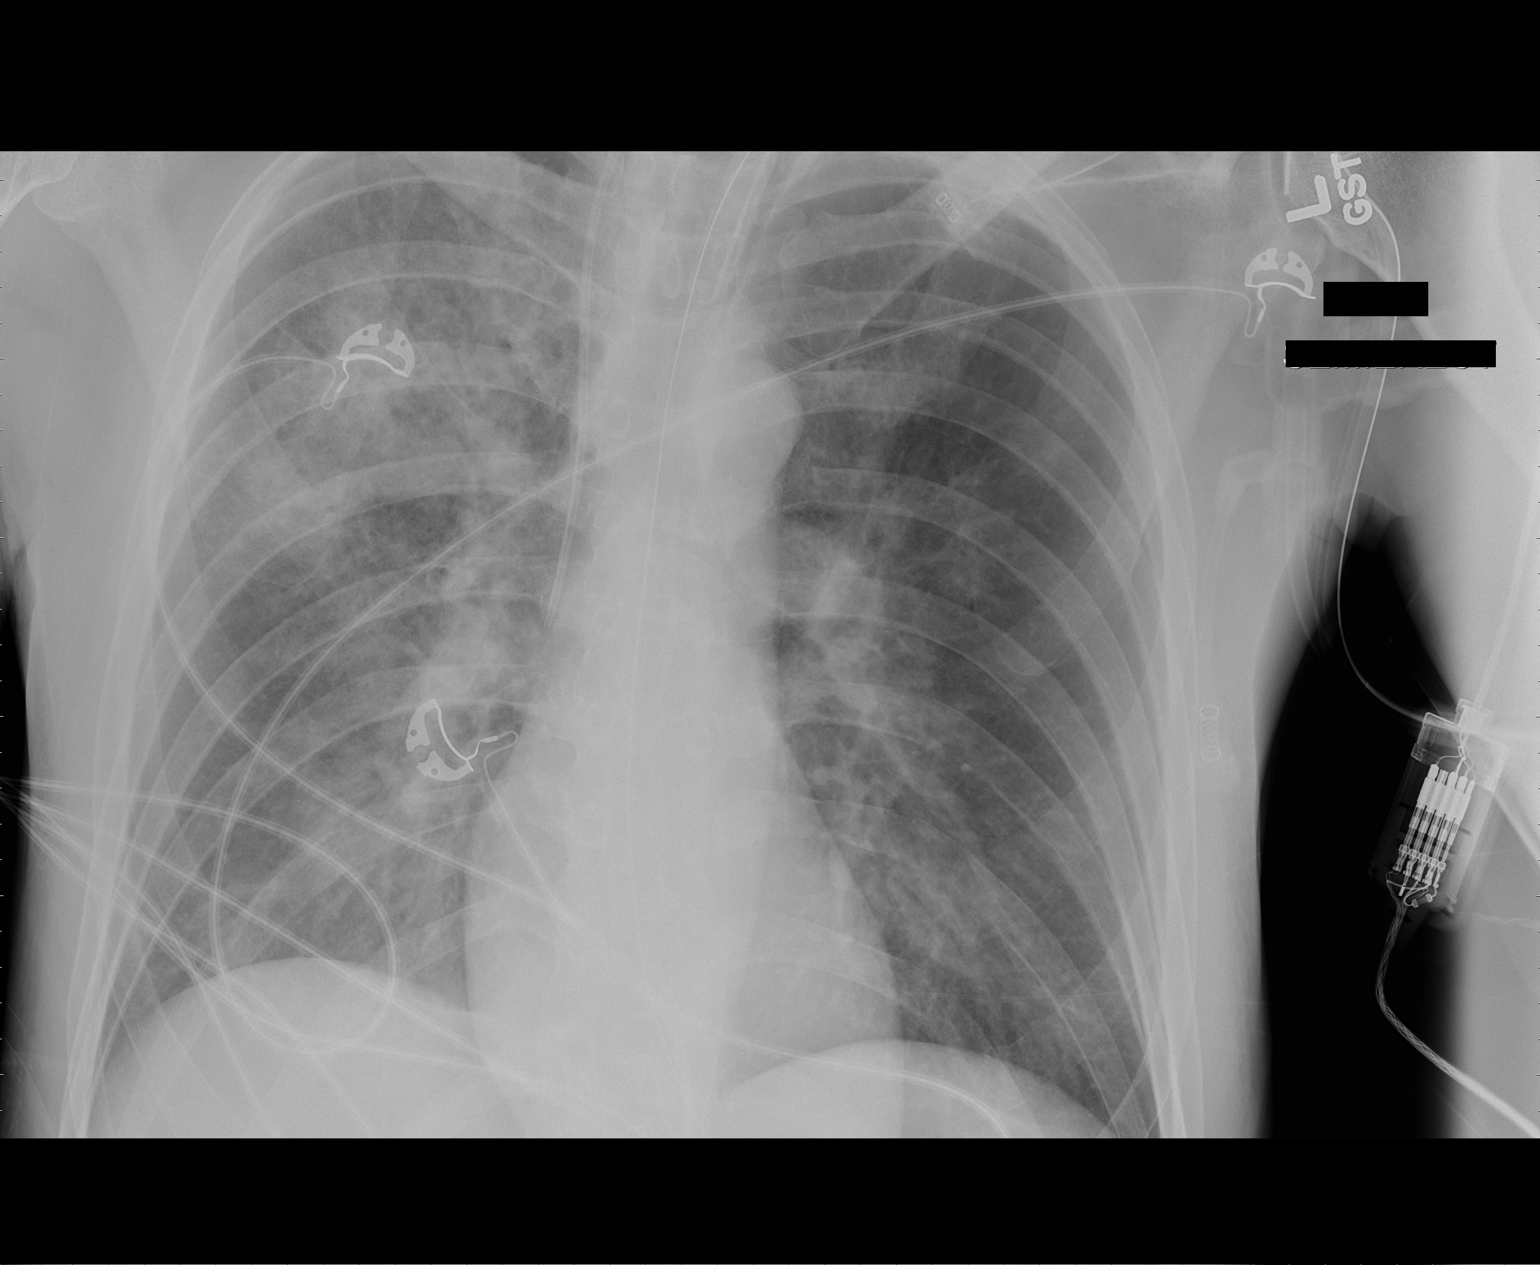

[1 of 1 positions shown; findings below may reference images not displayed]

FINDINGS: Rotation to the left.  Endotracheal tube tip 5.4 cm above
the carina.  Right central line tip mid superior vena cava level.
Nasogastric tube courses below the diaphragm.  The tip is not
included on this exam..

Asymmetric air space disease greater on the right most notable in
the right upper lobe without significant change.  Appearance
suggestive of infectious infiltrate superimposed upon pulmonary
vascular congestion.  Recommend follow-up until clearance.  No
gross pneumothorax.

Heart size within normal limits.
IMPRESSION: Endotracheal tube tip 5.4 cm above the carina.

Asymmetric air space disease without significant change.  Please
see above.

## 2014-03-20 ENCOUNTER — Encounter: Payer: Self-pay | Admitting: Family Medicine

## 2014-03-20 ENCOUNTER — Ambulatory Visit (INDEPENDENT_AMBULATORY_CARE_PROVIDER_SITE_OTHER): Payer: Medicaid Other | Admitting: Family Medicine

## 2014-03-20 VITALS — BP 119/81 | HR 76 | Temp 98.2°F | Resp 20 | Ht 75.0 in | Wt 180.0 lb

## 2014-03-20 DIAGNOSIS — Z23 Encounter for immunization: Secondary | ICD-10-CM | POA: Insufficient documentation

## 2014-03-20 DIAGNOSIS — M25561 Pain in right knee: Secondary | ICD-10-CM | POA: Insufficient documentation

## 2014-03-20 DIAGNOSIS — F1921 Other psychoactive substance dependence, in remission: Secondary | ICD-10-CM

## 2014-03-20 DIAGNOSIS — M62838 Other muscle spasm: Secondary | ICD-10-CM

## 2014-03-20 DIAGNOSIS — F17200 Nicotine dependence, unspecified, uncomplicated: Secondary | ICD-10-CM | POA: Diagnosis not present

## 2014-03-20 DIAGNOSIS — M25569 Pain in unspecified knee: Secondary | ICD-10-CM

## 2014-03-20 DIAGNOSIS — R5383 Other fatigue: Secondary | ICD-10-CM

## 2014-03-20 DIAGNOSIS — M25562 Pain in left knee: Secondary | ICD-10-CM | POA: Insufficient documentation

## 2014-03-20 DIAGNOSIS — R5381 Other malaise: Secondary | ICD-10-CM | POA: Insufficient documentation

## 2014-03-20 LAB — CBC WITH DIFFERENTIAL/PLATELET
BASOS ABS: 0.1 10*3/uL (ref 0.0–0.1)
Basophils Relative: 1 % (ref 0–1)
EOS PCT: 5 % (ref 0–5)
Eosinophils Absolute: 0.4 10*3/uL (ref 0.0–0.7)
HCT: 44.7 % (ref 39.0–52.0)
Hemoglobin: 15.7 g/dL (ref 13.0–17.0)
LYMPHS ABS: 2.9 10*3/uL (ref 0.7–4.0)
Lymphocytes Relative: 34 % (ref 12–46)
MCH: 32.5 pg (ref 26.0–34.0)
MCHC: 35.1 g/dL (ref 30.0–36.0)
MCV: 92.5 fL (ref 78.0–100.0)
Monocytes Absolute: 0.6 10*3/uL (ref 0.1–1.0)
Monocytes Relative: 7 % (ref 3–12)
NEUTROS PCT: 53 % (ref 43–77)
Neutro Abs: 4.5 10*3/uL (ref 1.7–7.7)
PLATELETS: 303 10*3/uL (ref 150–400)
RBC: 4.83 MIL/uL (ref 4.22–5.81)
RDW: 14.6 % (ref 11.5–15.5)
WBC: 8.4 10*3/uL (ref 4.0–10.5)

## 2014-03-20 LAB — COMPREHENSIVE METABOLIC PANEL
ALT: 18 U/L (ref 0–53)
AST: 18 U/L (ref 0–37)
Albumin: 4.4 g/dL (ref 3.5–5.2)
Alkaline Phosphatase: 60 U/L (ref 39–117)
BILIRUBIN TOTAL: 0.2 mg/dL (ref 0.2–1.2)
BUN: 23 mg/dL (ref 6–23)
CO2: 25 meq/L (ref 19–32)
CREATININE: 1.1 mg/dL (ref 0.50–1.35)
Calcium: 8.9 mg/dL (ref 8.4–10.5)
Chloride: 104 mEq/L (ref 96–112)
Glucose, Bld: 81 mg/dL (ref 70–99)
Potassium: 4 mEq/L (ref 3.5–5.3)
SODIUM: 139 meq/L (ref 135–145)
TOTAL PROTEIN: 6.7 g/dL (ref 6.0–8.3)

## 2014-03-20 IMAGING — CR DG CHEST 1V PORT
1 series · 1 of 1 positions shown · non-contrast
Comparison: Chest x-ray 01/01/2012.

CLINICAL DATA: Assess endotracheal tube placement.

PORTABLE CHEST - 1 VIEW

[AP]
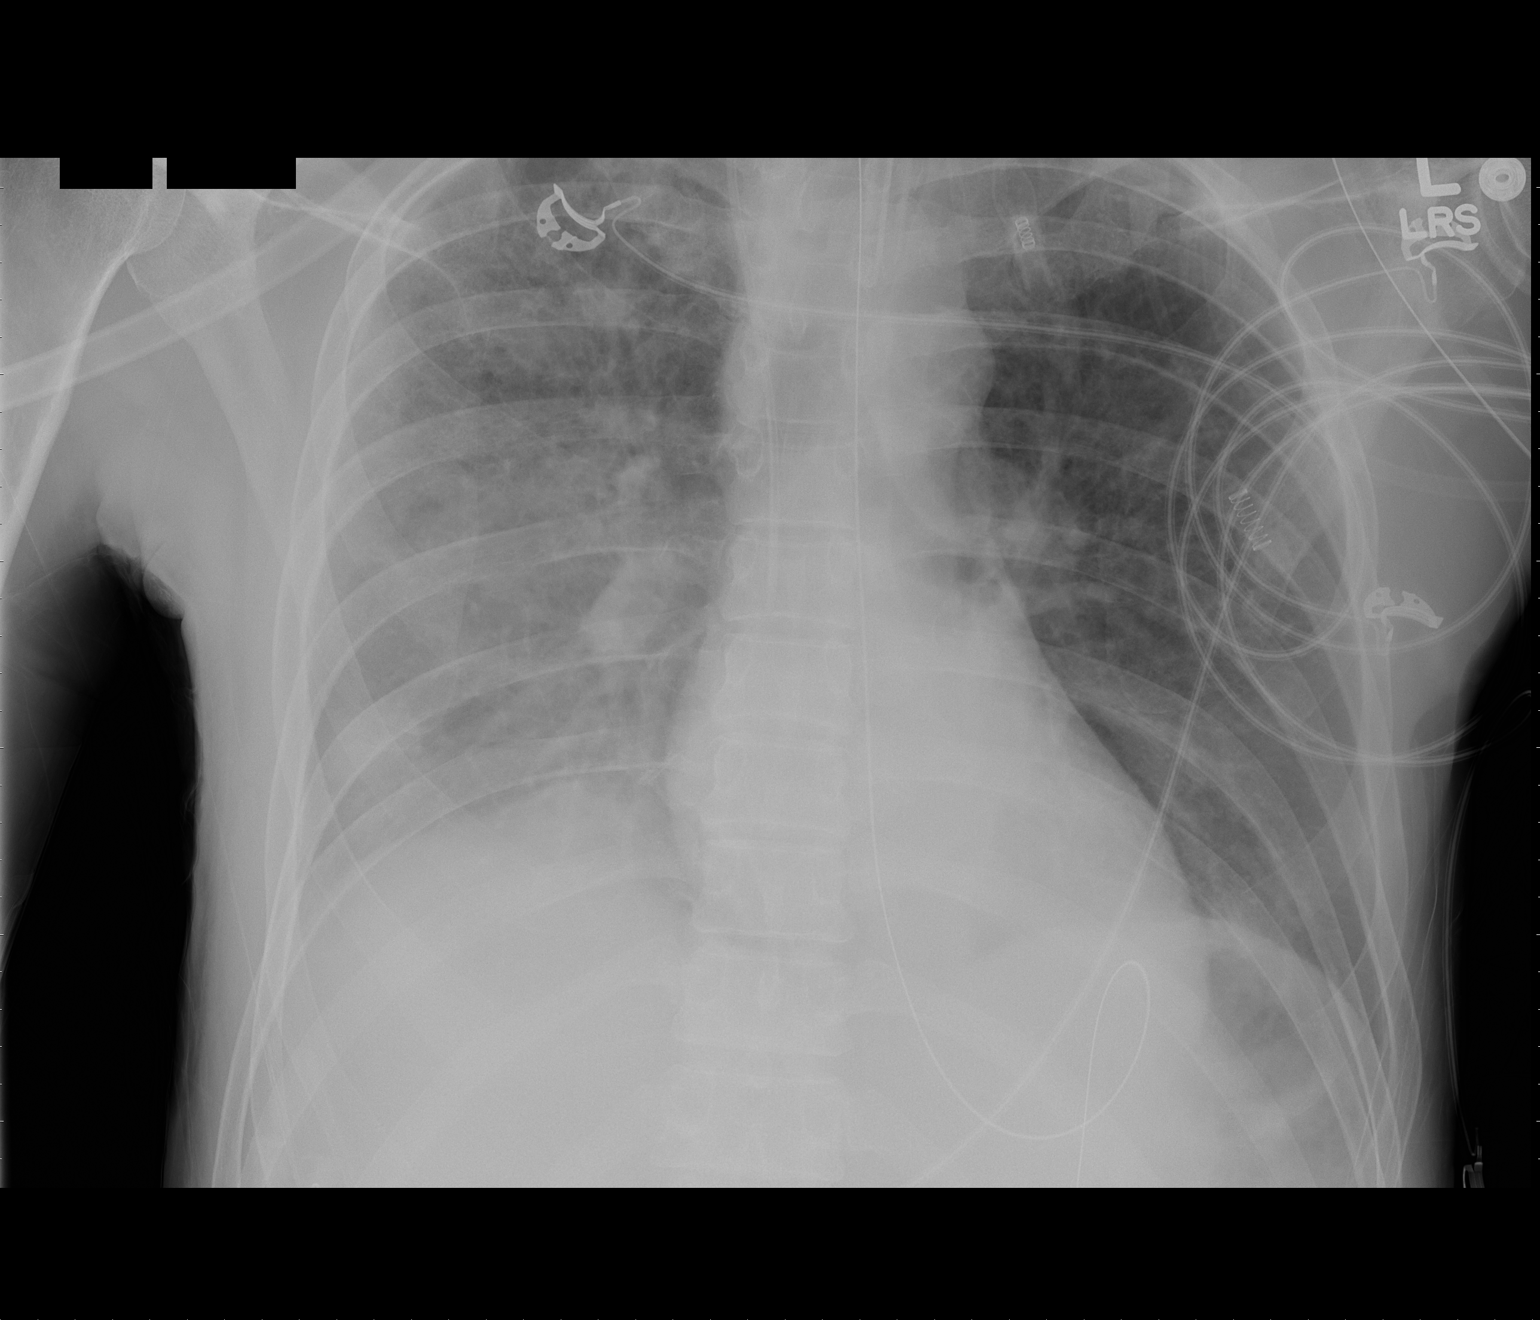

[1 of 1 positions shown; findings below may reference images not displayed]

FINDINGS: An endotracheal tube is in place with tip 4.3 cm above
the carina. There is a right-sided internal jugular central venous
catheter with tip terminating in the distal superior vena cava.  A
nasogastric tube is seen extending into the stomach.  There
continues to be extensive airspace consolidation throughout the
right lung, with a moderate right-sided posterior layering pleural
effusion.  The left lung appears relatively clear with some mild
perihilar vascular prominence.  Heart size is normal.  Mediastinal
contours are unremarkable.
IMPRESSION: 1.  Support apparatus, as above.
2.  Persistent right-sided pneumonia with moderate posterior
layering right-sided pleural effusion.
3.  Mild pulmonary vascular congestion again noted.

## 2014-03-20 MED ORDER — METHOCARBAMOL 500 MG PO TABS: 500.0000 mg | ORAL_TABLET | Freq: Three times a day (TID) | ORAL | Status: DC

## 2014-03-20 NOTE — Progress Notes (Signed)
Subjective:    Patient ID: Angel [name], male    DOB: 06/12/1966, 48 y.o.   MRN: [id number]  HPI Angel [name] presents for transition into care. Patient was being seen by Dr. Lerry Linerwight Williams prior to establishing care.   Patient complain of chronic right knee pain that occurs primarily with with bending, lifting, and twisting. Pain intensity is currently  7/10 described as sharp, and constant to the patellar aspect of knees. He states that pain is aggravated by activity and alleviated By Tramadol, last taken on yesterday with moderate relief.   Patient reports a history of drug abuse. He states that had an overdose of Methodone 2 years ago that led to intubation.   Angel [name] reports that he has been smoking 1 pack of cigarettes per day for many years. He states that he attempted to quit previously with Chantix. He reports that he had to discontinue medication regimen due to vivid dreams. Patient reports that he has attempted to quit on several occasions without success. Patient denies chronic cough, weight loss, weakness, and shortness of breath.   Patient also complains of fatigue. Symptoms began several weeks ago. Sentinal symptom the patient feels fatigue began with: increased pain to knees. . Symptoms of his fatigue have been diffuse soft tissue aches and pains. Patient describes the following psychologic symptoms: none.  Patient denies fever, significant change in weight, symptoms of arthritis, unusual rashes, GI blood loss and witnessed or suspected sleep apnea. Symptoms have been intermittent and mild.   Review of Systems  Constitutional: Negative.   HENT: Negative.   Eyes: Negative.   Respiratory: Negative for apnea, cough and shortness of breath.   Cardiovascular: Negative.   Gastrointestinal: Negative for nausea, diarrhea, constipation, blood in stool and rectal pain.  Endocrine: Negative.   Musculoskeletal: Positive for joint swelling (chronic right knee pain) and myalgias.  Skin:  Negative.   Allergic/Immunologic: Negative.   Neurological: Negative.  Negative for dizziness.  Hematological: Negative.   Psychiatric/Behavioral: Positive for decreased concentration and agitation. The patient is nervous/anxious.        Objective:   Physical Exam  Constitutional: He is oriented to person, place, and time. He appears well-developed and well-nourished.  HENT:  Head: Normocephalic and atraumatic.  Right Ear: External ear normal.  Left Ear: External ear normal.  Mouth/Throat: Oropharynx is clear and moist.  Eyes: Conjunctivae and EOM are normal. Pupils are equal, round, and reactive to light.  Neck: Normal range of motion. Neck supple.  Cardiovascular: Normal rate, regular rhythm and normal heart sounds.   Pulmonary/Chest: Effort normal and breath sounds normal.  Abdominal: Soft. Bowel sounds are normal.  Musculoskeletal: Normal range of motion.       Right shoulder: He exhibits normal range of motion.  Neurological: He is alert and oriented to person, place, and time. He has normal reflexes.  Psychiatric: He has a normal mood and affect. His behavior is normal. Judgment and thought content normal.      BP 119/81  Pulse 76  Temp(Src) 98.2 F (36.8 C) (Oral)  Resp 20  Ht 6\' 3"  (1.905 m)  Wt 180 lb (81.647 kg)  BMI 22.50 kg/m2    Assessment & Plan:   1. Fatigue- Patient reports that he has been tired inspite of receiving 6-8 hours of sleep. Will check CMP and CBC  2. Chronic knee pain- Patient was previously followed by Dr. Lerry Linerwight Williams, who  has been prescribing Tramadol/ 90 pills per month. Patient to notify Dr.  Williams office for last 30 days of medications prior to transition of care. Patient states that Mobic and Meloxicam have not worked well for him in the past. Will refer to pain management clinic.   3. Tobacco dependence- Patient smoking 1 pack per day. Patient reports that he failed Chantix in the past due to vivid dreams. Discussed established.  [place] [facility].  4. History of drug dependence and past overdose- He reports that he overdosed on Methadone 2 years ago and was in the ICU for 2 weeks. He states that he has not had issues with narcotic medications since this time.  5 History of emphysema- Controlled on current medication regimen. Patient to continue Spiriva a previously prescribed.   6. Depression: Controlled on Zoloft 50 mg daily. Patient denies suicidal or homicidal ideations.    Will review records as they come available.   Lipids,  CMP, CBC (future orders prior to CPE). Colonoscopy- Will address next year Immunization: Tdap, pneumovax received without complication Sexually active: No condoms. Encouraged to use condoms with sexual encounters   BP 119/81  Pulse 76  Temp(Src) 98.2 F (36.8 C) (Oral)  Resp 20  Ht 6\' 3"  (1.905 m)  Wt 180 lb (81.647 kg)  BMI 22.50 kg/m2

## 2014-03-21 ENCOUNTER — Emergency Department (HOSPITAL_COMMUNITY)
Admission: EM | Admit: 2014-03-21 | Discharge: 2014-03-21 | Disposition: A | Discharge status: Home / Self Care | Payer: Medicaid Other | Source: Home / Self Care | Attending: Emergency Medicine | Admitting: Emergency Medicine

## 2014-03-21 ENCOUNTER — Encounter (HOSPITAL_COMMUNITY): Payer: Self-pay | Admitting: Emergency Medicine

## 2014-03-21 DIAGNOSIS — G8929 Other chronic pain: Secondary | ICD-10-CM

## 2014-03-21 MED ORDER — MELOXICAM 7.5 MG PO TABS: 7.5000 mg | ORAL_TABLET | Freq: Every day | ORAL | Status: DC

## 2014-03-21 NOTE — ED Provider Notes (Signed)
CSN: [id number]     Arrival date & time 03/21/14  1020 History   First MD Initiated Contact with Patient 03/21/14 1127     Chief Complaint  Patient presents with  . Knee Pain   (Consider location/radiation/quality/duration/timing/severity/associated sxs/prior Treatment) HPI Comments: Patient presents requesting refill of medication he takes for chronic pain (Tramadol). States he has run out of this medication and he cannot see his usual prescribed until next week. States he takes this medication for chronic bilateral knee pain. Denies new/recent injury Does not report hx of gout Unemployed  The history is provided by the patient.    Past Medical History  Diagnosis Date  . History of cardiac arrest     a. 12/2011 PEA arrest in setting of drug/etoh use  . Nonischemic cardiomyopathy     a. 12/2011 Echo: EF 20-25% (in setting of PEA arrest);  b. 01/2012 f/u Echo: EF 60-65%, 01/2012 Myoview: EF 58%, inf/infsept scar w/ anteroseptal/inflat ischemia;  d. 01/2012 Cath: NL Cors, EF 60-65%  . Aspiration pneumonia     a. 12/2011 in setting of PEA arrest  . Polysubstance abuse     a. 12/2011 UDS + for cocaine, THC  . History of respiratory failure     a. 12/2011 in setting of PEA  . History of bacteremia     a. G. coccobacilli 12/2011  . Anoxic encephalopathy     a. 12/2011  . Pulmonary infiltrate in right lung on chest x-ray     a. 01/2012  w/ rec for outpt f/u cxr   Past Surgical History  Procedure Laterality Date  . Cardiac catheterization  5/13    left heart with angiogram   Family History  Problem Relation Age of Onset  . Heart disease Father    History  Substance Use Topics  . Smoking status: Current Every Day Smoker -- 1.00 packs/day for 35 years    Types: Cigarettes  . Smokeless tobacco: Never Used  . Alcohol Use: No    Review of Systems  All other systems reviewed and are negative.   Allergies  Review of patient's allergies indicates no known allergies.  Home Medications    Prior to Admission medications   Medication Sig Start Date End Date Taking? Authorizing Provider  calcium carbonate (OS-CAL) 600 MG TABS Take 600 mg by mouth 2 (two) times daily with a meal.    Historical Provider, MD  cyclobenzaprine (FLEXERIL) 10 MG tablet Take 1 tablet (10 mg total) by mouth at bedtime as needed for muscle spasms. 12/19/13   Rodolph BongEvan S Corey, MD  diclofenac (VOLTAREN) 75 MG EC tablet Take 1 tablet (75 mg total) by mouth 2 (two) times daily as needed. 12/19/13   Rodolph BongEvan S Corey, MD  meloxicam (MOBIC) 15 MG tablet Take 1 tablet (15 mg total) by mouth daily. 03/06/14   Reuben Likesavid C Keller, MD  meloxicam (MOBIC) 7.5 MG tablet Take 1 tablet (7.5 mg total) by mouth daily. 03/21/14   Ardis RowanJennifer Lee Makaiyah Schweiger, PA  methocarbamol (ROBAXIN) 500 MG tablet Take 1 tablet (500 mg total) by mouth 3 (three) times daily. 03/20/14   Massie MaroonLachina M Hollis, FNP  Multiple Vitamin (MULITIVITAMIN WITH MINERALS) TABS Take 1 tablet by mouth daily.    Historical Provider, MD  predniSONE (DELTASONE) 10 MG tablet Take 4 tabs daily for 4 days, 3 tabs daily for 4 days, 2 tabs daily for 4 days, then 1 tab daily for 4 days. 03/06/14   Reuben Likesavid C Keller, MD  sertraline (ZOLOFT) 50  MG tablet Take 1 tablet (50 mg total) by mouth daily. 10/09/12   Dyann Kief, PA-C  tiotropium (SPIRIVA) 18 MCG inhalation capsule Place 18 mcg into inhaler and inhale daily.    Historical Provider, MD  traMADol (ULTRAM) 50 MG tablet Take 1 tablet (50 mg total) by mouth every 6 (six) hours as needed. 12/19/13   Rodolph Bong, MD   BP 124/82  Pulse 80  Temp(Src) 97.6 F (36.4 C) (Oral)  Resp 16  SpO2 98% Physical Exam  Nursing note and vitals reviewed. Constitutional: He is oriented to person, place, and time. He appears well-developed and well-nourished.  HENT:  Head: Normocephalic and atraumatic.  Eyes: Conjunctivae are normal. No scleral icterus.  Cardiovascular: Normal rate, regular rhythm and normal heart sounds.   Pulmonary/Chest: Effort normal and  breath sounds normal.  Musculoskeletal: Normal range of motion.       Right knee: Normal.       Left knee: Normal.  Neurological: He is alert and oriented to person, place, and time.  Skin: Skin is warm and dry. No rash noted. No erythema.  Psychiatric: He has a normal mood and affect. His behavior is normal.    ED Course  Procedures (including critical care time) Labs Review Labs Reviewed - No data to display  Imaging Review No results found.   MDM   1. Chronic pain    Available old records reviewed. Patient was seen for same by his PCP yesterday and request for Tramadol Rx refill declined and patient was referred to Pain Management. Also has a documented history of polysubstance abuse and overdose resulting in cardiac arrest. Explained to patient that I would not be providing him with a Tramadol Rx refill and he is advised to follow up with pain management as instructed yesterday.    Jess Barters [place], [place] 03/21/14 1309

## 2014-03-21 NOTE — Discharge Instructions (Signed)

## 2014-03-21 NOTE — ED Notes (Addendum)
PT  PRESENTS  C/O  CHRONIC  KNEE  PAIN   HE  PRESENTS  WITH AN  EMPTY  BOTTLE  OF TRAMMADOL       HE  STATES  DR Mayford KnifeWILLIAMS  IS OUT  SICK      PT   SEEN  YESTERDAY      BY  PCP  AND  WAS  TOLD  HE  NEEDED  TO BE  SEEN  AT THE  PAIN CLINIC

## 2014-03-22 NOTE — ED Provider Notes (Signed)
Medical screening examination/treatment/procedure(s) were performed by non-physician practitioner and as supervising physician I was immediately available for consultation/collaboration.  Daniqua Campoy, M.D.  Seraya Jobst C Julya Alioto, MD 03/22/14 0914 

## 2014-03-24 ENCOUNTER — Encounter (HOSPITAL_COMMUNITY): Payer: Self-pay | Admitting: Emergency Medicine

## 2014-03-24 ENCOUNTER — Emergency Department (HOSPITAL_COMMUNITY)
Admission: EM | Admit: 2014-03-24 | Discharge: 2014-03-24 | Disposition: A | Discharge status: Home / Self Care | Payer: Medicaid Other | Attending: Emergency Medicine | Admitting: Emergency Medicine

## 2014-03-24 DIAGNOSIS — Z8679 Personal history of other diseases of the circulatory system: Secondary | ICD-10-CM | POA: Insufficient documentation

## 2014-03-24 DIAGNOSIS — IMO0002 Reserved for concepts with insufficient information to code with codable children: Secondary | ICD-10-CM | POA: Insufficient documentation

## 2014-03-24 DIAGNOSIS — Z79899 Other long term (current) drug therapy: Secondary | ICD-10-CM | POA: Diagnosis not present

## 2014-03-24 DIAGNOSIS — Z8701 Personal history of pneumonia (recurrent): Secondary | ICD-10-CM | POA: Insufficient documentation

## 2014-03-24 DIAGNOSIS — G8929 Other chronic pain: Secondary | ICD-10-CM | POA: Diagnosis not present

## 2014-03-24 DIAGNOSIS — M549 Dorsalgia, unspecified: Secondary | ICD-10-CM | POA: Diagnosis not present

## 2014-03-24 DIAGNOSIS — M25569 Pain in unspecified knee: Secondary | ICD-10-CM | POA: Diagnosis not present

## 2014-03-24 DIAGNOSIS — Z9889 Other specified postprocedural states: Secondary | ICD-10-CM | POA: Insufficient documentation

## 2014-03-24 DIAGNOSIS — F17200 Nicotine dependence, unspecified, uncomplicated: Secondary | ICD-10-CM | POA: Diagnosis not present

## 2014-03-24 DIAGNOSIS — Z8619 Personal history of other infectious and parasitic diseases: Secondary | ICD-10-CM | POA: Diagnosis not present

## 2014-03-24 DIAGNOSIS — Z8709 Personal history of other diseases of the respiratory system: Secondary | ICD-10-CM | POA: Diagnosis not present

## 2014-03-24 DIAGNOSIS — Z8669 Personal history of other diseases of the nervous system and sense organs: Secondary | ICD-10-CM | POA: Diagnosis not present

## 2014-03-24 DIAGNOSIS — Z8674 Personal history of sudden cardiac arrest: Secondary | ICD-10-CM | POA: Diagnosis not present

## 2014-03-24 DIAGNOSIS — Z791 Long term (current) use of non-steroidal anti-inflammatories (NSAID): Secondary | ICD-10-CM | POA: Insufficient documentation

## 2014-03-24 NOTE — ED Notes (Signed)
Patient states he is here "for Tramadol".  Patient states that he has chronic back pain and chronic bilateral knee pain.  Patient states he is trying to get another primary doctor.

## 2014-03-24 NOTE — ED Provider Notes (Signed)
CSN: [id number]     Arrival date & time 03/24/14  1121 History  This chart was scribed for non-physician practitioner Mellody DrownLauren Demitrius Crass working with Ward GivensIva L Knapp, MD by Carl Bestelina Holson, ED Scribe. This patient was seen in room TR11C/TR11C and the patient's care was started at 11:31 AM.    No chief complaint on file.  HPI Comments: Angel [name] is a 48 y.o. male with a history of polysubstance abuse and overdose who presents to the Emergency Department complaining of constant back pain and bilateral knee pain that started years ago.  The patient denies any recent injury or new symptoms.  He states that he has been prescribed Tramadol by a physician.  He states that he last took Tramadol yesterday and he normally takes 2 pills daily.  He states that he will experience complete relief to his symptoms when he takes Tramadol and would like to have his prescription refilled.  The patient states that he has been trying to find a new PCP.     The history is provided by the patient. No language interpreter was used.    Past Medical History  Diagnosis Date  . History of cardiac arrest     a. 12/2011 PEA arrest in setting of drug/etoh use  . Nonischemic cardiomyopathy     a. 12/2011 Echo: EF 20-25% (in setting of PEA arrest);  b. 01/2012 f/u Echo: EF 60-65%, 01/2012 Myoview: EF 58%, inf/infsept scar w/ anteroseptal/inflat ischemia;  d. 01/2012 Cath: NL Cors, EF 60-65%  . Aspiration pneumonia     a. 12/2011 in setting of PEA arrest  . Polysubstance abuse     a. 12/2011 UDS + for cocaine, THC  . History of respiratory failure     a. 12/2011 in setting of PEA  . History of bacteremia     a. G. coccobacilli 12/2011  . Anoxic encephalopathy     a. 12/2011  . Pulmonary infiltrate in right lung on chest x-ray     a. 01/2012  w/ rec for outpt f/u cxr   Past Surgical History  Procedure Laterality Date  . Cardiac catheterization  5/13    left heart with angiogram   Family History  Problem Relation Age of Onset  .  Heart disease Father    History  Substance Use Topics  . Smoking status: Current Every Day Smoker -- 1.00 packs/day for 35 years    Types: Cigarettes  . Smokeless tobacco: Never Used  . Alcohol Use: No    Review of Systems  Cardiovascular: Negative for leg swelling.  Musculoskeletal: Positive for arthralgias and back pain. Negative for gait problem, joint swelling and myalgias.  All other systems reviewed and are negative.     Allergies  Review of patient's allergies indicates no known allergies.  Home Medications   Prior to Admission medications   Medication Sig Start Date End Date Taking? Authorizing Provider  calcium carbonate (OS-CAL) 600 MG TABS Take 600 mg by mouth 2 (two) times daily with a meal.    Historical Provider, MD  cyclobenzaprine (FLEXERIL) 10 MG tablet Take 1 tablet (10 mg total) by mouth at bedtime as needed for muscle spasms. 12/19/13   Rodolph BongEvan S Corey, MD  diclofenac (VOLTAREN) 75 MG EC tablet Take 1 tablet (75 mg total) by mouth 2 (two) times daily as needed. 12/19/13   Rodolph BongEvan S Corey, MD  meloxicam (MOBIC) 15 MG tablet Take 1 tablet (15 mg total) by mouth daily. 03/06/14   Reuben Likesavid C Keller, MD  meloxicam (MOBIC) 7.5 MG tablet Take 1 tablet (7.5 mg total) by mouth daily. 03/21/14   Ardis Rowan, PA  methocarbamol (ROBAXIN) 500 MG tablet Take 1 tablet (500 mg total) by mouth 3 (three) times daily. 03/20/14   Massie Maroon, FNP  Multiple Vitamin (MULITIVITAMIN WITH MINERALS) TABS Take 1 tablet by mouth daily.    Historical Provider, MD  predniSONE (DELTASONE) 10 MG tablet Take 4 tabs daily for 4 days, 3 tabs daily for 4 days, 2 tabs daily for 4 days, then 1 tab daily for 4 days. 03/06/14   Reuben Likes, MD  sertraline (ZOLOFT) 50 MG tablet Take 1 tablet (50 mg total) by mouth daily. 10/09/12   Dyann Kief, PA-C  tiotropium (SPIRIVA) 18 MCG inhalation capsule Place 18 mcg into inhaler and inhale daily.    Historical Provider, MD  traMADol (ULTRAM) 50 MG tablet  Take 1 tablet (50 mg total) by mouth every 6 (six) hours as needed. 12/19/13   Rodolph Bong, MD   BP 137/78  Pulse 76  Temp(Src) 97.8 F (36.6 C) (Oral)  SpO2 99%  Physical Exam  Nursing note and vitals reviewed. Constitutional: He appears well-developed and well-nourished. He is cooperative.  Non-toxic appearance. He does not have a sickly appearance. He does not appear ill. No distress.  HENT:  Head: Normocephalic and atraumatic.  Eyes: EOM are normal.  Neck: Normal range of motion. Neck supple.  Pulmonary/Chest: Effort normal. No respiratory distress.  Musculoskeletal: Normal range of motion. He exhibits no edema.  Neurological: He is alert.  Skin: He is not diaphoretic.  Psychiatric: He has a normal mood and affect. His behavior is normal.    ED Course  Procedures (including critical care time) Labs Review Labs Reviewed - No data to display  Imaging Review No results found.   EKG Interpretation None      MDM   Final diagnoses:  Chronic pain   Patient requesting tramadol refill. No new symptoms or injuries to knees or back. Patient can ambulate without assistance in the ED. Patient has previous Tramadol prescription bottle (empty) in the ED, filled 02/17/2014. Bottle has a large amount of powder to the outer rhythm. Due to patient's history of substance abuse and multiple visits for Ultram prescriptions oh not fill his prescription for tramadol in the ED and advised patient to followup with a pain management clinic or a back board the specialist for his chronic pain and medication therapy. Patient was seen 7/17 and was declined Tramadol by Hart Rochester, NP Family Provider.  He was seen on 7/18 and declined Tramadol perscription.  He was prescribed Robaxin, Mobic, and Norco on 7/3.    I personally performed the services described in this documentation, which was scribed in my presence. The recorded information has been reviewed and is accurate.    Clabe Seal,  PA-C 03/25/14 [phone]

## 2014-03-24 NOTE — ED Notes (Signed)
Called community liaison to come and speak with patient with his Medicaid issues.  Felicia at bedside to help patient with request.

## 2014-03-24 NOTE — Discharge Instructions (Signed)
Based on previous medical providers notes, I will not be prescribing you Tramadol. Call for a followup appointment with pain management, as previously advised by multiple providers. Call for a follow up appointment with a Family or Primary Care Provider.  Return if Symptoms worsen.   Take medication as prescribed.    Emergency Department Resource Guide 1) Find a Doctor and Pay Out of Pocket Although you won't have to find out who is covered by your insurance plan, it is a good idea to ask around and get recommendations. You will then need to call the office and see if the doctor you have chosen will accept you as a new patient and what types of options they offer for patients who are self-pay. Some doctors offer discounts or will set up payment plans for their patients who do not have insurance, but you will need to ask so you aren't surprised when you get to your appointment.  2) Contact Your Local Health Department Not all health departments have doctors that can see patients for sick visits, but many do, so it is worth a call to see if yours does. If you don't know where your local health department is, you can check in your phone book. The CDC also has a tool to help you locate your state's health department, and many state websites also have listings of all of their local health departments.  3) Find a Walk-in Clinic If your illness is not likely to be very severe or complicated, you may want to try a walk in clinic. These are popping up all over the country in pharmacies, drugstores, and shopping centers. They're usually staffed by nurse practitioners or physician assistants that have been trained to treat common illnesses and complaints. They're usually fairly quick and inexpensive. However, if you have serious medical issues or chronic medical problems, these are probably not your best option.  No Primary Care Doctor: - Call Health Connect at  [phone] - they can help you locate a primary  care doctor that  accepts your insurance, provides certain services, etc. - Physician Referral Service- [phone]  Chronic Pain Problems: Organization         Address  Phone   Notes  [facility]  [phone]) [phone] Patients need to be referred by their primary care doctor.   Medication Assistance: Organization         Address  Phone   Notes  [facility] Medication [facility] [address] [place], [place] [address] [phone]) [phone] --Must be a resident of [facility] [place] -- Must have NO insurance coverage whatsoever (no Medicaid/ Medicare, etc.) -- The pt. MUST have a primary care doctor that directs their care regularly and follows them in the community   MedAssist  [phone]   [employer]  [phone]    Agencies that provide inexpensive medical care: Organization         Address  Phone   Notes  [facility]  [phone]) [phone]   [facility]    [phone]   [facility] [address] [place], [place] [address] [phone]) [phone]   [facility] [address], [place] [phone]   Planned Parenthood    [phone]   [facility]    [phone]   [facility]  [address], [place] Phone:  [phone], Fax:  [phone]) [phone] Hours of Operation:  9 am - 6 pm, M-F.  Also accepts Medicaid/Medicare and self-pay.  [facility] [place], [address], [place] Phone: [phone], Fax: [phone]. Hours of Operation:  8:30 am - 5:30 pm, M-F.  Also accepts Medicaid and self-pay.  [facility] [place] [address] [place] Phone: [phone]   [place], [place], [place] [phone], Ext. [phone] Mondays & Thursdays: 7-9 AM.  First 15 patients are seen on a first come, first serve basis.    [place]  Providers:  Organization         Address  Phone   Notes  [facility] [address], [place] [phone] Also accepts self-pay patients.  [facility] [address], [place]  [phone]   [place], [address], [place] [phone]   [facility] [address] [place] [phone]   Lucianne Lei [address], [place]   [phone] Only accepts [place] Access [place] patients after they have their name applied to their card.   Self-Pay (no insurance) in [facility]:  Organization         Address  Phone   Notes  Sickle Cell Patients, [facility] [place] [phone]   [facility] [place] [phone]   [facility]  [place], [place],  [phone]   [facility]/Dr. Osei-Bonsu  [address] [place] or [address], [place] [phone] Phone number for both [place] and [place] locations is the same.  [facility] [address], [place] [phone]   [facility] [address] [place] or [address] [phone]   [facility] [address], [place] [phone], phone; [phone], fax Sees patients 1st and 3rd Saturday of every month.  Must not qualify for public or private insurance (i.e. Medicaid, Medicare, [place] Health Choice, Veterans' Benefits)  Household income should be no more than 200% of the poverty level The clinic cannot treat you if you are pregnant or think you are pregnant  Sexually transmitted diseases are not treated at the clinic.    Dental Care: Organization         Address  Phone  Notes  [facility] Department of [facility] [place] [phone] Accepts children up to age 12 who are enrolled in [place] or [place]; pregnant women with a Medicaid card; and children who have applied for Medicaid or [place] Health Choice, but were declined, whose parents can pay a reduced fee at time of service.  [facility] Department of [facility]  [address], [place] [phone] Accepts children up to age 24 who are enrolled in [place] or [place]; pregnant women with a Medicaid card; and children who have applied for Medicaid or [place] Health Choice, but were declined, whose parents can pay a reduced fee at time of service.  [facility] Adult [facility] Access PROGRAM  [place] [phone] Patients are seen by appointment only. Walk-ins are not accepted. [place] will see patients 18 years  of age and older. Monday - Tuesday (8am-5pm) Most Wednesdays (8:30-5pm) $30 per visit, cash only  [facility] Adult Dental Access PROGRAM  [address], [facility] [phone] Patients are seen by appointment only. Walk-ins are not accepted. [place] will see patients 43 years of age and older. One Wednesday Evening (Monthly: Volunteer Based).  $30 per visit, cash only  [place]  [phone] for adults; Children under age 81, call Graduate Pediatric Dentistry at [phone]. Children aged 70-14, please call [phone] to request a pediatric application.  Dental services are provided in all areas of dental care including fillings, crowns and bridges, complete and partial dentures, implants, gum treatment, root canals, and extractions. Preventive care is also provided. Treatment is provided to both adults and children. Patients are selected via a lottery and there is often a waiting list.   [facility] [address], [place]  [phone] [email or web address]   [facility]  [address] [place], [place] [phone], Ext. [phone] Second and Fourth Thursday of each month, opens at 6:30 AM; Clinic ends at 9 AM.  Patients are seen on a first-come first-served basis, and a limited number are seen during each clinic.   [facility]  [address] Hillard Danker [place], [place] [phone]   Eligibility Requirements You must have lived in [place], [place], or [place] counties for at least the last three months.   You cannot be eligible for state or federal sponsored [employer], including [employer], [place], or [employer].   You generally cannot be eligible for healthcare insurance through your employer.    How to apply: Eligibility screenings are held every Tuesday and Wednesday afternoon from 1:00 pm until 4:00 pm. You do not need an appointment for the interview!  [facility] [address], [place], [place]   [place]  [place] Department  [place]  [phone]    Behavioral Health Resources in the Community: Intensive Outpatient Programs Organization         Address  Phone  Notes  [place]. [address] [place], [place] [phone]   [facility] [address] [place], [place]   ADS: Alcohol & Drug Svcs [address] [place], [place]   [place] [address],  [place], [place] or [phone]   Substance Abuse Resources Organization         Address  Phone  Notes  Alcohol and Drug Services  [phone]   [place]  [phone]   The [place]   Chinita Pester  [phone]   Residential & Outpatient Substance Abuse Program  [phone]   Psychological Services Organization         Address  Phone  Notes  [facility] [place]  [place]  [phone]   [place] [address] [place] or [phone]    Mobile Crisis Teams Organization         Address  Phone  Notes  [facility], Mobile Crisis Care Unit  [phone]   [facility]  [address] [place], [place]   [facility] [address] [place] [phone]    Self-Help/Support Groups Organization  Address  Phone             Notes  [facility] of [place] - variety of support groups  336- [phone] Call for more information  Narcotics Anonymous (NA), [facility]aring [facility] [address], [place]  2 meetings at this location   Statisticianesidential Treatment Programs Organization         Address  Phone  Notes  [facility] [address],    [place]  [phone]   [facility]  [address], [place] [phone], [place], [place] [phone]   [facility] [address], [place] [phone] Admissions: 8am-3pm M-F  [facility] [address]    [place], [place] [phone]   [facility] [address], [place], [place] [phone]   [facility] [address]  [place], [place] [phone]   [facility] - Intensive Outpatient [address], [place], [place] [phone]   [facility] ([facility] Recovery [facility] [address]  [place], [place] [phone] or [phone]   [facility] (RTS) [address] [place], [place] [phone] Accepts Medicaid  [facility] [address]  [place] [phone] Substance Abuse/Addiction Treatment   [facility] Behavioral Health Resources Organization         Address  Phone  Notes  [facility]  [phone]) [phone]   Angie FavaJulie Brannon, PhD [address] [place], [place]   [phone]) [phone] or [phone]) [phone]    [facility]   [address] [place], [place] [phone](336) [phone]   [facility] [address], [place], [place] [phone](336) [phone] Insurance/Medicaid/sponsorship through [facility]  [facility] [address]                                    [place], [place] [phone](336) [phone] Therapy/tele-psych/case  [facility] [address].   [place], [place] [phone]) [phone]    Dr. Lolly MustacheArfeen  [phone]) [phone]   [facility] [place]  [employer] [place] [facility] 1) [address], [place] 2) [address], [place] 3)  [address], [place] [phone]) [phone]) [phone]  [phone](336) [phone]   [facility] Child Abuse Hotline [phone](336) [phone] or [phone](336) [phone] (After Hours)

## 2014-03-25 NOTE — ED Provider Notes (Signed)
Medical screening examination/treatment/procedure(s) were performed by non-physician practitioner and as supervising physician I was immediately available for consultation/collaboration.   EKG Interpretation None      Devoria AlbeIva Wilba Mutz, MD, Armando GangFACEP   Ward GivensIva L Khayree Delellis, MD 03/25/14 [phone]

## 2014-03-27 ENCOUNTER — Telehealth: Payer: Self-pay

## 2014-03-27 NOTE — Telephone Encounter (Signed)
Called and scheduled patient with smoking Cessation Class [phone]. Coordinator to call and make patient aware. Thanks!

## 2014-06-25 ENCOUNTER — Encounter: Payer: Medicaid Other | Admitting: Internal Medicine

## 2014-08-01 ENCOUNTER — Emergency Department (HOSPITAL_COMMUNITY)
Admission: EM | Admit: 2014-08-01 | Discharge: 2014-08-01 | Disposition: A | Discharge status: Home / Self Care | Payer: Medicaid Other | Attending: Emergency Medicine | Admitting: Emergency Medicine

## 2014-08-01 ENCOUNTER — Encounter (HOSPITAL_COMMUNITY): Payer: Self-pay | Admitting: *Deleted

## 2014-08-01 DIAGNOSIS — Z79899 Other long term (current) drug therapy: Secondary | ICD-10-CM | POA: Diagnosis not present

## 2014-08-01 DIAGNOSIS — Z8709 Personal history of other diseases of the respiratory system: Secondary | ICD-10-CM | POA: Insufficient documentation

## 2014-08-01 DIAGNOSIS — L02411 Cutaneous abscess of right axilla: Secondary | ICD-10-CM | POA: Insufficient documentation

## 2014-08-01 DIAGNOSIS — Z720 Tobacco use: Secondary | ICD-10-CM | POA: Insufficient documentation

## 2014-08-01 DIAGNOSIS — Z8679 Personal history of other diseases of the circulatory system: Secondary | ICD-10-CM | POA: Insufficient documentation

## 2014-08-01 DIAGNOSIS — Z7952 Long term (current) use of systemic steroids: Secondary | ICD-10-CM | POA: Diagnosis not present

## 2014-08-01 DIAGNOSIS — Z8701 Personal history of pneumonia (recurrent): Secondary | ICD-10-CM | POA: Insufficient documentation

## 2014-08-01 DIAGNOSIS — Z9889 Other specified postprocedural states: Secondary | ICD-10-CM | POA: Diagnosis not present

## 2014-08-01 DIAGNOSIS — Z8619 Personal history of other infectious and parasitic diseases: Secondary | ICD-10-CM | POA: Diagnosis not present

## 2014-08-01 DIAGNOSIS — Z8669 Personal history of other diseases of the nervous system and sense organs: Secondary | ICD-10-CM | POA: Diagnosis not present

## 2014-08-01 DIAGNOSIS — L0291 Cutaneous abscess, unspecified: Secondary | ICD-10-CM

## 2014-08-01 DIAGNOSIS — Z8674 Personal history of sudden cardiac arrest: Secondary | ICD-10-CM | POA: Insufficient documentation

## 2014-08-01 MED ORDER — BACITRACIN 500 UNIT/GM EX OINT
1.0000 "application " | TOPICAL_OINTMENT | Freq: Two times a day (BID) | CUTANEOUS | Status: DC
  Administered 2014-08-01: 1 via TOPICAL
  Filled 2014-08-01 (×4): qty 28.4

## 2014-08-01 MED ORDER — NAPROXEN 500 MG PO TABS: 500.0000 mg | ORAL_TABLET | Freq: Two times a day (BID) | ORAL | Status: DC

## 2014-08-01 MED ORDER — HYDROCODONE-ACETAMINOPHEN 5-325 MG PO TABS: 2.0000 | ORAL_TABLET | ORAL | Status: DC | PRN

## 2014-08-01 MED ORDER — LIDOCAINE HCL (PF) 1 % IJ SOLN
5.0000 mL | Freq: Once | INTRAMUSCULAR | Status: AC
  Administered 2014-08-01: 5 mL via INTRADERMAL
  Filled 2014-08-01: qty 5

## 2014-08-01 MED ORDER — CEPHALEXIN 500 MG PO CAPS: 500.0000 mg | ORAL_CAPSULE | Freq: Four times a day (QID) | ORAL | Status: DC

## 2014-08-01 NOTE — ED Notes (Signed)
Declined W/C at D/C and was escorted to lobby by RN. 

## 2014-08-01 NOTE — Discharge Instructions (Signed)

## 2014-08-01 NOTE — ED Notes (Signed)
Pt in c/o abscess to right axillary area, states he has recently been having them pop up but they normally resolve on their own, no distress noted

## 2014-08-01 NOTE — ED Provider Notes (Addendum)
CSN: [id number]     Arrival date & time 08/01/14  1204 History  This chart was scribed for Joycie PeekBenjamin Tejas [place], PA-C, working with Arby BarretteMarcy Pfeiffer, MD by Chestine SporeSoijett Blue, ED Scribe. The patient was seen in room TR07C/TR07C at 2:05 PM.    Chief Complaint  Patient presents with  . Abscess     The history is provided by the patient. No language interpreter was used.   HPI Comments: Angel [name] is a 48 y.o. male who presents to the Emergency Department complaining of abscess onset 4-5 days. He has had this happen before and they normally resolve on their own. He states that he is having associated symptoms of intermittent fever. He states that he has not tried any medications for the relief for his symptoms. He denies abdominal pain and any other symptoms. No other modifying factors.  Past Medical History  Diagnosis Date  . History of cardiac arrest     a. 12/2011 PEA arrest in setting of drug/etoh use  . Nonischemic cardiomyopathy     a. 12/2011 Echo: EF 20-25% (in setting of PEA arrest);  b. 01/2012 f/u Echo: EF 60-65%, 01/2012 Myoview: EF 58%, inf/infsept scar w/ anteroseptal/inflat ischemia;  d. 01/2012 Cath: NL Cors, EF 60-65%  . Aspiration pneumonia     a. 12/2011 in setting of PEA arrest  . Polysubstance abuse     a. 12/2011 UDS + for cocaine, THC  . History of respiratory failure     a. 12/2011 in setting of PEA  . History of bacteremia     a. G. coccobacilli 12/2011  . Anoxic encephalopathy     a. 12/2011  . Pulmonary infiltrate in right lung on chest x-ray     a. 01/2012  w/ rec for outpt f/u cxr   Past Surgical History  Procedure Laterality Date  . Cardiac catheterization  5/13    left heart with angiogram   Family History  Problem Relation Age of Onset  . Heart disease Father    History  Substance Use Topics  . Smoking status: Current Every Day Smoker -- 1.00 packs/day for 35 years    Types: Cigarettes  . Smokeless tobacco: Never Used  . Alcohol Use: No    Review of Systems   Constitutional: Positive for fever.  Gastrointestinal: Negative for abdominal pain.  Skin:       Abscess in the right armpit  All other systems reviewed and are negative.     Allergies  Review of patient's allergies indicates no known allergies.  Home Medications   Prior to Admission medications   Medication Sig Start Date End Date Taking? Authorizing Provider  calcium carbonate (OS-CAL) 600 MG TABS Take 600 mg by mouth 2 (two) times daily with a meal.    Historical Provider, MD  cyclobenzaprine (FLEXERIL) 10 MG tablet Take 1 tablet (10 mg total) by mouth at bedtime as needed for muscle spasms. 12/19/13   Rodolph BongEvan S Corey, MD  diclofenac (VOLTAREN) 75 MG EC tablet Take 1 tablet (75 mg total) by mouth 2 (two) times daily as needed. 12/19/13   Rodolph BongEvan S Corey, MD  meloxicam (MOBIC) 15 MG tablet Take 1 tablet (15 mg total) by mouth daily. 03/06/14   Reuben Likesavid C Keller, MD  meloxicam (MOBIC) 7.5 MG tablet Take 1 tablet (7.5 mg total) by mouth daily. 03/21/14   Mathis FareJennifer Lee H Presson, PA  methocarbamol (ROBAXIN) 500 MG tablet Take 1 tablet (500 mg total) by mouth 3 (three) times daily. 03/20/14  Massie MaroonLachina M Hollis, FNP  Multiple Vitamin (MULITIVITAMIN WITH MINERALS) TABS Take 1 tablet by mouth daily.    Historical Provider, MD  predniSONE (DELTASONE) 10 MG tablet Take 4 tabs daily for 4 days, 3 tabs daily for 4 days, 2 tabs daily for 4 days, then 1 tab daily for 4 days. 03/06/14   Reuben Likesavid C Keller, MD  sertraline (ZOLOFT) 50 MG tablet Take 1 tablet (50 mg total) by mouth daily. 10/09/12   Dyann KiefMichele M Lenze, PA-C  tiotropium (SPIRIVA) 18 MCG inhalation capsule Place 18 mcg into inhaler and inhale daily.    Historical Provider, MD  traMADol (ULTRAM) 50 MG tablet Take 1 tablet (50 mg total) by mouth every 6 (six) hours as needed. 12/19/13   Rodolph BongEvan S Corey, MD   BP 132/82 mmHg  Pulse 70  Temp(Src) 97.9 F (36.6 C) (Oral)  Resp 20  Ht 6\' 2"  (1.88 m)  Wt 180 lb (81.647 kg)  BMI 23.10 kg/m2  SpO2 100%  Physical Exam   Constitutional: He is oriented to person, place, and time. He appears well-developed and well-nourished. No distress.  HENT:  Head: Normocephalic and atraumatic.  Mouth/Throat: Oropharynx is clear and moist.  Eyes: EOM are normal.  Neck: Neck supple. No tracheal deviation present.  Cardiovascular: Normal rate, regular rhythm and normal heart sounds.  Exam reveals no gallop and no friction rub.   No murmur heard. Pulmonary/Chest: Effort normal and breath sounds normal. No respiratory distress. He has no wheezes. He has no rales.  Abdominal: Soft. There is no tenderness.  Musculoskeletal: Normal range of motion.  Neurological: He is alert and oriented to person, place, and time.  Skin: Skin is warm and dry. There is erythema.  Right side anterior axilla in the armpit 3 cm x 1.5 cm focal abscess with surrounding erythema.   Psychiatric: He has a normal mood and affect. His behavior is normal.  Nursing note and vitals reviewed.   ED Course  INCISION AND DRAINAGE Date/Time: 08/01/2014 8:55 PM Performed by: Sharlene MottsARTNER, Autry Prust W Authorized by: Sharlene MottsARTNER, Chanin Frumkin W Consent: Verbal consent obtained. Risks and benefits: risks, benefits and alternatives were discussed Consent given by: patient Patient understanding: patient states understanding of the procedure being performed Site marked: the operative site was marked Patient identity confirmed: verbally with patient Time out: Immediately prior to procedure a "time out" was called to verify the correct patient, procedure, equipment, support staff and site/side marked as required. Type: abscess Body area: trunk Location details: right breast Anesthesia: local infiltration Local anesthetic: lidocaine 1% without epinephrine Anesthetic total: 3 ml Patient sedated: no Scalpel size: 11 Complexity: simple Drainage: purulent and  bloody Drainage amount: moderate Wound treatment: wound left open Packing material: 1/4 in iodoform gauze Patient  tolerance: Patient tolerated the procedure well with no immediate complications   (including critical care time) DIAGNOSTIC STUDIES: Oxygen Saturation is 100% on room air, normal by my interpretation.    COORDINATION OF CARE: 2:09 PM-Discussed treatment plan which includes I&D with pt at bedside and pt agreed to plan.    Labs Review Labs Reviewed - No data to display  Imaging Review No results found.   EKG Interpretation None      MDM  Vitals stable - WNL -afebrile Pt resting comfortably in ED. PE- Not concerning for other acute or emergent pathology. Abscess drained at bedside with moderate purulent discharge. Wound packed with 1/4 iodoform gauze. Pt tolerated well.  Will DC with Antibiotic, pain medicine. Discussed f/u with PCP and return  precautions, pt very amenable to plan. Pt stable, in good condition and is appropriate for discharge   Final diagnoses:  None      I personally performed the services described in this documentation, which was scribed in my presence. The recorded information has been reviewed and is accurate.    Earle Gell [place], PA-C 08/01/14 2101  Arby Barrette, MD 08/02/14 1649  Earle Gell [place], PA-C 08/06/14 1205  Arby Barrette, MD 08/06/14 [phone]

## 2014-08-13 ENCOUNTER — Encounter (HOSPITAL_COMMUNITY): Payer: Self-pay | Admitting: Cardiology

## 2014-11-29 ENCOUNTER — Encounter (HOSPITAL_COMMUNITY): Payer: Self-pay

## 2014-11-29 ENCOUNTER — Emergency Department (INDEPENDENT_AMBULATORY_CARE_PROVIDER_SITE_OTHER): Payer: Medicaid Other

## 2014-11-29 ENCOUNTER — Emergency Department (INDEPENDENT_AMBULATORY_CARE_PROVIDER_SITE_OTHER)
Admission: EM | Admit: 2014-11-29 | Discharge: 2014-11-29 | Disposition: A | Discharge status: Home / Self Care | Payer: Medicaid Other | Source: Home / Self Care | Attending: Family Medicine | Admitting: Family Medicine

## 2014-11-29 DIAGNOSIS — T148 Other injury of unspecified body region: Secondary | ICD-10-CM

## 2014-11-29 DIAGNOSIS — T07XXXA Unspecified multiple injuries, initial encounter: Secondary | ICD-10-CM

## 2014-11-29 MED ORDER — MELOXICAM 7.5 MG PO TABS: 7.5000 mg | ORAL_TABLET | Freq: Two times a day (BID) | ORAL | Status: DC

## 2014-11-29 NOTE — Discharge Instructions (Signed)
Ice, sling and medicine as prescribed, see orthopedsit if further problems.

## 2014-11-29 NOTE — ED Provider Notes (Signed)
CSN: [id number]     Arrival date & time 11/29/14  1456 History   First MD Initiated Contact with Patient 11/29/14 1514     Chief Complaint  Patient presents with  . Fall   (Consider location/radiation/quality/duration/timing/severity/associated sxs/prior Treatment) Patient is a 49 y.o. male presenting with fall. The history is provided by the patient.  Fall This is a new problem. The current episode started more than 2 days ago (fell 10 ft off of roof to ground with injury to right elow, left shoulder, low back ( which has resolved). no other injury or complaints., no head or neck injury.). The problem has not changed since onset.   Past Medical History  Diagnosis Date  . History of cardiac arrest     a. 12/2011 PEA arrest in setting of drug/etoh use  . Nonischemic cardiomyopathy     a. 12/2011 Echo: EF 20-25% (in setting of PEA arrest);  b. 01/2012 f/u Echo: EF 60-65%, 01/2012 Myoview: EF 58%, inf/infsept scar w/ anteroseptal/inflat ischemia;  d. 01/2012 Cath: NL Cors, EF 60-65%  . Aspiration pneumonia     a. 12/2011 in setting of PEA arrest  . Polysubstance abuse     a. 12/2011 UDS + for cocaine, THC  . History of respiratory failure     a. 12/2011 in setting of PEA  . History of bacteremia     a. G. coccobacilli 12/2011  . Anoxic encephalopathy     a. 12/2011  . Pulmonary infiltrate in right lung on chest x-ray     a. 01/2012  w/ rec for outpt f/u cxr   Past Surgical History  Procedure Laterality Date  . Cardiac catheterization  5/13    left heart with angiogram  . Left heart catheterization with coronary angiogram N/A 01/19/2012    Procedure: LEFT HEART CATHETERIZATION WITH CORONARY ANGIOGRAM;  Surgeon: Laurey Moralealton S McLean, MD;  [facility];  Service: Cardiovascular;  Laterality: N/A;   Family History  Problem Relation Age of Onset  . Heart disease Father    History  Substance Use Topics  . Smoking status: Current Every Day Smoker -- 1.00 packs/day for 35 years    Types:  Cigarettes  . Smokeless tobacco: Never Used  . Alcohol Use: No    Review of Systems  Constitutional: Negative.   Musculoskeletal: Negative for myalgias, joint swelling and gait problem.  Skin: Negative.     Allergies  Review of patient's allergies indicates no known allergies.  Home Medications   Prior to Admission medications   Medication Sig Start Date End Date Taking? Authorizing Provider  calcium carbonate (OS-CAL) 600 MG TABS Take 600 mg by mouth 2 (two) times daily with a meal.    Historical Provider, MD  cephALEXin (KEFLEX) 500 MG capsule Take 1 capsule (500 mg total) by mouth 4 (four) times daily. 08/01/14   Joycie PeekBenjamin Cartner, PA-C  cyclobenzaprine (FLEXERIL) 10 MG tablet Take 1 tablet (10 mg total) by mouth at bedtime as needed for muscle spasms. Patient not taking: Reported on 08/01/2014 12/19/13   Rodolph BongEvan S Corey, MD  diclofenac (VOLTAREN) 75 MG EC tablet Take 1 tablet (75 mg total) by mouth 2 (two) times daily as needed. Patient not taking: Reported on 08/01/2014 12/19/13   Rodolph BongEvan S Corey, MD  HYDROcodone-acetaminophen (NORCO/VICODIN) 5-325 MG per tablet Take 2 tablets by mouth every 4 (four) hours as needed for moderate pain or severe pain. 08/01/14   Joycie PeekBenjamin Cartner, PA-C  meloxicam (MOBIC) 7.5 MG tablet Take 1 tablet (  7.5 mg total) by mouth 2 (two) times daily after a meal. 11/29/14   Linna Hoff, MD  methocarbamol (ROBAXIN) 500 MG tablet Take 1 tablet (500 mg total) by mouth 3 (three) times daily. Patient not taking: Reported on 08/01/2014 03/20/14   Massie Maroon, FNP  Multiple Vitamin (MULITIVITAMIN WITH MINERALS) TABS Take 1 tablet by mouth daily.    Historical Provider, MD  naproxen (NAPROSYN) 500 MG tablet Take 1 tablet (500 mg total) by mouth 2 (two) times daily. 08/01/14   Joycie Peek, PA-C  predniSONE (DELTASONE) 10 MG tablet Take 4 tabs daily for 4 days, 3 tabs daily for 4 days, 2 tabs daily for 4 days, then 1 tab daily for 4 days. Patient not taking: Reported  on 08/01/2014 03/06/14   Reuben Likes, MD  sertraline (ZOLOFT) 50 MG tablet Take 1 tablet (50 mg total) by mouth daily. 10/09/12   Dyann Kief, PA-C  tiotropium (SPIRIVA) 18 MCG inhalation capsule Place 18 mcg into inhaler and inhale daily.    Historical Provider, MD  traMADol (ULTRAM) 50 MG tablet Take 1 tablet (50 mg total) by mouth every 6 (six) hours as needed. Patient not taking: Reported on 08/01/2014 12/19/13   Rodolph Bong, MD   BP 137/86 mmHg  Pulse 76  Temp(Src) 99 F (37.2 C) (Oral)  Resp 16  SpO2 98% Physical Exam  Constitutional: He is oriented to person, place, and time. He appears well-developed and well-nourished.  Musculoskeletal: He exhibits tenderness.  Soreness over right olecranon with rom, no visible trauma, full rom, distal nvt imtact.  Sore ant left shoulder, nl abduction, rotation, distal nvt intact, no visible trauma.  Neurological: He is alert and oriented to person, place, and time.  Skin: Skin is warm and dry.  Nursing note and vitals reviewed.   ED Course  Procedures (including critical care time) Labs Review Labs Reviewed - No data to display  Imaging Review Dg Elbow Complete Right  11/29/2014   CLINICAL DATA:  Pain following fall from ladder  EXAM: RIGHT ELBOW - COMPLETE 3+ VIEW  COMPARISON:  None.  FINDINGS: Frontal, lateral, and bilateral oblique views were obtained. There is no demonstrable fracture or dislocation. No appreciable joint effusion. There is a spur arising from the olecranon process of the proximal ulna. There is no appreciable joint space narrowing.  IMPRESSION: Small olecranon spur. No fracture or dislocation. No joint effusion.   Electronically Signed   By: Bretta Bang III M.D.   On: 11/29/2014 15:45   Dg Shoulder Left  11/29/2014   CLINICAL DATA:  Patient status post fall from ladder. Anterior left shoulder pain.  EXAM: LEFT SHOULDER - 2+ VIEW  COMPARISON:  None.  FINDINGS: Small corticated ossific density along the inferior  margin of the glenoid. Humeral head appears intact. Mild AC joint degenerative change. No evidence for dislocation. Visualized left hemi thorax is unremarkable.  IMPRESSION: Small ossific density along the inferior aspect of the glenoid may be secondary to degenerative changes. Small avulsion injury not excluded.   Electronically Signed   By: Annia Belt M.D.   On: 11/29/2014 15:51     MDM   1. Multiple contusions        Linna Hoff, MD 11/29/14 1606

## 2014-11-29 NOTE — ED Notes (Signed)
Reportedly fell 10 from ladder onto ground 3 days ago. C/o pain right elbow and left shoulder, worse w ROM . Denies LOC or other injury

## 2015-11-24 ENCOUNTER — Ambulatory Visit (INDEPENDENT_AMBULATORY_CARE_PROVIDER_SITE_OTHER)
Admission: RE | Admit: 2015-11-24 | Discharge: 2015-11-24 | Disposition: A | Discharge status: Home / Self Care | Payer: Medicaid Other | Source: Ambulatory Visit | Attending: Adult Health | Admitting: Adult Health

## 2015-11-24 ENCOUNTER — Ambulatory Visit (INDEPENDENT_AMBULATORY_CARE_PROVIDER_SITE_OTHER): Payer: Medicaid Other | Admitting: Adult Health

## 2015-11-24 ENCOUNTER — Encounter: Payer: Self-pay | Admitting: Adult Health

## 2015-11-24 VITALS — BP 116/78 | HR 58 | Temp 97.7°F | Ht 75.0 in | Wt 171.0 lb

## 2015-11-24 DIAGNOSIS — J40 Bronchitis, not specified as acute or chronic: Secondary | ICD-10-CM | POA: Diagnosis not present

## 2015-11-24 DIAGNOSIS — J209 Acute bronchitis, unspecified: Secondary | ICD-10-CM | POA: Diagnosis not present

## 2015-11-24 MED ORDER — PREDNISONE 10 MG PO TABS: ORAL_TABLET | ORAL | Status: DC

## 2015-11-24 MED ORDER — DOXYCYCLINE HYCLATE 100 MG PO TABS: 100.0000 mg | ORAL_TABLET | Freq: Two times a day (BID) | ORAL | Status: DC

## 2015-11-24 NOTE — Patient Instructions (Signed)
Doxycycline 100mg  Twice daily  For 7 days .  Prednisone taper over next week.  Mucinex DM Twice daily  As needed  Cough/congestion   Work on not smoking .  Chest xray today .  Follow up Dr. Marchelle Gearingamaswamy in 3 months and As needed   Please contact office for sooner follow up if symptoms do not improve or worsen or seek emergency care

## 2015-11-24 NOTE — Progress Notes (Signed)
Subjective:    Patient ID: Angel [name], male    DOB: 1966/05/08, 50 y.o.   MRN: [id number]  HPI 50  yo male, history of smoking and polysubstance  abuse admitted by pulmonary and critical care medicine on December 24, 2011 for shock, acute respiratory failure, pneumonia, status post cardiac arrest . He had a prolonged hospitalization complicated by tracheostomy, post anoxic encephalopathy and dysphagia  11/24/2015 Acute OV  Pt complains of  prod cough with occasional mucus, sinus pressure/drainage, chest congestion/tightness, SOB and wheezing. Cough has been going on for about 3 months. Taking otc cold meds.  Denies fever, nausea or vomiting., no weight loss.  Previously On Spiriva.samples but never continued this.  Last seen in office 2015. Marland Kitchen  PFT in 2015 showed preserved lung function.  No etoh or drug use  No cocaine since 2013 per pt . Says takes tramadol for knee pain. Has to do urine test to get rx.   Past Medical History  Diagnosis Date  . History of cardiac arrest     a. 12/2011 PEA arrest in setting of drug/etoh use  . Nonischemic cardiomyopathy (HCC)     a. 12/2011 Echo: EF 20-25% (in setting of PEA arrest);  b. 01/2012 f/u Echo: EF 60-65%, 01/2012 Myoview: EF 58%, inf/infsept scar w/ anteroseptal/inflat ischemia;  d. 01/2012 Cath: NL Cors, EF 60-65%  . Aspiration pneumonia (HCC)     a. 12/2011 in setting of PEA arrest  . Polysubstance abuse     a. 12/2011 UDS + for cocaine, THC  . History of respiratory failure     a. 12/2011 in setting of PEA  . History of bacteremia     a. G. coccobacilli 12/2011  . Anoxic encephalopathy (HCC)     a. 12/2011  . Pulmonary infiltrate in right lung on chest x-ray     a. 01/2012  w/ rec for outpt f/u cxr   Current Outpatient Prescriptions on File Prior to Visit  Medication Sig Dispense Refill  . sertraline (ZOLOFT) 50 MG tablet Take 1 tablet (50 mg total) by mouth daily. 30 tablet 0  . tiotropium (SPIRIVA) 18 MCG inhalation capsule Place 18 mcg  into inhaler and inhale daily.    . traMADol (ULTRAM) 50 MG tablet Take 1 tablet (50 mg total) by mouth every 6 (six) hours as needed. 15 tablet 0  . calcium carbonate (OS-CAL) 600 MG TABS Take 600 mg by mouth 2 (two) times daily with a meal. Reported on 11/24/2015    . cephALEXin (KEFLEX) 500 MG capsule Take 1 capsule (500 mg total) by mouth 4 (four) times daily. (Patient not taking: Reported on 11/24/2015) 40 capsule 0  . cyclobenzaprine (FLEXERIL) 10 MG tablet Take 1 tablet (10 mg total) by mouth at bedtime as needed for muscle spasms. (Patient not taking: Reported on 08/01/2014) 20 tablet 0  . diclofenac (VOLTAREN) 75 MG EC tablet Take 1 tablet (75 mg total) by mouth 2 (two) times daily as needed. (Patient not taking: Reported on 08/01/2014) 60 tablet 0  . HYDROcodone-acetaminophen (NORCO/VICODIN) 5-325 MG per tablet Take 2 tablets by mouth every 4 (four) hours as needed for moderate pain or severe pain. (Patient not taking: Reported on 11/24/2015) 6 tablet 0  . meloxicam (MOBIC) 7.5 MG tablet Take 1 tablet (7.5 mg total) by mouth 2 (two) times daily after a meal. (Patient not taking: Reported on 11/24/2015) 30 tablet 1  . methocarbamol (ROBAXIN) 500 MG tablet Take 1 tablet (500 mg total) by mouth  3 (three) times daily. (Patient not taking: Reported on 08/01/2014) 30 tablet 0  . Multiple Vitamin (MULITIVITAMIN WITH MINERALS) TABS Take 1 tablet by mouth daily. Reported on 11/24/2015    . naproxen (NAPROSYN) 500 MG tablet Take 1 tablet (500 mg total) by mouth 2 (two) times daily. (Patient not taking: Reported on 11/24/2015) 30 tablet 0   No current facility-administered medications on file prior to visit.      Review of Systems Constitutional:   No  weight loss, night sweats,  Fevers, chills, fatigue, or  lassitude.  HEENT:   No headaches,  Difficulty swallowing,  Tooth/dental problems, or  Sore throat,                No sneezing, itching, ear ache, nasal congestion, post nasal drip,   CV:  No  chest pain,  Orthopnea, PND, swelling in lower extremities, anasarca, dizziness, palpitations, syncope.   GI  No heartburn, indigestion, abdominal pain, nausea, vomiting, diarrhea, change in bowel habits, loss of appetite, bloody stools.   Resp:  no chest wall deformity  Skin: no rash or lesions.  GU: no dysuria, change in color of urine, no urgency or frequency.  No flank pain, no hematuria   MS:  No joint pain or swelling.  No decreased range of motion.  No back pain.  Psych:  No change in mood or affect. No depression or anxiety.  No memory loss.         Objective:   Physical Exam Filed Vitals:   11/24/15 1043  BP: 116/78  Pulse: 58  Temp: 97.7 F (36.5 C)  TempSrc: Oral  Height: 6\' 3"  (1.905 m)  Weight: 171 lb (77.565 kg)  SpO2: 98%    GEN: A/Ox3; pleasant , NAD, well nourished   HEENT:  [place]/AT,  EACs-clear, TMs-wnl, NOSE-clear, THROAT-clear, no lesions, no postnasal drip or exudate noted. Poor dentiton  NECK:  Supple w/ fair ROM; no JVD; normal carotid impulses w/o bruits; no thyromegaly or nodules palpated; no lymphadenopathy.  RESP  Few trace wheezing , no accessory muscle use, no dullness to percussion  CARD:  RRR, no m/r/g  , no peripheral edema, pulses intact, no cyanosis or clubbing.  GI:   Soft & nt; nml bowel sounds; no organomegaly or masses detected.  Musco: Warm bil, no deformities or joint swelling noted.   Neuro: alert, no focal deficits noted.    Skin: Warm, no lesions or rashes    Jenny Lai NP-C  [facility]  11/24/15      Assessment & Plan:

## 2015-11-24 NOTE — Assessment & Plan Note (Signed)
Flare in smoker  Check cxr  Smoking cessation   Plan  Doxycycline 100mg  Twice daily  For 7 days .  Prednisone taper over next week.  Mucinex DM Twice daily  As needed  Cough/congestion   Work on not smoking .  Chest xray today .  Follow up Dr. Marchelle Gearingamaswamy in 3 months and As needed   Please contact office for sooner follow up if symptoms do not improve or worsen or seek emergency care

## 2015-11-29 NOTE — Progress Notes (Signed)
Quick Note:  Called and spoke with pt. Reviewed results and recs. Pt voiced understanding and had no further questions. ______ 

## 2016-02-08 ENCOUNTER — Ambulatory Visit: Payer: Medicaid Other | Admitting: Internal Medicine

## 2016-02-10 ENCOUNTER — Emergency Department (HOSPITAL_COMMUNITY): Payer: Medicaid Other

## 2016-02-10 ENCOUNTER — Encounter (HOSPITAL_COMMUNITY): Payer: Self-pay | Admitting: Emergency Medicine

## 2016-02-10 ENCOUNTER — Emergency Department (HOSPITAL_COMMUNITY)
Admission: EM | Admit: 2016-02-10 | Discharge: 2016-02-10 | Disposition: A | Discharge status: Home / Self Care | Payer: Medicaid Other | Attending: Emergency Medicine | Admitting: Emergency Medicine

## 2016-02-10 DIAGNOSIS — M545 Low back pain, unspecified: Secondary | ICD-10-CM

## 2016-02-10 DIAGNOSIS — Z791 Long term (current) use of non-steroidal anti-inflammatories (NSAID): Secondary | ICD-10-CM | POA: Insufficient documentation

## 2016-02-10 DIAGNOSIS — Z7982 Long term (current) use of aspirin: Secondary | ICD-10-CM | POA: Insufficient documentation

## 2016-02-10 DIAGNOSIS — Z79899 Other long term (current) drug therapy: Secondary | ICD-10-CM | POA: Diagnosis not present

## 2016-02-10 DIAGNOSIS — F17210 Nicotine dependence, cigarettes, uncomplicated: Secondary | ICD-10-CM | POA: Diagnosis not present

## 2016-02-10 LAB — BASIC METABOLIC PANEL
Anion gap: 7 (ref 5–15)
BUN: 24 mg/dL — AB (ref 6–20)
CHLORIDE: 109 mmol/L (ref 101–111)
CO2: 22 mmol/L (ref 22–32)
CREATININE: 1.14 mg/dL (ref 0.61–1.24)
Calcium: 9.1 mg/dL (ref 8.9–10.3)
GFR calc Af Amer: >60 mL/min (ref >60)
GFR calc non Af Amer: >60 mL/min (ref >60)
Glucose, Bld: 107 mg/dL — ABNORMAL HIGH (ref 65–99)
Potassium: 4 mmol/L (ref 3.5–5.1)
Sodium: 138 mmol/L (ref 135–145)

## 2016-02-10 LAB — URINALYSIS, ROUTINE W REFLEX MICROSCOPIC
Bilirubin Urine: NEGATIVE
Glucose, UA: NEGATIVE mg/dL
HGB URINE DIPSTICK: NEGATIVE
Ketones, ur: NEGATIVE mg/dL
Leukocytes, UA: NEGATIVE
Nitrite: NEGATIVE
PH: 5.5 (ref 5.0–8.0)
Protein, ur: NEGATIVE mg/dL
SPECIFIC GRAVITY, URINE: 1.029 (ref 1.005–1.030)

## 2016-02-10 LAB — CBC
HEMATOCRIT: 48.4 % (ref 39.0–52.0)
Hemoglobin: 16.4 g/dL (ref 13.0–17.0)
MCH: 31.8 pg (ref 26.0–34.0)
MCHC: 33.9 g/dL (ref 30.0–36.0)
MCV: 94 fL (ref 78.0–100.0)
PLATELETS: 254 10*3/uL (ref 150–400)
RBC: 5.15 MIL/uL (ref 4.22–5.81)
RDW: 14 % (ref 11.5–15.5)
WBC: 8.5 10*3/uL (ref 4.0–10.5)

## 2016-02-10 LAB — LIPASE, BLOOD: Lipase: 25 U/L (ref 11–51)

## 2016-02-10 MED ORDER — METHOCARBAMOL 500 MG PO TABS: 500.0000 mg | ORAL_TABLET | Freq: Two times a day (BID) | ORAL | Status: DC

## 2016-02-10 MED ORDER — HYDROCODONE-ACETAMINOPHEN 5-325 MG PO TABS
2.0000 | ORAL_TABLET | Freq: Once | ORAL | Status: AC
  Administered 2016-02-10: 2 via ORAL
  Filled 2016-02-10: qty 2

## 2016-02-10 MED ORDER — IOPAMIDOL (ISOVUE-300) INJECTION 61%
INTRAVENOUS | Status: AC
  Administered 2016-02-10: 100 mL
  Filled 2016-02-10: qty 100

## 2016-02-10 MED ORDER — SODIUM CHLORIDE 0.9 % IV BOLUS (SEPSIS)
1000.0000 mL | Freq: Once | INTRAVENOUS | Status: AC
  Administered 2016-02-10: 1000 mL via INTRAVENOUS

## 2016-02-10 MED ORDER — NAPROXEN 500 MG PO TABS: 500.0000 mg | ORAL_TABLET | Freq: Two times a day (BID) | ORAL | Status: DC

## 2016-02-10 NOTE — ED Notes (Signed)
Pt c/o "something in back is swollen-- pushing on my spine. There is something wrong with my urination too-- " denies dysuria, frequency. States does not feel like a pulled muscle.

## 2016-02-10 NOTE — ED Notes (Signed)
Patient transported to CT 

## 2016-02-10 NOTE — Discharge Instructions (Signed)
Angel [name],  Angel [name] meeting you! Please follow-up with your primary care provider. Return to the emergency department if you develop fevers, chills, increased pain, nausea/vomiting, loss of bladder/bowel control. Feel better soon!  S. Lane HackerNicole Tonny Isensee, PA-C Back Pain, Adult Back pain is very common in adults.The cause of back pain is rarely dangerous and the pain often gets better over time.The cause of your back pain may not be known. Some common causes of back pain include:  Strain of the muscles or ligaments supporting the spine.  Wear and tear (degeneration) of the spinal disks.  Arthritis.  Direct injury to the back. For many people, back pain may return. Since back pain is rarely dangerous, most people can learn to manage this condition on their own. HOME CARE INSTRUCTIONS Watch your back pain for any changes. The following actions may help to lessen any discomfort you are feeling:  Remain active. It is stressful on your back to sit or stand in one place for long periods of time. Do not sit, drive, or stand in one place for more than 30 minutes at a time. Take short walks on even surfaces as soon as you are able.Try to increase the length of time you walk each day.  Exercise regularly as directed by your health care provider. Exercise helps your back heal faster. It also helps avoid future injury by keeping your muscles strong and flexible.  Do not stay in bed.Resting more than 1-2 days can delay your recovery.  Pay attention to your body when you bend and lift. The most comfortable positions are those that put less stress on your recovering back. Always use proper lifting techniques, including:  Bending your knees.  Keeping the load close to your body.  Avoiding twisting.  Find a comfortable position to sleep. Use a firm mattress and lie on your side with your knees slightly bent. If you lie on your back, put a pillow under your knees.  Avoid feeling anxious or  stressed.Stress increases muscle tension and can worsen back pain.It is important to recognize when you are anxious or stressed and learn ways to manage it, such as with exercise.  Take medicines only as directed by your health care provider. Over-the-counter medicines to reduce pain and inflammation are often the most helpful.Your health care provider may prescribe muscle relaxant drugs.These medicines help dull your pain so you can more quickly return to your normal activities and healthy exercise.  Apply ice to the injured area:  Put ice in a plastic bag.  Place a towel between your skin and the bag.  Leave the ice on for 20 minutes, 2-3 times a day for the first 2-3 days. After that, ice and heat may be alternated to reduce pain and spasms.  Maintain a healthy weight. Excess weight puts extra stress on your back and makes it difficult to maintain good posture. SEEK MEDICAL CARE IF:  You have pain that is not relieved with rest or medicine.  You have increasing pain going down into the legs or buttocks.  You have pain that does not improve in one week.  You have night pain.  You lose weight.  You have a fever or chills. SEEK IMMEDIATE MEDICAL CARE IF:   You develop new bowel or bladder control problems.  You have unusual weakness or numbness in your arms or legs.  You develop nausea or vomiting.  You develop abdominal pain.  You feel faint.   This information is not intended to  replace advice given to you by your health care provider. Make sure you discuss any questions you have with your health care provider.   Document Released: 08/21/2005 Document Revised: 09/11/2014 Document Reviewed: 12/23/2013 Elsevier Interactive Patient Education [employer].

## 2016-02-19 NOTE — ED Provider Notes (Signed)
CSN: [id number]     Arrival date & time 02/10/16  1107 History   First MD Initiated Contact with Patient 02/10/16 1505     Chief Complaint  Patient presents with  . Flank Pain   HPI  Angel [name] is a 50 y.o. male PMH significant for polysubstance abuse presenting with acute on chronic bilateral lower back pain. He describes his pain as 10/10 pain scale, intermittent, worsened with urination, nonradiating. He denies fevers, chills, nausea, vomiting, dysuria, hematuria, urinary frequency.  Past Medical History  Diagnosis Date  . History of cardiac arrest     a. 12/2011 PEA arrest in setting of drug/etoh use  . Nonischemic cardiomyopathy (HCC)     a. 12/2011 Echo: EF 20-25% (in setting of PEA arrest);  b. 01/2012 f/u Echo: EF 60-65%, 01/2012 Myoview: EF 58%, inf/infsept scar w/ anteroseptal/inflat ischemia;  d. 01/2012 Cath: NL Cors, EF 60-65%  . Aspiration pneumonia (HCC)     a. 12/2011 in setting of PEA arrest  . Polysubstance abuse     a. 12/2011 UDS + for cocaine, THC  . History of respiratory failure     a. 12/2011 in setting of PEA  . History of bacteremia     a. G. coccobacilli 12/2011  . Anoxic encephalopathy (HCC)     a. 12/2011  . Pulmonary infiltrate in right lung on chest x-ray     a. 01/2012  w/ rec for outpt f/u cxr   Past Surgical History  Procedure Laterality Date  . Cardiac catheterization  5/13    left heart with angiogram  . Left heart catheterization with coronary angiogram N/A 01/19/2012    Procedure: LEFT HEART CATHETERIZATION WITH CORONARY ANGIOGRAM;  Surgeon: Laurey Morale, MD;  Location: [facility] CATH LAB;  Service: Cardiovascular;  Laterality: N/A;   Family History  Problem Relation Age of Onset  . Heart disease Father    Social History  Substance Use Topics  . Smoking status: Current Every Day Smoker -- 1.00 packs/day for 35 years    Types: Cigarettes  . Smokeless tobacco: Never Used  . Alcohol Use: No    Review of Systems  Ten systems are reviewed and are  negative for acute change except as noted in the HPI  Allergies  Review of patient's allergies indicates no known allergies.  Home Medications   Prior to Admission medications   Medication Sig Start Date End Date Taking? Authorizing Provider  Aspirin-Acetaminophen-Caffeine (GOODY HEADACHE PO) Take 1 packet by mouth every 8 (eight) hours as needed (headache).   Yes Historical Provider, MD  sertraline (ZOLOFT) 50 MG tablet Take 1 tablet (50 mg total) by mouth daily. 10/09/12  Yes Dyann Kief, PA-C  traMADol (ULTRAM) 50 MG tablet Take 1 tablet (50 mg total) by mouth every 6 (six) hours as needed. 12/19/13  Yes Rodolph Bong, MD  cephALEXin (KEFLEX) 500 MG capsule Take 1 capsule (500 mg total) by mouth 4 (four) times daily. Patient not taking: Reported on 11/24/2015 08/01/14   Joycie Peek, PA-C  cyclobenzaprine (FLEXERIL) 10 MG tablet Take 1 tablet (10 mg total) by mouth at bedtime as needed for muscle spasms. Patient not taking: Reported on 08/01/2014 12/19/13   Rodolph Bong, MD  diclofenac (VOLTAREN) 75 MG EC tablet Take 1 tablet (75 mg total) by mouth 2 (two) times daily as needed. Patient not taking: Reported on 08/01/2014 12/19/13   Rodolph Bong, MD  doxycycline (VIBRA-TABS) 100 MG tablet Take 1 tablet (100 mg total)  by mouth 2 (two) times daily. Patient not taking: Reported on 02/10/2016 11/24/15   Julio Sicks, NP  HYDROcodone-acetaminophen (NORCO/VICODIN) 5-325 MG per tablet Take 2 tablets by mouth every 4 (four) hours as needed for moderate pain or severe pain. Patient not taking: Reported on 11/24/2015 08/01/14   Joycie Peek, PA-C  meloxicam (MOBIC) 7.5 MG tablet Take 1 tablet (7.5 mg total) by mouth 2 (two) times daily after a meal. Patient not taking: Reported on 11/24/2015 11/29/14   Linna Hoff, MD  methocarbamol (ROBAXIN) 500 MG tablet Take 1 tablet (500 mg total) by mouth 2 (two) times daily. 02/10/16   Melton Krebs, PA-C  naproxen (NAPROSYN) 500 MG tablet Take 1  tablet (500 mg total) by mouth 2 (two) times daily. 02/10/16   Melton Krebs, PA-C  predniSONE (DELTASONE) 10 MG tablet 4 tabs for 2 days, then 3 tabs for 2 days, 2 tabs for 2 days, then 1 tab for 2 days, then stop Patient not taking: Reported on 02/10/2016 11/24/15   Tammy S Parrett, NP   BP 122/75 mmHg  Pulse 65  Temp(Src) 98.1 F (36.7 C) (Oral)  Resp 16  Ht 6\' 3"  (1.905 m)  Wt 81.647 kg  BMI 22.50 kg/m2  SpO2 100% Physical Exam  Constitutional: He appears well-developed and well-nourished. No distress.  HENT:  Head: Normocephalic and atraumatic.  Mouth/Throat: Oropharynx is clear and moist. No oropharyngeal exudate.  Eyes: Conjunctivae are normal. Pupils are equal, round, and reactive to light. Right eye exhibits no discharge. Left eye exhibits no discharge. No scleral icterus.  Neck: No tracheal deviation present.  Cardiovascular: Normal rate, regular rhythm, normal heart sounds and intact distal pulses.  Exam reveals no gallop and no friction rub.   No murmur heard. Pulmonary/Chest: Effort normal and breath sounds normal. No respiratory distress. He has no wheezes. He has no rales. He exhibits no tenderness.  Abdominal: Soft. Bowel sounds are normal. He exhibits no distension and no mass. There is tenderness. There is no rebound and no guarding.  BL CVA tenderness, LLQ tenderness  Musculoskeletal: He exhibits no edema.  Lymphadenopathy:    He has no cervical adenopathy.  Neurological: He is alert. Coordination normal.  Skin: Skin is warm and dry. No rash noted. He is not diaphoretic. No erythema.  Psychiatric: He has a normal mood and affect. His behavior is normal.  Nursing note and vitals reviewed.   ED Course  Procedures  Labs Review Labs Reviewed  BASIC METABOLIC PANEL - Abnormal; Notable for the following:    Glucose, Bld 107 (*)    BUN 24 (*)    All other components within normal limits  URINALYSIS, ROUTINE W REFLEX MICROSCOPIC (NOT AT [facility])  CBC  LIPASE,  BLOOD   Imaging Review Ct Abdomen Pelvis W Contrast  02/10/2016  CLINICAL DATA:  Bilateral lower quadrant and back pain with diarrhea for 2 months. EXAM: CT ABDOMEN AND PELVIS WITH CONTRAST TECHNIQUE: Multidetector CT imaging of the abdomen and pelvis was performed using the standard protocol following bolus administration of intravenous contrast. CONTRAST:  1 ISOVUE-300 IOPAMIDOL (ISOVUE-300) INJECTION 61% COMPARISON:  None. FINDINGS: Normal lung bases. No free air or free fluid. Liver, gallbladder, portal vein, spleen, adrenal glands, kidneys, and pancreas are normal. The abdominal aorta is non aneurysmal. There is atherosclerosis distally in the aorta. No adenopathy. The stomach and small bowel are normal. The colon is unremarkable. The appendix is well seen with no appendicitis. The pelvis demonstrates no adenopathy or  mass. The prostate, seminal vessels, and bladder are normal. There is a lytic lesion in the right iliac bone on axial image 65 of doubtful acute significance. This is probably a benign lesion. No other bony abnormality of importance are identified. Delayed images through the upper abdomen demonstrate no filling defects in the renal collecting systems. IMPRESSION: 1. No acute abnormalities. Electronically Signed   By: Gerome Samavid  Williams III M.D   On: 02/10/2016 18:53   I have personally reviewed and evaluated these images and lab results as part of my medical decision-making.  MDM   Final diagnoses:  Bilateral low back pain without sciatica   Patient nontoxic appearing, VSS. Will CT given new LLQ tenderness.  CT unremarkable. Patient feeling improved after vicodin and fluid bolus. Patient may be safely discharged home. Discussed reasons for return. Patient to follow-up with primary care provider within one week. Patient in understanding and agreement with the plan.   Melton KrebsSamantha Nicole Kadesha Virrueta, PA-C 02/20/16 [id number]  Geoffery Lyonsouglas Delo, MD 02/21/16 [phone]

## 2016-06-02 ENCOUNTER — Encounter (HOSPITAL_COMMUNITY): Payer: Self-pay | Admitting: Emergency Medicine

## 2016-06-02 ENCOUNTER — Emergency Department (HOSPITAL_COMMUNITY)
Admission: EM | Admit: 2016-06-02 | Discharge: 2016-06-03 | Disposition: A | Discharge status: Home / Self Care | Payer: Medicaid Other | Attending: Emergency Medicine | Admitting: Emergency Medicine

## 2016-06-02 DIAGNOSIS — Z7982 Long term (current) use of aspirin: Secondary | ICD-10-CM | POA: Diagnosis not present

## 2016-06-02 DIAGNOSIS — F17210 Nicotine dependence, cigarettes, uncomplicated: Secondary | ICD-10-CM | POA: Insufficient documentation

## 2016-06-02 DIAGNOSIS — M79645 Pain in left finger(s): Secondary | ICD-10-CM | POA: Diagnosis present

## 2016-06-02 DIAGNOSIS — L02512 Cutaneous abscess of left hand: Secondary | ICD-10-CM | POA: Diagnosis not present

## 2016-06-02 DIAGNOSIS — IMO0001 Reserved for inherently not codable concepts without codable children: Secondary | ICD-10-CM

## 2016-06-02 MED ORDER — LIDOCAINE HCL 2 % IJ SOLN
10.0000 mL | Freq: Once | INTRAMUSCULAR | Status: AC
  Administered 2016-06-02: 200 mg via INTRADERMAL
  Filled 2016-06-02: qty 20

## 2016-06-02 NOTE — ED Triage Notes (Signed)
Reports abscess to L hand starting in left proximal middle digit and radiates to hand. Limited mobility. Denies fevers.

## 2016-06-02 NOTE — ED Notes (Signed)
Suture cart placed at bedside for PA.  

## 2016-06-02 NOTE — ED Provider Notes (Signed)
[facility]-EMERGENCY DEPT Provider Note   CSN: [id number] Arrival date & time: 06/02/16  2241  By signing my name below, I, Majel HomerPeyton Lee, attest that this documentation has been prepared under the direction and in the presence of non-physician practitioner, Fayrene HelperBowie Aubreigh Fuerte, PA-C. Electronically Signed: Majel HomerPeyton Lee, Scribe. 06/02/2016. 11:23 PM.  History   Chief Complaint Chief Complaint  Patient presents with  . Insect Bite   The history is provided by the patient. No language interpreter was used.   HPI Comments: Angel [name] is a 50 y.o. male who presents to the Emergency Department for an evaluation of gradually worsening, swelling, pain and numbness in his left third digit that began 3 days go. Pt reports his pain begins in his left third digit and radiates into his left hand. He states his left knuckles feel "sore" and he has noticed minimal clear drainage from his finger recently. He notes he has applied hydrogen peroxide to his finger with no relief. Pt denies any recent insect bite to his finger. He is unsure of his late tetanus immunization.   Past Medical History:  Diagnosis Date  . Anoxic encephalopathy (HCC)    a. 12/2011  . Aspiration pneumonia (HCC)    a. 12/2011 in setting of PEA arrest  . History of bacteremia    a. G. coccobacilli 12/2011  . History of cardiac arrest    a. 12/2011 PEA arrest in setting of drug/etoh use  . History of respiratory failure    a. 12/2011 in setting of PEA  . Nonischemic cardiomyopathy (HCC)    a. 12/2011 Echo: EF 20-25% (in setting of PEA arrest);  b. 01/2012 f/u Echo: EF 60-65%, 01/2012 Myoview: EF 58%, inf/infsept scar w/ anteroseptal/inflat ischemia;  d. 01/2012 Cath: NL Cors, EF 60-65%  . Polysubstance abuse    a. 12/2011 UDS + for cocaine, THC  . Pulmonary infiltrate in right lung on chest x-ray    a. 01/2012  w/ rec for outpt f/u cxr    Patient Active Problem List   Diagnosis Date Noted  . Acute bronchitis 11/24/2015  . H/O drug dependence/abuse  (HCC) 03/20/2014  . Tobacco dependence 03/20/2014  . Other malaise and fatigue 03/20/2014  . Knee pain, bilateral 03/20/2014  . Immunization due 03/20/2014  . Need for Tdap vaccination 03/20/2014  . Spasm of muscle 03/20/2014  . Hypersomnia 11/12/2013  . Snoring 09/17/2013  . Other emphysema (HCC) 09/17/2013  . Smoking 09/17/2013  . Palpitations 10/09/2012  . Chest pain 10/09/2012  . Dyspnea 03/15/2012  . Smoker 03/15/2012  . History of cardiac arrest   . Nonischemic cardiomyopathy (HCC)   . Aspiration pneumonia (HCC)   . Polysubstance abuse   . History of respiratory failure   . History of bacteremia   . Pulmonary infiltrate in right lung on chest x-ray   . 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  . DVT of upper extremity (deep vein thrombosis), left 01/11/2012  . Dysphagia 01/11/2012  . Cardiomyopathy secondary 01/04/2012  . Thrombocytopenia (HCC) 12/31/2011  . Acute respiratory failure (HCC) 12/25/2011  . Pneumonia, organism unspecified 12/25/2011  . Acidosis 12/25/2011  . Altered mental status 12/25/2011  . Cardiac arrest (HCC) 12/25/2011  . Anoxic encephalopathy (HCC) 12/25/2011  . No significant past medical history     Past Surgical History:  Procedure Laterality Date  . CARDIAC CATHETERIZATION  5/13   left heart with angiogram  . LEFT HEART CATHETERIZATION WITH CORONARY ANGIOGRAM N/A 01/19/2012   Procedure: LEFT HEART CATHETERIZATION WITH CORONARY ANGIOGRAM;  Surgeon: Laurey Morale, MD;  Location: [facility] CATH LAB;  Service: Cardiovascular;  Laterality: N/A;     Home Medications    Prior to Admission medications   Medication Sig Start Date End Date Taking? Authorizing Provider  Aspirin-Acetaminophen-Caffeine (GOODY HEADACHE PO) Take 1 packet by mouth every 8 (eight) hours as needed (headache).    Historical Provider, MD  cephALEXin (KEFLEX) 500 MG capsule Take 1 capsule (500 mg total) by mouth 4 (four) times daily. Patient not taking: Reported on 11/24/2015 08/01/14    Joycie Peek, PA-C  cyclobenzaprine (FLEXERIL) 10 MG tablet Take 1 tablet (10 mg total) by mouth at bedtime as needed for muscle spasms. Patient not taking: Reported on 08/01/2014 12/19/13   Rodolph Bong, MD  diclofenac (VOLTAREN) 75 MG EC tablet Take 1 tablet (75 mg total) by mouth 2 (two) times daily as needed. Patient not taking: Reported on 08/01/2014 12/19/13   Rodolph Bong, MD  doxycycline (VIBRA-TABS) 100 MG tablet Take 1 tablet (100 mg total) by mouth 2 (two) times daily. Patient not taking: Reported on 02/10/2016 11/24/15   Julio Sicks, NP  HYDROcodone-acetaminophen (NORCO/VICODIN) 5-325 MG per tablet Take 2 tablets by mouth every 4 (four) hours as needed for moderate pain or severe pain. Patient not taking: Reported on 11/24/2015 08/01/14   Joycie Peek, PA-C  meloxicam (MOBIC) 7.5 MG tablet Take 1 tablet (7.5 mg total) by mouth 2 (two) times daily after a meal. Patient not taking: Reported on 11/24/2015 11/29/14   Linna Hoff, MD  methocarbamol (ROBAXIN) 500 MG tablet Take 1 tablet (500 mg total) by mouth 2 (two) times daily. 02/10/16   Melton Krebs, PA-C  naproxen (NAPROSYN) 500 MG tablet Take 1 tablet (500 mg total) by mouth 2 (two) times daily. 02/10/16   Melton Krebs, PA-C  predniSONE (DELTASONE) 10 MG tablet 4 tabs for 2 days, then 3 tabs for 2 days, 2 tabs for 2 days, then 1 tab for 2 days, then stop Patient not taking: Reported on 02/10/2016 11/24/15   Tammy S Parrett, NP  sertraline (ZOLOFT) 50 MG tablet Take 1 tablet (50 mg total) by mouth daily. 10/09/12   Dyann Kief, PA-C  traMADol (ULTRAM) 50 MG tablet Take 1 tablet (50 mg total) by mouth every 6 (six) hours as needed. 12/19/13   Rodolph Bong, MD    Family History Family History  Problem Relation Age of Onset  . Heart disease Father     Social History Social History  Substance Use Topics  . Smoking status: Current Every Day Smoker    Packs/day: 1.00    Years: 35.00    Types: Cigarettes  .  Smokeless tobacco: Never Used  . Alcohol use No     Allergies   Review of patient's allergies indicates no known allergies.   Review of Systems Review of Systems  Musculoskeletal: Positive for arthralgias and joint swelling (left third digit).  Neurological: Positive for numbness.   Physical Exam Updated Vital Signs BP 114/72 (BP Location: Right Arm)   Pulse 87   Temp 97.9 F (36.6 C) (Oral)   Resp 16   Ht 6\' 3"  (1.905 m)   Wt 180 lb (81.6 kg)   SpO2 99%   BMI 22.50 kg/m   Physical Exam  Constitutional: He is oriented to person, place, and time. He appears well-developed and well-nourished.  HENT:  Head: Normocephalic.  Eyes: EOM are normal.  Neck: Normal range of motion.  Pulmonary/Chest: Effort normal.  Abdominal: He exhibits no distension.  Musculoskeletal: Normal range of motion.  Left hand middle finger: significant swelling noted to the proximal phalanx that is TTP. Decreased finger flexion and extension secondary to pain. Brisk cap refill. Sensation is mildly diminished to light touch. Mild erythema noted to the palms of hand without any lymphangitis. Radial pulses 2+.   Neurological: He is alert and oriented to person, place, and time.  Psychiatric: He has a normal mood and affect.  Nursing note and vitals reviewed.         ED Treatments / Results  Labs (all labs ordered are listed, but only abnormal results are displayed) Labs Reviewed  AEROBIC CULTURE (SUPERFICIAL SPECIMEN)    EKG  EKG Interpretation None       Radiology No results found.  Procedures Procedures (including critical care time) INCISION AND DRAINAGE PROCEDURE NOTE: Patient identification was confirmed and verbal consent was obtained. This procedure was performed by Fayrene Helper, PA-C at 11:57 PM. Site: L middle finger, proximal phalanx (lateral) Sterile procedures observed Needle size: 25 Anesthetic used (type and amt): 4ml, digital nerve block Blade size: 11 Drainage:  mild Complexity: Complex Packing used: 1/4inch Site anesthetized, incision made over site, wound drained and explored loculations, rinsed with copious amounts of normal saline, wound packed with sterile gauze, covered with dry, sterile dressing.  Pt tolerated procedure well without complications.  Instructions for care discussed verbally and pt provided with additional written instructions for homecare and f/u.  Medications Ordered in ED Medications  doxycycline (VIBRA-TABS) tablet 100 mg (not administered)  HYDROcodone-acetaminophen (NORCO/VICODIN) 5-325 MG per tablet 1 tablet (not administered)  lidocaine (XYLOCAINE) 2 % (with pres) injection 200 mg (200 mg Intradermal Given 06/02/16 2329)    DIAGNOSTIC STUDIES:  Oxygen Saturation is 99% on RA, normal by my interpretation.    COORDINATION OF CARE:  11:22 PM Discussed treatment plan with pt at bedside and pt agreed to plan.  Initial Impression / Assessment and Plan / ED Course  I have reviewed the triage vital signs and the nursing notes.  Pertinent labs & imaging results that were available during my care of the patient were reviewed by me and considered in my medical decision making (see chart for details).  Clinical Course    BP 114/72 (BP Location: Right Arm)   Pulse 87   Temp 97.9 F (36.6 C) (Oral)   Resp 16   Ht 6\' 3"  (1.905 m)   Wt 81.6 kg   SpO2 99%   BMI 22.50 kg/m    I personally performed the services described in this documentation, which was scribed in my presence. The recorded information has been reviewed and is accurate.   Final Clinical Impressions(s) / ED Diagnoses   Final diagnoses:  Abscess of third finger, left    New Prescriptions Current Discharge Medication List     12:10 AM Pt developed abscess cellulitis of the L middle finger at the proximal phalanx laterally. No pulp involvement.  No joint involvement.  Successful I&D with pustular discharge.  Pt placed on doxy and pain  medication.  Wound care discussed.  Packing placed.  Pt to f/u in 48 hrs for wound recheck and packing removal.  Referral to hand surgery given.  Wound culture obtained.  Doubt ligament/tendon involvement.  Doubt septic joint. tdap given.    I have checked [place] control substance report system, and pt currently receiving tramadol on a regular basis by his PCP.  Therefore, I will hold off on prescribing pain  meds.      Fayrene Helper, PA-C 06/03/16 0020    Rolland Porter, MD 06/08/16 [phone]

## 2016-06-03 MED ORDER — TETANUS-DIPHTH-ACELL PERTUSSIS 5-2.5-18.5 LF-MCG/0.5 IM SUSP
0.5000 mL | Freq: Once | INTRAMUSCULAR | Status: AC
  Administered 2016-06-03: 0.5 mL via INTRAMUSCULAR
  Filled 2016-06-03: qty 0.5

## 2016-06-03 MED ORDER — DOXYCYCLINE HYCLATE 100 MG PO TABS: 100.0000 mg | ORAL_TABLET | Freq: Two times a day (BID) | ORAL | 0 refills | Status: DC

## 2016-06-03 MED ORDER — HYDROCODONE-ACETAMINOPHEN 5-325 MG PO TABS: 1.0000 | ORAL_TABLET | Freq: Four times a day (QID) | ORAL | 0 refills | Status: DC | PRN

## 2016-06-03 MED ORDER — DOXYCYCLINE HYCLATE 100 MG PO TABS
100.0000 mg | ORAL_TABLET | Freq: Once | ORAL | Status: AC
  Administered 2016-06-03: 100 mg via ORAL
  Filled 2016-06-03: qty 1

## 2016-06-03 MED ORDER — NAPROXEN 500 MG PO TABS: 500.0000 mg | ORAL_TABLET | Freq: Two times a day (BID) | ORAL | 0 refills | Status: DC

## 2016-06-03 MED ORDER — HYDROCODONE-ACETAMINOPHEN 5-325 MG PO TABS
1.0000 | ORAL_TABLET | Freq: Once | ORAL | Status: AC
  Administered 2016-06-03: 1 via ORAL
  Filled 2016-06-03: qty 1

## 2016-06-03 NOTE — Discharge Instructions (Signed)
You have an infection involving the finger of your left hand.  Apply warm compress to affected area several times a day.  Follow up with your doctor or return in 48 hrs for wound recheck.  Take antibiotic and pain medication as directed.  Return sooner if you have any concerns.

## 2016-06-03 NOTE — ED Notes (Signed)
Pt held after doxycycline given and no reaction. NAD. VSS.

## 2016-06-03 NOTE — ED Notes (Signed)
Pt verbalized understanding of d/c instructions and has no further questions. Pt stable and NAD. Pt d/c home with mother driving.  

## 2016-06-05 LAB — AEROBIC CULTURE  (SUPERFICIAL SPECIMEN)

## 2016-06-05 LAB — AEROBIC CULTURE W GRAM STAIN (SUPERFICIAL SPECIMEN)

## 2016-06-06 ENCOUNTER — Telehealth (HOSPITAL_BASED_OUTPATIENT_CLINIC_OR_DEPARTMENT_OTHER): Payer: Self-pay | Admitting: Emergency Medicine

## 2016-06-06 NOTE — Telephone Encounter (Signed)
Post ED Visit - Positive Culture Follow-up  Culture report reviewed by antimicrobial stewardship pharmacist:  []  Enzo BiNathan Batchelder, Pharm.D. []  Celedonio MiyamotoJeremy Frens, [address]Ph[address]D., BCPS []  Garvin FilaMike Maccia, Pharm.D. []  Georgina PillionElizabeth Martin, Pharm.D., BCPS []  [place] Lake[place] Pham, [address]Ph[address]D., BCPS, AAHIVP []  Estella HuskMichelle Turner, Pharm.D., BCPS, AAHIVP []  Tennis Mustassie Stewart, Pharm.D. []  Sherle Poeob Vincent, [address]Ph[address] Casilda Carlsaylor Stone PharmD  Positive wound culture Treated with doxycycline, organism sensitive to the same and no further patient follow-up is required at this time.  Berle MullMiller, Brad Lieurance 06/06/2016, 9:54 AM

## 2016-08-07 ENCOUNTER — Other Ambulatory Visit: Payer: Self-pay | Admitting: Physician Assistant

## 2016-08-07 DIAGNOSIS — G894 Chronic pain syndrome: Secondary | ICD-10-CM

## 2016-08-17 ENCOUNTER — Other Ambulatory Visit: Payer: Medicaid Other

## 2017-02-21 ENCOUNTER — Encounter: Payer: Self-pay | Admitting: Neurology

## 2017-02-21 ENCOUNTER — Ambulatory Visit (INDEPENDENT_AMBULATORY_CARE_PROVIDER_SITE_OTHER): Payer: Medicaid Other | Admitting: Neurology

## 2017-02-21 VITALS — BP 115/81 | HR 83 | Ht 75.0 in | Wt 176.0 lb

## 2017-02-21 DIAGNOSIS — G931 Anoxic brain damage, not elsewhere classified: Secondary | ICD-10-CM | POA: Diagnosis not present

## 2017-02-21 DIAGNOSIS — G2581 Restless legs syndrome: Secondary | ICD-10-CM | POA: Diagnosis not present

## 2017-02-21 HISTORY — DX: Restless legs syndrome: G25.81

## 2017-02-21 MED ORDER — PRAMIPEXOLE DIHYDROCHLORIDE 0.25 MG PO TABS: 0.2500 mg | ORAL_TABLET | Freq: Every day | ORAL | 3 refills | Status: DC

## 2017-02-21 NOTE — Patient Instructions (Signed)
   We will start mirapex 0.25 mg at night for the restless legs. Call for any dose adjustments.  We will get neuropsychological testing.

## 2017-02-21 NOTE — Progress Notes (Signed)
Reason for visit: Memory disturbance  Referring physician: Dr. Saundra Shelling [name] is a 51 y.o. male  History of present illness:  Angel [name] is a 51 year old right-handed white male with a history of polysubstance abuse and alcohol abuse. The patient was admitted to the hospital around 12/24/2011 with an opiate overdose that ultimately resulted in a cardiac and respiratory arrest. The patient was admitted to the hospital and required ventilator assistance and eventually a tracheotomy. Following this illness, the patient has had an issue with cognitive functioning. He improved significantly from the initial confusion, but his memory has never returned to normal. The patient will have good days and bad days with cognitive functioning. He indicates that he has significant problems with restless leg syndrome at night, he does not sleep well, he also snores at night. The patient indicates that he has not driven a car since 2013, he requires assistance from his mother and daughter with keeping up with medications and appointments and with doing the finances. The patient has difficulty with word finding and with remembering names for people. He has some short-term memory issues as well. He has not been able to maintain gainful employment, he will be applying for disability in the near future. The patient takes Ultram for chronic knee discomfort, but he otherwise is not taking any other opiate medications and he does not drink alcohol. He is followed through a pain clinic and he gets regular urine drug screens done. The patient reports some mild imbalance problems, he has not had any falls. He denies any numbness or weakness of the extremities or difficulty controlling the bowels or the bladder. He does have some chronic fatigue issues as he does not sleep well at night. He is sent to this office for an evaluation.  Past Medical History:  Diagnosis Date  . Anoxic encephalopathy (HCC)    a. 12/2011    . Aspiration pneumonia (HCC)    a. 12/2011 in setting of PEA arrest  . History of bacteremia    a. G. coccobacilli 12/2011  . History of cardiac arrest    a. 12/2011 PEA arrest in setting of drug/etoh use  . History of respiratory failure    a. 12/2011 in setting of PEA  . Nonischemic cardiomyopathy (HCC)    a. 12/2011 Echo: EF 20-25% (in setting of PEA arrest);  b. 01/2012 f/u Echo: EF 60-65%, 01/2012 Myoview: EF 58%, inf/infsept scar w/ anteroseptal/inflat ischemia;  d. 01/2012 Cath: NL Cors, EF 60-65%  . Polysubstance abuse    a. 12/2011 UDS + for cocaine, THC  . Pulmonary infiltrate in right lung on chest x-ray    a. 01/2012  w/ rec for outpt f/u cxr    Past Surgical History:  Procedure Laterality Date  . CARDIAC CATHETERIZATION  5/13   left heart with angiogram  . LEFT HEART CATHETERIZATION WITH CORONARY ANGIOGRAM N/A 01/19/2012   Procedure: LEFT HEART CATHETERIZATION WITH CORONARY ANGIOGRAM;  Surgeon: Laurey Morale, MD;  Location: [facility] CATH LAB;  Service: Cardiovascular;  Laterality: N/A;    Family History  Problem Relation Age of Onset  . Heart disease Father     Social history:  reports that he has quit smoking. His smoking use included Cigarettes. He has a 35.00 pack-year smoking history. He has never used smokeless tobacco. He reports that he does not drink alcohol or use drugs.  Medications:  Prior to Admission medications   Medication Sig Start Date End Date Taking? Authorizing Provider  sertraline (ZOLOFT) 50 MG tablet Take 1 tablet (50 mg total) by mouth daily. 10/09/12  Yes Dyann KiefLenze, Michele M, PA-C  traMADol (ULTRAM) 50 MG tablet Take 1 tablet (50 mg total) by mouth every 6 (six) hours as needed. 12/19/13  Yes Rodolph Bongorey, Evan S, MD  Aspirin-Acetaminophen-Caffeine (GOODY HEADACHE PO) Take 1 packet by mouth every 8 (eight) hours as needed (headache).    [provider]  cephALEXin (KEFLEX) 500 MG capsule Take 1 capsule (500 mg total) by mouth 4 (four) times daily. Patient  not taking: Reported on 11/24/2015 08/01/14   Joycie Peekartner, Benjamin, PA-C  cyclobenzaprine (FLEXERIL) 10 MG tablet Take 1 tablet (10 mg total) by mouth at bedtime as needed for muscle spasms. Patient not taking: Reported on 08/01/2014 12/19/13   Rodolph Bongorey, Evan S, MD  diclofenac (VOLTAREN) 75 MG EC tablet Take 1 tablet (75 mg total) by mouth 2 (two) times daily as needed. Patient not taking: Reported on 08/01/2014 12/19/13   Rodolph Bongorey, Evan S, MD  doxycycline (VIBRA-TABS) 100 MG tablet Take 1 tablet (100 mg total) by mouth 2 (two) times daily. Patient not taking: Reported on 02/21/2017 06/03/16   Fayrene Helperran, Bowie, PA-C  meloxicam (MOBIC) 7.5 MG tablet Take 1 tablet (7.5 mg total) by mouth 2 (two) times daily after a meal. Patient not taking: Reported on 11/24/2015 11/29/14   Linna HoffKindl, James D, MD  methocarbamol (ROBAXIN) 500 MG tablet Take 1 tablet (500 mg total) by mouth 2 (two) times daily. Patient not taking: Reported on 02/21/2017 02/10/16   Melton Krebsiley, Samantha Nicole, PA-C  naproxen (NAPROSYN) 500 MG tablet Take 1 tablet (500 mg total) by mouth 2 (two) times daily. Patient not taking: Reported on 02/21/2017 06/03/16   Fayrene Helperran, Bowie, PA-C  predniSONE (DELTASONE) 10 MG tablet 4 tabs for 2 days, then 3 tabs for 2 days, 2 tabs for 2 days, then 1 tab for 2 days, then stop Patient not taking: Reported on 02/10/2016 11/24/15   Parrett, Virgel Bouquetammy S, NP     No Known Allergies  ROS:  Out of a complete 14 system review of symptoms, the patient complains only of the following symptoms, and all other reviewed systems are negative.  Memory loss, headache Sleepiness, snoring, restless legs Depression, anxiety, decreased energy, racing thoughts  Blood pressure 115/81, pulse 83, height 6\' 3"  (1.905 m), weight 176 lb (79.8 kg).  Physical Exam  General: The patient is alert and cooperative at the time of the examination.  Eyes: Pupils are equal, round, and reactive to light. Discs are flat bilaterally.  Neck: The neck is supple, no carotid  bruits are noted.  Respiratory: The respiratory examination is clear.  Cardiovascular: The cardiovascular examination reveals a regular rate and rhythm, no obvious murmurs or rubs are noted.  Skin: Extremities are without significant edema.  Neurologic Exam  Mental status: The patient is alert and oriented x 3 at the time of the examination. The patient has apparent normal recent and remote memory, with an apparently normal attention span and concentration ability. Mini-Mental Status Examination done today shows a total score 28/30.  Cranial nerves: Facial symmetry is present. There is good sensation of the face to pinprick and soft touch bilaterally. The strength of the facial muscles and the muscles to head turning and shoulder shrug are normal bilaterally. Speech is well enunciated, no aphasia or dysarthria is noted. Extraocular movements are full. Visual fields are full. The tongue is midline, and the patient has symmetric elevation of the soft palate. No obvious hearing deficits are  noted.  Motor: The motor testing reveals 5 over 5 strength of all 4 extremities. Good symmetric motor tone is noted throughout.  Sensory: Sensory testing is intact to pinprick, soft touch, vibration sensation, and position sense on all 4 extremities. No evidence of extinction is noted.  Coordination: Cerebellar testing reveals good finger-nose-finger and heel-to-shin bilaterally.  Gait and station: Gait is normal. Tandem gait is normal. Romberg is negative. No drift is seen.  Reflexes: Deep tendon reflexes are symmetric and normal bilaterally. Toes are downgoing bilaterally.   Assessment/Plan:  1. Post anoxic encephalopathy  2. Restless leg syndrome  The patient has had recent blood work that includes a thyroid profile and a B12 level that were unremarkable. We will set him up for MRI of the brain, and he will have formal neuropsychological evaluation. For the restless leg syndrome, he will start  Mirapex at 0.25 mg at night. He will follow-up in 6 months.   Angel Palau MD 02/21/2017 9:46 AM  [facility] [address] [place], [place] [address]  Phone [phone] Fax [phone]

## 2017-02-26 ENCOUNTER — Encounter: Payer: Self-pay | Admitting: Psychology

## 2017-04-10 ENCOUNTER — Ambulatory Visit (INDEPENDENT_AMBULATORY_CARE_PROVIDER_SITE_OTHER): Payer: Medicaid Other | Admitting: Psychology

## 2017-04-10 ENCOUNTER — Encounter: Payer: Self-pay | Admitting: Psychology

## 2017-04-10 DIAGNOSIS — G931 Anoxic brain damage, not elsewhere classified: Secondary | ICD-10-CM

## 2017-04-10 DIAGNOSIS — R413 Other amnesia: Secondary | ICD-10-CM

## 2017-04-10 NOTE — Progress Notes (Signed)
NEUROPSYCHOLOGICAL INTERVIEW (CPT: [id number])  Name: Angel [name] Date of Birth: [birth year] Date of Interview: 04/10/2017  Reason for Referral:  Angel [name] is a 51 y.o. right handed male who is referred for neuropsychological evaluation by Dr. Anne Hahn of [facility] due to concerns about cognitive changes status post anoxic encephalopathy. This patient is unaccompanied in the office for today's visit.  History of Presenting Problem:  Angel [name] has a history of polysubstance abuse (in remission) with methadone overdose on 12/24/2011 that resulted in cardiac and respiratory arrest, requiring ventilator and then tracheotomy. He had significant cognitive dysfunction following his approximately 3-week hospitalization and while his acute confusion cleared, his memory function neve returned to baseline. He does not believe he had any outpatient therapies such as PT/OT/ST. He has been unable to work since his hospitalization (he was previously doing Musician work). He denied any substance abuse or relapse since his hospitalization. He is prescribed tramadol for knee pain but is monitored closely by his pain clinic with regular urine drug tests. He was seen by Dr. Anne Hahn for neurologic consultation on 02/21/2017. MMSE was 28/30. The patient has insomnia with restless legs. Mirapex was started by Dr. Anne Hahn, and the patient has been taking this. Brain MRI has been ordered but not yet completed. Angel [name] also saw a psychiatrist at the [facility] for evaluation of attention deficits and was prescribed a medication (does not recall the name of it) but he could not tolerate it and d/c'ed it.  Upon direct questioning, the patient reported the following with regard to current cognitive functioning, and notes that these symptoms were not present until his overdose and cardiac/respiratory arrest in 2013:  Forgetting recent conversations/events: Yes Repeating statements/questions:  Yes Misplacing/losing items: Yes Forgetting appointments or other obligations: Little bit Forgetting to take medications: Yes sometimes Difficulty concentrating: Yes Starting but not finishing tasks: Yes Distracted easily: Yes Processing information more slowly: Yes Word-finding difficulty: Little bit Writing difficulty: No Comprehension difficulty: Yes  Angel [name] lives with his 2 year old daughter. He has not driven since 2013. He manages his medications independently. His daughter helps with management of finances and appointments. He does simple meal preparation. He is unemployed, not on disability currently.  The patient reported that he continues to have sleep difficulty. He gets about 5-6 hours of interrupted sleep. He does doze off during the day. He denies any major physical complaints. He reports good appetite. He reports that his mood has been good in general. He does go through "stages" of worrying excessively but it is not constant. He denies depressed mood or suicidal ideation/intention.  He denies history of diagnosed concussion or TBI. He did play football and likely had a few mild concussions (no LOC) but never sought medical attention.   Psychiatric History: The patient reported that he has been on Zoloft since around 2014. He has had no other mental health treatment, aside from his recent psychiatric evaluation for attention difficulties (these records are not currently available for my review). He denied any history of suicidal ideation or intention.  He has a history of polysubstance abuse since his early 30s, mostly opiates. He never had a problem with alcohol. He never participated in any substance abuse treatment programs.   Social History: Born/Raised: [place] Education: 10th grade. He does not think he had any learning difficulties but he was a "class clown" and frequently in trouble for minor issues, never anything major. He did repeat one grade level. 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 He is divorced. Children: 3 daughters, all adults. 1 grandchild. Alcohol: None Tobacco: Smokes about a pack of cigarettes a day, goes back and forth between vape and cigarettes Denies misuse of his tramadol and denies any other substance use.   Medical History: Past Medical History:  Diagnosis Date  . Anoxic encephalopathy (HCC)    a. 12/2011  . Aspiration pneumonia (HCC)    a. 12/2011 in setting of PEA arrest  . History of bacteremia    a. G. coccobacilli 12/2011  . History of cardiac arrest    a. 12/2011 PEA arrest in setting of drug/etoh use  . History of respiratory failure    a. 12/2011 in setting of PEA  . Nonischemic cardiomyopathy (HCC)    a. 12/2011 Echo: EF 20-25% (in setting of PEA arrest);  b. 01/2012 f/u Echo: EF 60-65%, 01/2012 Myoview: EF 58%, inf/infsept scar w/ anteroseptal/inflat ischemia;  d. 01/2012 Cath: NL Cors, EF 60-65%  . Polysubstance abuse    a. 12/2011 UDS + for cocaine, THC  . Pulmonary infiltrate in right lung on chest x-ray    a. 01/2012  w/ rec for outpt f/u cxr  . RLS (restless legs syndrome) 02/21/2017     Current Medications:  Outpatient Encounter Prescriptions as of 04/10/2017  Medication Sig  . Aspirin-Acetaminophen-Caffeine (GOODY HEADACHE PO) Take 1 packet by mouth every 8 (eight) hours as needed (headache).  . cephALEXin (KEFLEX) 500 MG capsule Take 1 capsule (500 mg total) by mouth 4 (four) times daily. (Patient not taking: Reported on 11/24/2015)  . cyclobenzaprine (FLEXERIL) 10 MG tablet Take 1 tablet (10 mg total) by mouth at bedtime as needed for muscle spasms. (Patient not taking: Reported on 08/01/2014)  . diclofenac (VOLTAREN) 75 MG EC tablet Take 1 tablet (75 mg total) by mouth 2 (two) times daily as needed. (Patient not taking: Reported on 08/01/2014)  . doxycycline (VIBRA-TABS) 100 MG tablet Take 1 tablet (100 mg total) by  mouth 2 (two) times daily. (Patient not taking: Reported on 02/21/2017)  . meloxicam (MOBIC) 7.5 MG tablet Take 1 tablet (7.5 mg total) by mouth 2 (two) times daily after a meal. (Patient not taking: Reported on 11/24/2015)  . methocarbamol (ROBAXIN) 500 MG tablet Take 1 tablet (500 mg total) by mouth 2 (two) times daily. (Patient not taking: Reported on 02/21/2017)  . naproxen (NAPROSYN) 500 MG tablet Take 1 tablet (500 mg total) by mouth 2 (two) times daily. (Patient not taking: Reported on 02/21/2017)  . pramipexole (MIRAPEX) 0.25 MG tablet Take 1 tablet (0.25 mg total) by mouth at bedtime.  . predniSONE (DELTASONE) 10 MG tablet 4 tabs for 2 days, then 3 tabs for 2 days, 2 tabs for 2 days, then 1 tab for 2 days, then stop (Patient not taking: Reported on 02/10/2016)  . sertraline (ZOLOFT) 50 MG tablet Take 1 tablet (50 mg total) by mouth daily.  . traMADol (ULTRAM) 50 MG tablet Take 1 tablet (50 mg total) by mouth every 6 (six) hours as needed.   No facility-administered encounter medications on file as of 04/10/2017.      Behavioral Observations:   Appearance: Appropriately dressed and groomed Gait: Ambulated independently, no gross abnormalities observed Speech: Fluent; normal rate, rhythm and volume. No significant word finding difficulty. Thought process: Linear, goal directed Affect: Normal range, euthymic Interpersonal: Pleasant, appropriate   TESTING: There is medical necessity to proceed with neuropsychological assessment as the  results will be used to aid in differential diagnosis and clinical decision-making and to inform specific treatment recommendations. 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   PLAN: The patient will return for a full battery of neuropsychological testing with a psychometrician under my  supervision. Education regarding testing procedures was provided. In the meantime, we will request records of his testing at [facility].  Subsequently, the patient will see this provider for a follow-up session at which time his test performances and my impressions and treatment recommendations will be reviewed in detail.  Full neuropsychological evaluation report to follow.

## 2017-04-24 ENCOUNTER — Ambulatory Visit (INDEPENDENT_AMBULATORY_CARE_PROVIDER_SITE_OTHER): Payer: Medicaid Other | Admitting: Psychology

## 2017-04-24 DIAGNOSIS — G931 Anoxic brain damage, not elsewhere classified: Secondary | ICD-10-CM

## 2017-04-24 DIAGNOSIS — R413 Other amnesia: Secondary | ICD-10-CM

## 2017-04-24 NOTE — Progress Notes (Signed)
   Neuropsychology Note  Angel [name] returned today for 2 hours of neuropsychological testing with technician, Wallace Keller, BS, under the supervision of Dr. Elvis Coil. The patient did not appear overtly distressed by the testing session, per behavioral observation or via self-report to the technician. Rest breaks were offered. Angel Hacker Dell will return within 2 weeks for a feedback session with Dr. Alinda Dooms at which time his test performances, clinical impressions and treatment recommendations will be reviewed in detail. The patient understands he can contact our office should he require our assistance before this time.  Full report to follow.

## 2017-05-22 ENCOUNTER — Encounter: Payer: Medicaid Other | Admitting: Psychology

## 2017-05-30 NOTE — Progress Notes (Signed)
NEUROPSYCHOLOGICAL EVALUATION   Name:    Angel [name]  Date of Birth:   1966-05-11 Date of Interview:  04/10/2017 Date of Testing:  04/24/2017   Date of Feedback:  05/31/2017       Background Information:  Reason for Referral:  Angel [name] is a 51 y.o. right-handed male referred by Dr. Jannifer Franklin of [facility] to assess his current level of cognitive functioning and assist in differential diagnosis. The current evaluation consisted of a review of available medical records, an interview with the patient, and the completion of a neuropsychological testing battery. Informed consent was obtained.  History of Presenting Problem:  Angel [name] has a history of polysubstance abuse (in remission) with methadone overdose on 12/24/2011 that resulted in cardiac and respiratory arrest, requiring ventilator and then tracheotomy. He had significant cognitive dysfunction following his approximately 3-week hospitalization and while his acute confusion cleared, his memory function never returned to baseline. He does not believe he had any outpatient therapies such as PT/OT/ST. He has been unable to work since his hospitalization (he was previously doing Chiropractor work). He denied any substance abuse or relapse since his hospitalization. He is prescribed tramadol for knee pain but is monitored closely by his pain clinic with regular urine drug tests. He was seen by Dr. Jannifer Franklin for neurologic consultation on 02/21/2017. MMSE was 28/30. The patient has insomnia with restless legs. Mirapex was started by Dr. Jannifer Franklin, and the patient has been taking this. Brain MRI has been ordered but not yet completed. Angel [name] also saw a psychiatrist at the [facility] for evaluation of attention deficits and was prescribed a medication (does not recall the name of it) but he could not tolerate it and d/c'ed it.  Upon direct questioning, the patient reported the following with regard to current cognitive functioning, and notes  that these symptoms were not present until his overdose and cardiac/respiratory arrest in 2013:  Forgetting recent conversations/events: Yes Repeating statements/questions: Yes Misplacing/losing items: Yes Forgetting appointments or other obligations: Little bit Forgetting to take medications: Yes sometimes Difficulty concentrating: Yes Starting but not finishing tasks: Yes Distracted easily: Yes Processing information more slowly: Yes Word-finding difficulty: Little bit Writing difficulty: No Comprehension difficulty: Yes  Angel [name] lives with his mother and his 26 year old daughter. He has not driven since [id number]. He manages his medications independently. His daughter and mother help with management of finances and appointments. He does simple meal preparation. He is unemployed, not on disability currently. His family has reported changes including emotional lability and poor emotional regulation, and significant memory difficulty, since his cardiac arrest.  The patient reported that he continues to have sleep difficulty. He gets about 5-6 hours of interrupted sleep. He does doze off during the day. He denies any major physical complaints. He reports good appetite. He reports that his mood has been good in general. He does go through "stages" of worrying excessively but it is not constant. He denies depressed mood or suicidal ideation/intention.  He denies history of diagnosed concussion or TBI. He did play football and likely had a few mild concussions (no LOC) but never sought medical attention.   Psychiatric History: The patient reported that he has been on Zoloft since around 2014. He has had no other mental health treatment, aside from his recent psychiatric evaluation for attention difficulties (these records were requested and I was able to review them). He denied any history of suicidal ideation or intention.  He has a history of polysubstance  abuse since his early 5s, mostly  opiates. He never had a problem with alcohol. He never participated in any substance abuse treatment programs.   Social History: Born/Raised: [place] Education: 10th grade. He does not think he had any learning difficulties but he was a "class clown" and frequently in trouble for minor issues, never anything major. He did repeat one grade level. 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 He is divorced. Children: 3 daughters, all adults. 1 grandchild. Alcohol: None Tobacco: Smokes about a pack of cigarettes a day, goes back and forth between vape and cigarettes Denies misuse of his tramadol and denies any other substance use.   Medical History:  Past Medical History:  Diagnosis Date  . 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 12/2011  . Aspiration pneumonia ([place])    a. 12/2011 in setting of PEA arrest  . History of bacteremia    a. G. coccobacilli 12/2011  . History of cardiac arrest    a. 12/2011 PEA arrest in setting of drug/etoh use  . History of respiratory failure    a. 12/2011 in setting of PEA  . Nonischemic cardiomyopathy ([place])    a. 12/2011 Echo: EF 20-25% (in setting of PEA arrest);  b. 01/2012 f/u Echo: EF 60-65%, 01/2012 Myoview: EF 58%, inf/infsept scar w/ anteroseptal/inflat ischemia;  d. 01/2012 Cath: NL Cors, EF 60-65%  . Polysubstance abuse    a. 12/2011 UDS + for cocaine, THC  . Pulmonary infiltrate in right lung on chest x-ray    a. 01/2012  w/ rec for outpt f/u cxr  . RLS (restless legs syndrome) 02/21/2017    Current medications:  Outpatient Encounter Prescriptions as of 05/31/2017  Medication Sig  . Aspirin-Acetaminophen-Caffeine (GOODY HEADACHE PO) Take 1 packet by mouth every 8 (eight) hours as needed (headache).  . cephALEXin (KEFLEX) 500 MG capsule Take 1 capsule (500 mg total) by mouth 4 (four) times daily. (Patient not taking: Reported on 11/24/2015)  . cyclobenzaprine (FLEXERIL) 10  MG tablet Take 1 tablet (10 mg total) by mouth at bedtime as needed for muscle spasms. (Patient not taking: Reported on 08/01/2014)  . diclofenac (VOLTAREN) 75 MG EC tablet Take 1 tablet (75 mg total) by mouth 2 (two) times daily as needed. (Patient not taking: Reported on 08/01/2014)  . doxycycline (VIBRA-TABS) 100 MG tablet Take 1 tablet (100 mg total) by mouth 2 (two) times daily. (Patient not taking: Reported on 02/21/2017)  . meloxicam (MOBIC) 7.5 MG tablet Take 1 tablet (7.5 mg total) by mouth 2 (two) times daily after a meal. (Patient not taking: Reported on 11/24/2015)  . methocarbamol (ROBAXIN) 500 MG tablet Take 1 tablet (500 mg total) by mouth 2 (two) times daily. (Patient not taking: Reported on 02/21/2017)  . naproxen (NAPROSYN) 500 MG tablet Take 1 tablet (500 mg total) by mouth 2 (two) times daily. (Patient not taking: Reported on 02/21/2017)  . pramipexole (MIRAPEX) 0.25 MG tablet Take 1 tablet (0.25 mg total) by mouth at bedtime.  . predniSONE (DELTASONE) 10 MG tablet 4 tabs for 2 days, then 3 tabs for 2 days, 2 tabs for 2 days, then 1 tab for 2 days, then stop (Patient not taking: Reported on 02/10/2016)  . sertraline (ZOLOFT) 50 MG tablet Take 1 tablet (50 mg total) by mouth daily.  . traMADol (ULTRAM) 50 MG tablet Take 1 tablet (50 mg total) by mouth every 6 (six) hours as needed.   No  facility-administered encounter medications on file as of 05/31/2017.      Current Examination:  Behavioral Observations:  Appearance: Appropriately dressed and groomed Gait: Ambulated independently, no gross abnormalities observed Speech: Fluent; normal rate, rhythm and volume. No significant word finding difficulty. Thought process: Linear, goal directed Affect: Normal range, euthymic Interpersonal: Pleasant, appropriate Orientation: Oriented to all spheres. Accurately named the current President and his predecessor.   Tests Administered: . Test of Premorbid Functioning (TOPF) . Wechsler  Adult Intelligence Scale-Fourth Edition (WAIS-IV): Similarities, Information, Block Design, Matrix Reasoning, Arithmetic, Symbol Search, Coding and Digit Span subtests . Wechsler Memory Scale-Fourth Edition (WMS-IV) Adult Version (ages 38-69): Logical Memory I, II and Recognition subtests  . [place] Verbal Learning Test - 2nd Edition (CVLT-2) Standard Form . [employer] (WCST) . Repeatable Battery for the Assessment of Neuropsychological Status (RBANS) Form A:  Figure Copy and Figure Recall subtests . Neuropsychological Assessment Battery (NAB) Language Module; Form 1: Naming and [employer] . Controlled Oral Word Association Test (COWAT) . Trail Making Test A and B . Boston Diagnostic Aphasia Examination (BDAE): Commands Subtest . Beck Depression Inventory - Second edition (BDI-II) . Generalized Anxiety Disorder - 7 item screener (GAD-7)  Test Results: Note: Standardized scores are presented only for use by appropriately trained professionals and to allow for any future test-retest comparison. These scores should not be interpreted without consideration of all the information that is contained in the rest of the report. The most recent standardization samples from the test publisher or other sources were used whenever possible to derive standard scores; scores were corrected for age, gender, ethnicity and education when available.   Test Scores:  Test Name Raw Score Standardized Score Descriptor  TOPF 24/70 SS= 82 Low average  WAIS-IV Subtests     Similarities 21/36 ss= 8 Low end of average  Information 10/26 ss= 8 Low end of average  Block Design 32/66 ss= 8 Low end of average  Matrix Reasoning 8/26 ss= 5 Borderline  Arithmetic 11/22 ss= 7 Low average  Symbol Search 19/60 ss= 5 Borderline  Coding 43/135 ss= 6 Low average  Digit Span 19/48 ss= 6 Low average  WAIS-IV Index Scores     Verbal Comprehension  SS= 89 Low average  Perceptual Reasoning  SS= 81 Low  average  Working Memory  SS= 80 Low average  Processing Speed  SS= 76 Borderline  Full Scale IQ (8 subtest)  SS= 77 Borderline  WMS-IV Subtests     LM I 15/50 ss= 5 Borderline  LM II 10/50 ss= 5 Borderline   LM II Recognition 21/30 Cum %: 10-16 Below expectation  CVLT-II Scores     Trial 1 5/16 Z= -1 Low average  Trial 5 9/16 Z= -1 Low average  Trials 1-5 total 38/80 T= 44 Average  SD Free Recall 6/16 Z= -1 Low average  SD Cued Recall 8/16 Z= -1 Low average  LD Free Recall 8/16 Z= -0.5 Average  LD Cued Recall 8/16 Z= -1 Low average  Recognition Discriminability 7/16 hits, 4 false positives Z= -2 Impaired  Forced Choice Recognition 16/16  WNL  WCST     Total Errors 13 T= 53 Average  Perseverative Responses 10 T= 49 Average  Perseverative Errors 7 T= 51 Average  Conceptual Level Responses 48 T= 53 Average  Categories Completed 4 >16% WNL  Trials to Complete 1st Category 12 >16% WNL  Failure to Maintain Set 0  WNL  RBANS Subtests     Figure Copy  19/20 Z= 0.6 Average  Figure Recall 11/20 Z= -0.8 Low average  NAB Language subtests     Naming 31/31 T= 55 Average  Bill Payment 9/19 T= 26 Impaired  COWAT-FAS 31 T= 41 Low average  COWAT-Animals 20 T= 50 Average  BDAE Commands 15/15  WNL  Trail Making Test A  33"  0 errors T= 52 Average  Trail Making Test B  79" 0 errors T= 49 Average  BDI-II 24/63  Moderate  GAD-7 12/21  Moderate     Description of Test Results:  Embedded performance validity indicators were within normal limits. As such, the patient's current performance on neurocognitive testing is judged to be a relatively accurate representation of his current level of neurocognitive functioning.   Premorbid verbal intellectual abilities were estimated to have been within the low average range based on a test of word reading. His current full-scale IQ fell within the borderline range. Among the four indices that comprise the full-scale IQ, verbal comprehension was a  relative strength and processing speed was a relative weakness.   Psychomotor processing speed was borderline. Auditory attention and working memory were barely low average. Visual-spatial construction was variable. Specifically, his manipulation of three dimensional blocks to match two dimensional stimulus models was borderline impaired while his drawn copy of a somewhat complex geometric figure was intact. Language abilities were mostly intact. Specifically, confrontation naming was average, and semantic verbal fluency was average. Auditory comprehension of multi-step commands was intact. On a simulated bill payment task which requires multiple aspects of both receptive and expressive language, his performance was impaired. With regard to verbal memory, encoding and acquisition of non-contextual information (i.e., word list) was average. After an interference task, free recall was low average (6/16 items recalled). With semantic cueing, he recalled an additional two items (low average). After a delay, free recall was average (8/16 items recalled). He did not show any additional benefit from semantic cueing (low average). Performance on a yes/no recognition task was impaired, due to both false positive errors and significantly reduced recognition of target items. On another verbal memory test, encoding and acquisition of contextual auditory information (i.e., short stories) was borderline impaired. After a delay, free recall was borderline impaired. Performance on a yes/no recognition task was below expectation. With regard to non-verbal memory, delayed free recall of visual information was low average. Executive functioning was somewhat variable but largely within expected range. Mental flexibility and set-shifting were average on Trails B. Verbal fluency with phonemic search restrictions was low average. Verbal abstract reasoning was low end of average. Non-verbal abstract reasoning was borderline impaired.  Deductive reasoning was average.   On a self-report measure of mood, the patient's responses were indicative of clinically significant depression at the present time. He denied suicidal ideation or intention. On a self-report measure of anxiety, the patient endorsed symptoms of generalized anxiety as well.   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  Results of formal testing reveal primary deficits in memory encoding and consolidation. Processing speed is slowed as well. Otherwise, most aspects of testing are relatively in line with estimated premorbid intellectual abilities. The hippocampus is often affected in anoxic brain injury which coincides with the nature of memory impairment noted on testing. The most likely cause of Angel [name]'s cognitive deficits is anoxic brain injury from his cardiac arrest in 2013. Because his deficits are significantly affecting his ability to manage complex ADLs and work, criteria are met for a  major neurocognitive disorder.   Recommendations/Plan: I think the patient and his family would benefit significantly from psychological intervention with a neuropsychologist, focused on coping with / compensating for cognitive and behavioral changes related to his injury. The patient and his mother were amenable to this. The patient accepted a referral to Dr. Sima Matas to address behavioral changes, cognitive changes, and family dynamics in light of his brain injury.   Feedback to Patient: Angel [name] and his mother returned for a feedback appointment on 05/31/2017 to review the results of his neuropsychological evaluation with this provider. 15 minutes face-to-face time was spent reviewing his test results, my impressions and my recommendations as detailed above.   A copy of this report was mailed to the patient per his request.  Total time spent on this patient's case: [id number] unit for interview with  psychologist; [phone] units of testing by psychometrician under psychologist's supervision; [phone] units for medical record review, scoring of neuropsychological tests, interpretation of test results, preparation of this report, and review of results to the patient by psychologist.      Thank you for your referral of Angel [name]. Please feel free to contact me if you have any questions or concerns regarding this report.

## 2017-05-31 ENCOUNTER — Encounter: Payer: Self-pay | Admitting: Psychology

## 2017-05-31 ENCOUNTER — Ambulatory Visit (INDEPENDENT_AMBULATORY_CARE_PROVIDER_SITE_OTHER): Payer: Medicaid Other | Admitting: Psychology

## 2017-05-31 ENCOUNTER — Telehealth: Payer: Self-pay | Admitting: Neurology

## 2017-05-31 DIAGNOSIS — G931 Anoxic brain damage, not elsewhere classified: Secondary | ICD-10-CM | POA: Diagnosis not present

## 2017-05-31 DIAGNOSIS — R413 Other amnesia: Secondary | ICD-10-CM

## 2017-05-31 DIAGNOSIS — F3289 Other specified depressive episodes: Secondary | ICD-10-CM

## 2017-05-31 NOTE — Telephone Encounter (Signed)
This evaluation confirms significant cognitive deficits following an anoxic brain injury.   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  Results of formal testing reveal primary deficits in memory encoding and consolidation. Processing speed is slowed as well. Otherwise, most aspects of testing are relatively in line with estimated premorbid intellectual abilities. The hippocampus is often affected in anoxic brain injury which coincides with the nature of memory impairment noted on testing. The most likely cause of Angel [name]'s cognitive deficits is anoxic brain injury from his cardiac arrest in 2013. Because his deficits are significantly affecting his ability to manage complex ADLs and work, criteria are met for a major neurocognitive disorder.   Recommendations/Plan: I think the patient and his family would benefit significantly from psychological intervention with a neuropsychologist, focused on coping with / compensating for cognitive and behavioral changes related to his injury. The patient and his mother were amenable to this. The patient accepted a referral to Dr. Sima Matas to address behavioral changes, cognitive changes, and family dynamics in light of his brain injury.

## 2017-08-09 ENCOUNTER — Encounter: Payer: Medicaid Other | Attending: Psychology | Admitting: Psychology

## 2017-08-10 ENCOUNTER — Encounter: Payer: Medicaid Other | Admitting: Psychology

## 2017-08-22 ENCOUNTER — Ambulatory Visit: Payer: Medicaid Other | Admitting: Neurology

## 2017-08-22 ENCOUNTER — Encounter: Payer: Self-pay | Admitting: Neurology

## 2017-08-22 VITALS — BP 110/70 | HR 73 | Ht 75.0 in | Wt 178.0 lb

## 2017-08-22 DIAGNOSIS — G2581 Restless legs syndrome: Secondary | ICD-10-CM | POA: Diagnosis not present

## 2017-08-22 DIAGNOSIS — R413 Other amnesia: Secondary | ICD-10-CM

## 2017-08-22 DIAGNOSIS — G931 Anoxic brain damage, not elsewhere classified: Secondary | ICD-10-CM

## 2017-08-22 MED ORDER — DONEPEZIL HCL 5 MG PO TABS: 5.0000 mg | ORAL_TABLET | Freq: Every day | ORAL | 1 refills | Status: DC

## 2017-08-22 MED ORDER — PRAMIPEXOLE DIHYDROCHLORIDE 0.25 MG PO TABS: 0.2500 mg | ORAL_TABLET | Freq: Every day | ORAL | 3 refills | Status: DC

## 2017-08-22 NOTE — Patient Instructions (Addendum)
   We will start Aricept for the memory.  Begin Aricept (donepezil) at 5 mg at night for one month. If this medication is well-tolerated, please call our office and we will call in a prescription for the 10 mg tablets. Look out for side effects that may include nausea, diarrhea, weight loss, or stomach cramps. This medication will also cause a runny nose, therefore there is no need for allergy medications for this purpose.    We will get MRI of the brain.

## 2017-08-22 NOTE — Progress Notes (Signed)
Reason for visit: Post anoxic encephalopathy  Angel [name] is an 51 y.o. 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  The patient has not worked since that time.  He reports that his memory has actually worsened over time, he is having increasing cognitive issues.  The patient apparently has had a lot of emotional lability, he is on medications for depression.  The patient has had difficulty following instructions, he has difficulty learning new tasks.  He has undergone formal neuropsychological evaluation that has revealed evidence of memory deficits consistent with post anoxic encephalopathy.  The patient is applying for disability.  The patient has recently been placed on Abilify.  He returns this office for an evaluation.  MRI of the brain was ordered on the last visit, it has not yet been done.  Past Medical History:  Diagnosis Date  . Anoxic encephalopathy (HCC)    a. 12/2011  . Aspiration pneumonia (HCC)    a. 12/2011 in setting of PEA arrest  . History of bacteremia    a. G. coccobacilli 12/2011  . History of cardiac arrest    a. 12/2011 PEA arrest in setting of drug/etoh use  . History of respiratory failure    a. 12/2011 in setting of PEA  . Nonischemic cardiomyopathy (HCC)    a. 12/2011 Echo: EF 20-25% (in setting of PEA arrest);  b. 01/2012 f/u Echo: EF 60-65%, 01/2012 Myoview: EF 58%, inf/infsept scar w/ anteroseptal/inflat ischemia;  d. 01/2012 Cath: NL Cors, EF 60-65%  . Polysubstance abuse    a. 12/2011 UDS + for cocaine, THC  . Pulmonary infiltrate in right lung on chest x-ray    a. 01/2012  w/ rec for outpt f/u cxr  . RLS (restless legs syndrome) 02/21/2017    Past Surgical History:  Procedure Laterality Date  . CARDIAC CATHETERIZATION  5/13   left heart with angiogram  . LEFT HEART CATHETERIZATION WITH CORONARY ANGIOGRAM N/A 01/19/2012   Procedure: LEFT HEART  CATHETERIZATION WITH CORONARY ANGIOGRAM;  Surgeon: Laurey Moralealton S McLean, MD;  [facility];  Service: Cardiovascular;  Laterality: N/A;    Family History  Problem Relation Age of Onset  . Heart disease Father     Social history:  reports that he has been smoking cigarettes.  He has a 35.00 pack-year smoking history. he has never used smokeless tobacco. He reports that he does not drink alcohol or use drugs.   No Known Allergies  Medications:  Prior to Admission medications   Medication Sig Start Date End Date Taking? Authorizing Provider  sertraline (ZOLOFT) 50 MG tablet Take 1 tablet (50 mg total) by mouth daily. 10/09/12  Yes Dyann KiefLenze, Michele M, PA-C  traMADol (ULTRAM) 50 MG tablet Take 1 tablet (50 mg total) by mouth every 6 (six) hours as needed. 12/19/13  Yes Rodolph Bongorey, Evan S, MD  Aspirin-Acetaminophen-Caffeine (GOODY HEADACHE PO) Take 1 packet by mouth every 8 (eight) hours as needed (headache).    [provider]  cephALEXin (KEFLEX) 500 MG capsule Take 1 capsule (500 mg total) by mouth 4 (four) times daily. Patient not taking: Reported on 11/24/2015 08/01/14   Joycie Peekartner, Benjamin, PA-C  cyclobenzaprine (FLEXERIL) 10 MG tablet Take 1 tablet (10 mg total) by mouth at bedtime as needed for muscle spasms. Patient not taking: Reported on 08/01/2014 12/19/13   Rodolph Bongorey, Evan S, MD  diclofenac (VOLTAREN) 75 MG  EC tablet Take 1 tablet (75 mg total) by mouth 2 (two) times daily as needed. Patient not taking: Reported on 08/01/2014 12/19/13   Rodolph Bongorey, Evan S, MD  doxycycline (VIBRA-TABS) 100 MG tablet Take 1 tablet (100 mg total) by mouth 2 (two) times daily. Patient not taking: Reported on 02/21/2017 06/03/16   Fayrene Helperran, Bowie, PA-C  meloxicam (MOBIC) 7.5 MG tablet Take 1 tablet (7.5 mg total) by mouth 2 (two) times daily after a meal. Patient not taking: Reported on 11/24/2015 11/29/14   Linna HoffKindl, James D, MD  methocarbamol (ROBAXIN) 500 MG tablet Take 1 tablet (500 mg total) by mouth 2 (two) times  daily. Patient not taking: Reported on 02/21/2017 02/10/16   Melton Krebsiley, Samantha Nicole, PA-C  naproxen (NAPROSYN) 500 MG tablet Take 1 tablet (500 mg total) by mouth 2 (two) times daily. Patient not taking: Reported on 02/21/2017 06/03/16   Fayrene Helperran, Bowie, PA-C  pramipexole (MIRAPEX) 0.25 MG tablet Take 1 tablet (0.25 mg total) by mouth at bedtime. 02/21/17   York SpanielWillis, Najai Waszak K, MD  predniSONE (DELTASONE) 10 MG tablet 4 tabs for 2 days, then 3 tabs for 2 days, 2 tabs for 2 days, then 1 tab for 2 days, then stop Patient not taking: Reported on 02/10/2016 11/24/15   Parrett, Virgel Bouquetammy S, NP    ROS:  Out of a complete 14 system review of symptoms, the patient complains only of the following symptoms, and all other reviewed systems are negative.  Fatigue Shortness of breath Joint pain, joint swelling, back pain, walking difficulty Agitation, behavior problem, confusion, decreased concentration, depression, anxiety  There were no vitals taken for this visit.  Physical Exam  General: The patient is alert and cooperative at the time of the examination.  Skin: No significant peripheral edema is noted.   Neurologic Exam  Mental status: The patient is alert and oriented x 3 at the time of the examination. The patient has apparent normal recent and remote memory, with an apparently normal attention span and concentration ability.   Cranial nerves: Facial symmetry is present. Speech is normal, no aphasia or dysarthria is noted. Extraocular movements are full. Visual fields are full.  Motor: The patient has good strength in all 4 extremities.  Sensory examination: Soft touch sensation is symmetric on the face, arms, and legs.  Coordination: The patient has good finger-nose-finger and heel-to-shin bilaterally.  Gait and station: The patient has a normal gait. Tandem gait is normal. Romberg is negative. No drift is seen.  Reflexes: Deep tendon reflexes are symmetric.   Assessment/Plan:  1.  Post anoxic  encephalopathy, mild memory deficit  2.  Restless leg syndrome  The patient is getting good improvement with the Mirapex with the restless leg syndrome, I will call in a prescription for this.  The patient indicates that he believes that his memory is worsening over time.  We will follow this.  The patient will be placed on low-dose Aricept.  The MRI of the brain will be reordered.  He will follow-up in 6 months.   Marlan Palau. Keith Chace Bisch MD 08/22/2017 7:51 AM  [facility] [address] [place], [place] [id number]  Phone [phone] Fax [phone]

## 2017-10-13 ENCOUNTER — Ambulatory Visit
Admission: RE | Admit: 2017-10-13 | Discharge: 2017-10-13 | Disposition: A | Discharge status: Home / Self Care | Payer: Medicaid Other | Source: Ambulatory Visit | Attending: Neurology | Admitting: Neurology

## 2017-10-13 DIAGNOSIS — G931 Anoxic brain damage, not elsewhere classified: Secondary | ICD-10-CM

## 2017-10-13 DIAGNOSIS — R413 Other amnesia: Secondary | ICD-10-CM

## 2017-10-14 ENCOUNTER — Telehealth: Payer: Self-pay | Admitting: Neurology

## 2017-10-14 NOTE — Telephone Encounter (Signed)
I called the patient.  The MRI of the brain shows mild cortical atrophy consistent with the history of post anoxic encephalopathy.  No other abnormalities seen.   MRI brain 10/14/17:  IMPRESSION:  Slightly abnormal MRI scan of the brain showing mild supratentorial cortical atrophy and chronic changes of paranasal sinusitis most prominent in the left maxillary sinus

## 2018-02-20 ENCOUNTER — Ambulatory Visit: Payer: Medicaid Other | Admitting: Adult Health

## 2018-07-08 ENCOUNTER — Telehealth: Payer: Self-pay | Admitting: Neurology

## 2018-07-08 ENCOUNTER — Encounter: Payer: Self-pay | Admitting: Adult Health

## 2018-07-08 ENCOUNTER — Ambulatory Visit: Payer: Medicaid Other | Admitting: Adult Health

## 2018-07-08 NOTE — Telephone Encounter (Signed)
Pt was no show to apt today 

## 2018-08-20 ENCOUNTER — Ambulatory Visit: Payer: Medicaid Other | Admitting: Adult Health

## 2018-08-20 ENCOUNTER — Encounter: Payer: Self-pay | Admitting: Adult Health

## 2018-08-20 VITALS — BP 127/76 | HR 77 | Ht 75.0 in | Wt 191.8 lb

## 2018-08-20 DIAGNOSIS — R413 Other amnesia: Secondary | ICD-10-CM | POA: Diagnosis not present

## 2018-08-20 DIAGNOSIS — G931 Anoxic brain damage, not elsewhere classified: Secondary | ICD-10-CM

## 2018-08-20 DIAGNOSIS — G2581 Restless legs syndrome: Secondary | ICD-10-CM

## 2018-08-20 MED ORDER — PRAMIPEXOLE DIHYDROCHLORIDE 0.25 MG PO TABS: 0.2500 mg | ORAL_TABLET | Freq: Every day | ORAL | 3 refills | Status: AC

## 2018-08-20 MED ORDER — DONEPEZIL HCL 10 MG PO TABS: 10.0000 mg | ORAL_TABLET | Freq: Every day | ORAL | 3 refills | Status: AC

## 2018-08-20 NOTE — Progress Notes (Signed)
PATIENT: Angel [name] DOB: 06/27/1966  REASON FOR VISIT: follow up HISTORY FROM: patient  HISTORY OF PRESENT ILLNESS: Today 08/20/18  Angel [name] is a 52 year old male with a history of post anoxic encephalopathy, restless leg syndrome and memory disturbance.  He returns today for follow-up.  He feels that his memory has remained stable.  He currently lives with his daughter.  He is able to complete all ADLs independently.  He does not operate a motor vehicle.  Reports good appetite.  He reports that he quit smoking about 5 months ago.  He sees a psychiatrist for depression.  His daughter manages his finances, medications and appointments.  He reports Mirapex continues to work well for restless legs.  Denies any trouble sleeping.  He returns today for evaluation.  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  The patient has not worked since that time.  He reports that his memory has actually worsened over time, he is having increasing cognitive issues.  The patient apparently has had a lot of emotional lability, he is on medications for depression.  The patient has had difficulty following instructions, he has difficulty learning new tasks.  He has undergone formal neuropsychological evaluation that has revealed evidence of memory deficits consistent with post anoxic encephalopathy.  The patient is applying for disability.  The patient has recently been placed on Abilify.  He returns this office for an evaluation.  MRI of the brain was ordered on the last visit, it has not yet been done.  REVIEW OF SYSTEMS: Out of a complete 14 system review of symptoms, the patient complains only of the following symptoms, and all other reviewed systems are negative.   See HPI  ALLERGIES: No Known Allergies  HOME MEDICATIONS: Outpatient Medications Prior to Visit  Medication Sig Dispense Refill  . donepezil (ARICEPT) 5 MG  tablet Take 1 tablet (5 mg total) by mouth at bedtime. 30 tablet 1  . pramipexole (MIRAPEX) 0.25 MG tablet Take 1 tablet (0.25 mg total) by mouth at bedtime. 90 tablet 3  . sertraline (ZOLOFT) 50 MG tablet Take 1 tablet (50 mg total) by mouth daily. 30 tablet 0  . traMADol (ULTRAM) 50 MG tablet Take 1 tablet (50 mg total) by mouth every 6 (six) hours as needed. (Patient not taking: Reported on 08/20/2018) 15 tablet 0   No facility-administered medications prior to visit.     PAST MEDICAL HISTORY: Past Medical History:  Diagnosis Date  . Anoxic encephalopathy (HCC)    a. 12/2011  . Aspiration pneumonia (HCC)    a. 12/2011 in setting of PEA arrest  . History of bacteremia    a. G. coccobacilli 12/2011  . History of cardiac arrest    a. 12/2011 PEA arrest in setting of drug/etoh use  . History of respiratory failure    a. 12/2011 in setting of PEA  . Nonischemic cardiomyopathy (HCC)    a. 12/2011 Echo: EF 20-25% (in setting of PEA arrest);  b. 01/2012 f/u Echo: EF 60-65%, 01/2012 Myoview: EF 58%, inf/infsept scar w/ anteroseptal/inflat ischemia;  d. 01/2012 Cath: NL Cors, EF 60-65%  . Polysubstance abuse (HCC)    a. 12/2011 UDS + for cocaine, THC  . Pulmonary infiltrate in right lung on chest x-ray    a. 01/2012  w/ rec for outpt f/u cxr  . RLS (restless legs syndrome) 02/21/2017    PAST SURGICAL HISTORY: Past Surgical History:  Procedure Laterality Date  . CARDIAC CATHETERIZATION  5/13   left heart with angiogram  . LEFT HEART CATHETERIZATION WITH CORONARY ANGIOGRAM N/A 01/19/2012   Procedure: LEFT HEART CATHETERIZATION WITH CORONARY ANGIOGRAM;  Surgeon: Laurey Morale, MD;  Location: [facility] CATH LAB;  Service: Cardiovascular;  Laterality: N/A;    FAMILY HISTORY: Family History  Problem Relation Age of Onset  . Heart disease Father     SOCIAL HISTORY: Social History   Socioeconomic History  . Marital status: Single    Spouse name: Not on file  . Number of children: 3  . Years of  education: 31  . Highest education level: Not on file  Occupational History  . Occupation: unemployeed    Employer: Production manager  Social Needs  . Financial resource strain: Not on file  . Food insecurity:    Worry: Not on file    Inability: Not on file  . Transportation needs:    Medical: Not on file    Non-medical: Not on file  Tobacco Use  . Smoking status: Current Every Day Smoker    Packs/day: 1.00    Years: 35.00    Pack years: 35.00    Types: Cigarettes  . Smokeless tobacco: Never Used  Substance and Sexual Activity  . Alcohol use: No  . Drug use: No    Types: Cocaine, Marijuana    Comment: history of cocaine-Methadone-none since 01/2012  . Sexual activity: Yes  Lifestyle  . Physical activity:    Days per week: Not on file    Minutes per session: Not on file  . Stress: Not on file  Relationships  . Social connections:    Talks on phone: Not on file    Gets together: Not on file    Attends religious service: Not on file    Active member of club or organization: Not on file    Attends meetings of clubs or organizations: Not on file    Relationship status: Not on file  . Intimate partner violence:    Fear of current or ex partner: Not on file    Emotionally abused: Not on file    Physically abused: Not on file    Forced sexual activity: Not on file  Other Topics Concern  . Not on file  Social History Narrative   Lives   Caffeine use:       PHYSICAL EXAM  Vitals:   08/20/18 1410  BP: 127/76  Pulse: 77  Weight: 191 lb 12.8 oz (87 kg)  Height: 6\' 3"  (1.905 m)   Body mass index is 23.97 kg/m.   MMSE - Mini Mental State Exam 08/20/2018 02/21/2017  Orientation to time 5 5  Orientation to Place 5 4  Registration 3 3  Attention/ Calculation 4 5  Recall 1 2  Language- name 2 objects 2 2  Language- repeat 1 1  Language- follow 3 step command 3 3  Language- read & follow direction 1 1  Write a sentence 1 1  Copy design 1 1  Total score 27 28      Generalized: Well developed, in no acute distress   Neurological examination  Mentation: Alert oriented to time, place, history taking. Follows all commands speech and language fluent Cranial nerve II-XII: Pupils were equal round reactive to light. Extraocular movements were full, visual field were full on confrontational test. Facial sensation and strength were normal. Uvula tongue midline. Head turning and shoulder shrug  were normal and symmetric. Motor: The motor  testing reveals 5 over 5 strength of all 4 extremities. Good symmetric motor tone is noted throughout.  Sensory: Sensory testing is intact to soft touch on all 4 extremities. No evidence of extinction is noted.  Coordination: Cerebellar testing reveals good finger-nose-finger and heel-to-shin bilaterally.  Gait and station: Gait is normal. Tandem gait is normal. Romberg is negative. No drift is seen.  Reflexes: Deep tendon reflexes are symmetric and normal bilaterally.   DIAGNOSTIC DATA (LABS, IMAGING, TESTING) - I reviewed patient records, labs, notes, testing and imaging myself where available.  Lab Results  Component Value Date   WBC 8.5 02/10/2016   HGB 16.4 02/10/2016   HCT 48.4 02/10/2016   MCV 94.0 02/10/2016   PLT 254 02/10/2016      Component Value Date/Time   NA 138 02/10/2016 1135   K 4.0 02/10/2016 1135   CL 109 02/10/2016 1135   CO2 22 02/10/2016 1135   GLUCOSE 107 (H) 02/10/2016 1135   BUN 24 (H) 02/10/2016 1135   CREATININE 1.14 02/10/2016 1135   CREATININE 1.10 03/20/2014 1503   CALCIUM 9.1 02/10/2016 1135   PROT 6.7 03/20/2014 1503   ALBUMIN 4.4 03/20/2014 1503   AST 18 03/20/2014 1503   ALT 18 03/20/2014 1503   ALKPHOS 60 03/20/2014 1503   BILITOT 0.2 03/20/2014 1503   GFRNONAA >60 02/10/2016 1135   GFRAA >60 02/10/2016 1135   Lab Results  Component Value Date   CHOL 114 01/12/2012   HDL 43 01/12/2012   LDLCALC 57 01/12/2012   TRIG 72 01/12/2012   CHOLHDL 2.7 01/12/2012    Lab  Results  Component Value Date   TSH 0.81 10/09/2012      ASSESSMENT AND PLAN 52 y.o. year old male  has a past medical history of Anoxic encephalopathy (HCC), Aspiration pneumonia (HCC), History of bacteremia, History of cardiac arrest, History of respiratory failure, Nonischemic cardiomyopathy (HCC), Polysubstance abuse (HCC), Pulmonary infiltrate in right lung on chest x-ray, and RLS (restless legs syndrome) (02/21/2017). here with:  1.  Memory disturbance 2.  Restless leg syndrome 3.  Post anoxic encephalopathy  The patient's memory score has been relatively stable.  I will increase Aricept to 10 mg at bedtime.  The patient will remain on Mirapex.  I have advised that if his symptoms worsen or he develops new symptoms he should let us know.  He will follow-up in 6 months or sooner if needed.     Butch Penny, MSN, NP-C 08/20/2018, 2:28 PM [facility] [address] [place], [place] [address] [phone]

## 2018-08-20 NOTE — Patient Instructions (Signed)
Your Plan:  Increase Aricept 10 mg at bedtime Continue Mirapex for restless legs Memory score is stable If your symptoms worsen or you develop new symptoms please let us know.   Thank you for coming [facility] see [facility] at [facility]. I hope we have been able to provide you high quality care today.  You may receive a patient satisfaction survey over the next few weeks. We would appreciate your feedback and comments so that we may continue to improve ourselves and the health of our patients.

## 2018-08-20 NOTE — Progress Notes (Signed)
I have read the note, and I agree with the clinical assessment and plan.  Adaleena Mooers K Kimball Appleby   

## 2018-10-31 ENCOUNTER — Encounter: Payer: Self-pay | Admitting: Neurology

## 2019-02-19 ENCOUNTER — Ambulatory Visit: Payer: Medicaid Other | Admitting: Neurology

## 2023-06-05 ENCOUNTER — Ambulatory Visit
Admission: EM | Admit: 2023-06-05 | Discharge: 2023-06-05 | Disposition: A | Discharge status: Home / Self Care | Payer: Managed Care, Other (non HMO) | Attending: Internal Medicine | Admitting: Internal Medicine

## 2023-06-05 ENCOUNTER — Other Ambulatory Visit: Payer: Self-pay

## 2023-06-05 ENCOUNTER — Encounter: Payer: Self-pay | Admitting: Emergency Medicine

## 2023-06-05 DIAGNOSIS — J340 Abscess, furuncle and carbuncle of nose: Secondary | ICD-10-CM

## 2023-06-05 MED ORDER — DOXYCYCLINE HYCLATE 100 MG PO CAPS: 100.0000 mg | ORAL_CAPSULE | Freq: Two times a day (BID) | ORAL | 0 refills | Status: AC

## 2023-06-05 NOTE — Discharge Instructions (Signed)
You have an abscess in your nose.  I have prescribed antibiotic to treat this.  Take with food to avoid stomach upset.  Follow-up if symptoms persist or worsen.

## 2023-06-05 NOTE — ED Provider Notes (Signed)
[facility]    CSN: [id number] Arrival date & time: 06/05/23  7829      History   Chief Complaint Chief Complaint  Patient presents with   Wound Check    HPI Angel [name] is a 57 y.o. male.   Patient presents with a bump inside his right nostril that started a few days ago.  Reports it is painful and he has had purulent drainage given that he has been squeezing it.  Denies any associated fever.  Denies any injury to the area but thinks that he may have cut the area while shaving.   Wound Check    Past Medical History:  Diagnosis Date   Anoxic encephalopathy (HCC)    a. 12/2011   Aspiration pneumonia (HCC)    a. 12/2011 in setting of PEA arrest   History of bacteremia    a. G. coccobacilli 12/2011   History of cardiac arrest    a. 12/2011 PEA arrest in setting of drug/etoh use   History of respiratory failure    a. 12/2011 in setting of PEA   Nonischemic cardiomyopathy (HCC)    a. 12/2011 Echo: EF 20-25% (in setting of PEA arrest);  b. 01/2012 f/u Echo: EF 60-65%, 01/2012 Myoview: EF 58%, inf/infsept scar w/ anteroseptal/inflat ischemia;  d. 01/2012 Cath: NL Cors, EF 60-65%   Polysubstance abuse (HCC)    a. 12/2011 UDS + for cocaine, THC   Pulmonary infiltrate in right lung on chest x-ray    a. 01/2012  w/ rec for outpt f/u cxr   RLS (restless legs syndrome) 02/21/2017    Patient Active Problem List   Diagnosis Date Noted   RLS (restless legs syndrome) 02/21/2017   Acute bronchitis 11/24/2015   H/O drug dependence/abuse (HCC) 03/20/2014   Tobacco dependence 03/20/2014   Other malaise and fatigue 03/20/2014   Knee pain, bilateral 03/20/2014   Immunization due 03/20/2014   Need for Tdap vaccination 03/20/2014   Spasm of muscle 03/20/2014   Hypersomnia 11/12/2013   Snoring 09/17/2013   Other emphysema (HCC) 09/17/2013   Smoking 09/17/2013   Palpitations 10/09/2012   Chest pain 10/09/2012   Dyspnea 03/15/2012   Smoker 03/15/2012   History of cardiac  arrest    Nonischemic cardiomyopathy (HCC)    Aspiration pneumonia (HCC)    Polysubstance abuse (HCC)    History of respiratory failure    History of bacteremia    Pulmonary infiltrate in right lung on chest x-ray    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   DVT of upper extremity (deep vein thrombosis), left 01/11/2012   Dysphagia 01/11/2012   Cardiomyopathy secondary 01/04/2012   Thrombocytopenia (HCC) 12/31/2011   Acute respiratory failure (HCC) 12/25/2011   Pneumonia, organism unspecified(486) 12/25/2011   Acidosis 12/25/2011   Altered mental status 12/25/2011   Cardiac arrest (HCC) 12/25/2011   Anoxic encephalopathy (HCC) 12/25/2011   No significant past medical history     Past Surgical History:  Procedure Laterality Date   CARDIAC CATHETERIZATION  5/13   left heart with angiogram   LEFT HEART CATHETERIZATION WITH CORONARY ANGIOGRAM N/A 01/19/2012   Procedure: LEFT HEART CATHETERIZATION WITH CORONARY ANGIOGRAM;  Surgeon: Laurey Morale, MD;  Location: [facility] CATH LAB;  Service: Cardiovascular;  Laterality: N/A;       Home Medications    Prior to Admission medications   Medication Sig Start Date End Date Taking? Authorizing Provider  doxycycline (VIBRAMYCIN) 100 MG capsule Take 1 capsule (100 mg total) by mouth 2 (two)  times daily. 06/05/23  Yes , Rolly Salter E, FNP  donepezil (ARICEPT) 10 MG tablet Take 1 tablet (10 mg total) by mouth at bedtime. 08/20/18   Butch Penny, NP  pramipexole (MIRAPEX) 0.25 MG tablet Take 1 tablet (0.25 mg total) by mouth at bedtime. 08/20/18   Butch Penny, NP  sertraline (ZOLOFT) 50 MG tablet Take 1 tablet (50 mg total) by mouth daily. 10/09/12   Dyann Kief, PA-C    Family History Family History  Problem Relation Age of Onset   Heart disease Father     Social History Social History   Tobacco Use   Smoking status: Every Day    Current packs/day: 1.00    Average packs/day: 1 pack/day for 35.0 years (35.0 ttl pk-yrs)    Types:  Cigarettes   Smokeless tobacco: Never  Vaping Use   Vaping status: Some Days  Substance Use Topics   Alcohol use: No   Drug use: No    Types: Cocaine, Marijuana    Comment: history of cocaine-Methadone-none since 01/2012     Allergies   Patient has no known allergies.   Review of Systems Review of Systems Per HPI  Physical Exam Triage Vital Signs ED Triage Vitals [06/05/23 1020]  Encounter Vitals Group     BP 123/71     Systolic BP Percentile      Diastolic BP Percentile      Pulse Rate 73     Resp 18     Temp 98.1 F (36.7 C)     Temp Source Oral     SpO2 95 %     Weight      Height      Head Circumference      Peak Flow      Pain Score 4     Pain Loc      Pain Education      Exclude from Growth Chart    No data found.  Updated Vital Signs BP 123/71 (BP Location: Left Arm)   Pulse 73   Temp 98.1 F (36.7 C) (Oral)   Resp 18   SpO2 95%   Visual Acuity Right Eye Distance:   Left Eye Distance:   Bilateral Distance:    Right Eye Near:   Left Eye Near:    Bilateral Near:     Physical Exam Constitutional:      General: He is not in acute distress.    Appearance: Normal appearance. He is not toxic-appearing or diaphoretic.  HENT:     Head: Normocephalic and atraumatic.     Nose:      Comments: Patient has approximately 1 cm in diameter indurated abscess present directly inside the nasal passage opening.  No purulent drainage noted at this time.  No surrounding swelling. Nasal passage is patent.  Eyes:     Extraocular Movements: Extraocular movements intact.     Conjunctiva/sclera: Conjunctivae normal.  Pulmonary:     Effort: Pulmonary effort is normal.  Neurological:     General: No focal deficit present.     Mental Status: He is alert and oriented to person, place, and time. Mental status is at baseline.  Psychiatric:        Mood and Affect: Mood normal.        Behavior: Behavior normal.        Thought Content: Thought content normal.         Judgment: Judgment normal.      UC Treatments / Results  Labs (  all labs ordered are listed, but only abnormal results are displayed) Labs Reviewed - No data to display  EKG   Radiology No results found.  Procedures Procedures (including critical care time)  Medications Ordered in UC Medications - No data to display  Initial Impression / Assessment and Plan / UC Course  I have reviewed the triage vital signs and the nursing notes.  Pertinent labs & imaging results that were available during my care of the patient were reviewed by me and considered in my medical decision making (see chart for details).     Patient has nasal abscess.  Will treat with doxycycline.  No I&D indicated.  Advised patient to monitor closely and follow-up if it persists or worsens.  Patient verbalized understanding and was agreeable with plan. Final Clinical Impressions(s) / UC Diagnoses   Final diagnoses:  Nasal abscess     Discharge Instructions      You have an abscess in your nose.  I have prescribed antibiotic to treat this.  Take with food to avoid stomach upset.  Follow-up if symptoms persist or worsen.    ED Prescriptions     Medication Sig Dispense Auth. Provider   doxycycline (VIBRAMYCIN) 100 MG capsule Take 1 capsule (100 mg total) by mouth 2 (two) times daily. 20 capsule Gustavus Bryant, [place]      PDMP not reviewed this encounter.   Gustavus Bryant, [place] 06/05/23 [id number]

## 2023-06-05 NOTE — ED Triage Notes (Signed)
Pt sts "bump" on the right side of his face with swelling and pain x 3 days; pt concerned for possible infection
